# Patient Record
Sex: Male | Born: 1935 | Race: White | Hispanic: No | Marital: Married | Smoking: Former smoker
Health system: Southern US, Community
[De-identification: ages and names within clinical notes are randomized; demographics above are authoritative.]

## PROBLEM LIST (undated history)

## (undated) DIAGNOSIS — E785 Hyperlipidemia, unspecified: Secondary | ICD-10-CM

## (undated) DIAGNOSIS — I639 Cerebral infarction, unspecified: Secondary | ICD-10-CM

## (undated) DIAGNOSIS — I2510 Atherosclerotic heart disease of native coronary artery without angina pectoris: Secondary | ICD-10-CM

## (undated) DIAGNOSIS — J90 Pleural effusion, not elsewhere classified: Secondary | ICD-10-CM

## (undated) DIAGNOSIS — K219 Gastro-esophageal reflux disease without esophagitis: Secondary | ICD-10-CM

## (undated) DIAGNOSIS — I609 Nontraumatic subarachnoid hemorrhage, unspecified: Secondary | ICD-10-CM

## (undated) DIAGNOSIS — I10 Essential (primary) hypertension: Secondary | ICD-10-CM

## (undated) DIAGNOSIS — I739 Peripheral vascular disease, unspecified: Secondary | ICD-10-CM

## (undated) DIAGNOSIS — I480 Paroxysmal atrial fibrillation: Secondary | ICD-10-CM

## (undated) DIAGNOSIS — I679 Cerebrovascular disease, unspecified: Secondary | ICD-10-CM

## (undated) DIAGNOSIS — R001 Bradycardia, unspecified: Secondary | ICD-10-CM

## (undated) HISTORY — DX: Essential (primary) hypertension: I10

## (undated) HISTORY — DX: Cerebrovascular disease, unspecified: I67.9

## (undated) HISTORY — DX: Pleural effusion, not elsewhere classified: J90

## (undated) HISTORY — DX: Cerebral infarction, unspecified: I63.9

## (undated) HISTORY — DX: Atherosclerotic heart disease of native coronary artery without angina pectoris: I25.10

## (undated) HISTORY — DX: Peripheral vascular disease, unspecified: I73.9

## (undated) HISTORY — DX: Gastro-esophageal reflux disease without esophagitis: K21.9

## (undated) HISTORY — DX: Nontraumatic subarachnoid hemorrhage, unspecified: I60.9

## (undated) HISTORY — PX: OTHER SURGICAL HISTORY: SHX169

## (undated) HISTORY — DX: Hyperlipidemia, unspecified: E78.5

---

## 1998-03-01 ENCOUNTER — Ambulatory Visit (HOSPITAL_COMMUNITY): Admission: RE | Admit: 1998-03-01 | Discharge: 1998-03-01 | Payer: Self-pay | Admitting: Cardiology

## 2001-03-24 ENCOUNTER — Ambulatory Visit (HOSPITAL_COMMUNITY): Admission: RE | Admit: 2001-03-24 | Discharge: 2001-03-24 | Payer: Self-pay | Admitting: Gastroenterology

## 2001-08-09 ENCOUNTER — Encounter: Payer: Self-pay | Admitting: Emergency Medicine

## 2001-08-09 ENCOUNTER — Emergency Department (HOSPITAL_COMMUNITY): Admission: EM | Admit: 2001-08-09 | Discharge: 2001-08-09 | Payer: Self-pay | Admitting: Emergency Medicine

## 2001-08-14 ENCOUNTER — Ambulatory Visit (HOSPITAL_COMMUNITY): Admission: RE | Admit: 2001-08-14 | Discharge: 2001-08-14 | Payer: Self-pay | Admitting: Internal Medicine

## 2001-08-14 ENCOUNTER — Encounter: Payer: Self-pay | Admitting: Internal Medicine

## 2004-09-14 HISTORY — PX: OTHER SURGICAL HISTORY: SHX169

## 2004-09-15 ENCOUNTER — Ambulatory Visit: Payer: Self-pay | Admitting: Cardiovascular Disease

## 2004-09-15 ENCOUNTER — Encounter: Payer: Self-pay | Admitting: Emergency Medicine

## 2004-09-15 ENCOUNTER — Inpatient Hospital Stay (HOSPITAL_COMMUNITY): Admission: RE | Admit: 2004-09-15 | Discharge: 2004-09-21 | Payer: Self-pay | Admitting: Neurology

## 2004-09-18 ENCOUNTER — Encounter: Payer: Self-pay | Admitting: Cardiology

## 2004-09-20 ENCOUNTER — Encounter (INDEPENDENT_AMBULATORY_CARE_PROVIDER_SITE_OTHER): Payer: Self-pay | Admitting: Specialist

## 2006-01-25 ENCOUNTER — Ambulatory Visit: Payer: Self-pay | Admitting: Cardiology

## 2006-02-13 ENCOUNTER — Ambulatory Visit: Payer: Self-pay | Admitting: Cardiology

## 2006-02-18 ENCOUNTER — Inpatient Hospital Stay (HOSPITAL_BASED_OUTPATIENT_CLINIC_OR_DEPARTMENT_OTHER): Admission: RE | Admit: 2006-02-18 | Discharge: 2006-02-18 | Payer: Self-pay | Admitting: Cardiovascular Disease

## 2006-02-18 ENCOUNTER — Ambulatory Visit: Payer: Self-pay | Admitting: Cardiovascular Disease

## 2006-02-21 ENCOUNTER — Ambulatory Visit: Payer: Self-pay | Admitting: Cardiology

## 2006-03-04 ENCOUNTER — Ambulatory Visit: Payer: Self-pay

## 2006-03-14 ENCOUNTER — Encounter: Admission: RE | Admit: 2006-03-14 | Discharge: 2006-03-14 | Payer: Self-pay | Admitting: Vascular Surgery

## 2006-03-18 ENCOUNTER — Inpatient Hospital Stay (HOSPITAL_COMMUNITY): Admission: RE | Admit: 2006-03-18 | Discharge: 2006-03-23 | Payer: Self-pay | Admitting: Cardiothoracic Surgery

## 2006-03-18 HISTORY — PX: CORONARY ARTERY BYPASS GRAFT: SHX141

## 2006-04-03 ENCOUNTER — Ambulatory Visit: Payer: Self-pay | Admitting: *Deleted

## 2006-04-23 ENCOUNTER — Ambulatory Visit: Payer: Self-pay | Admitting: Cardiology

## 2006-06-27 ENCOUNTER — Ambulatory Visit: Payer: Self-pay | Admitting: Cardiology

## 2007-01-28 ENCOUNTER — Ambulatory Visit: Payer: Self-pay | Admitting: Cardiology

## 2007-04-04 IMAGING — CR DG CHEST 2V
2 series · 2 of 2 positions shown · non-contrast
Comparison: Portable study 03/20/06.

CLINICAL DATA: Postop CABG.
 CHEST - 2 VIEW:

[w chest pa]
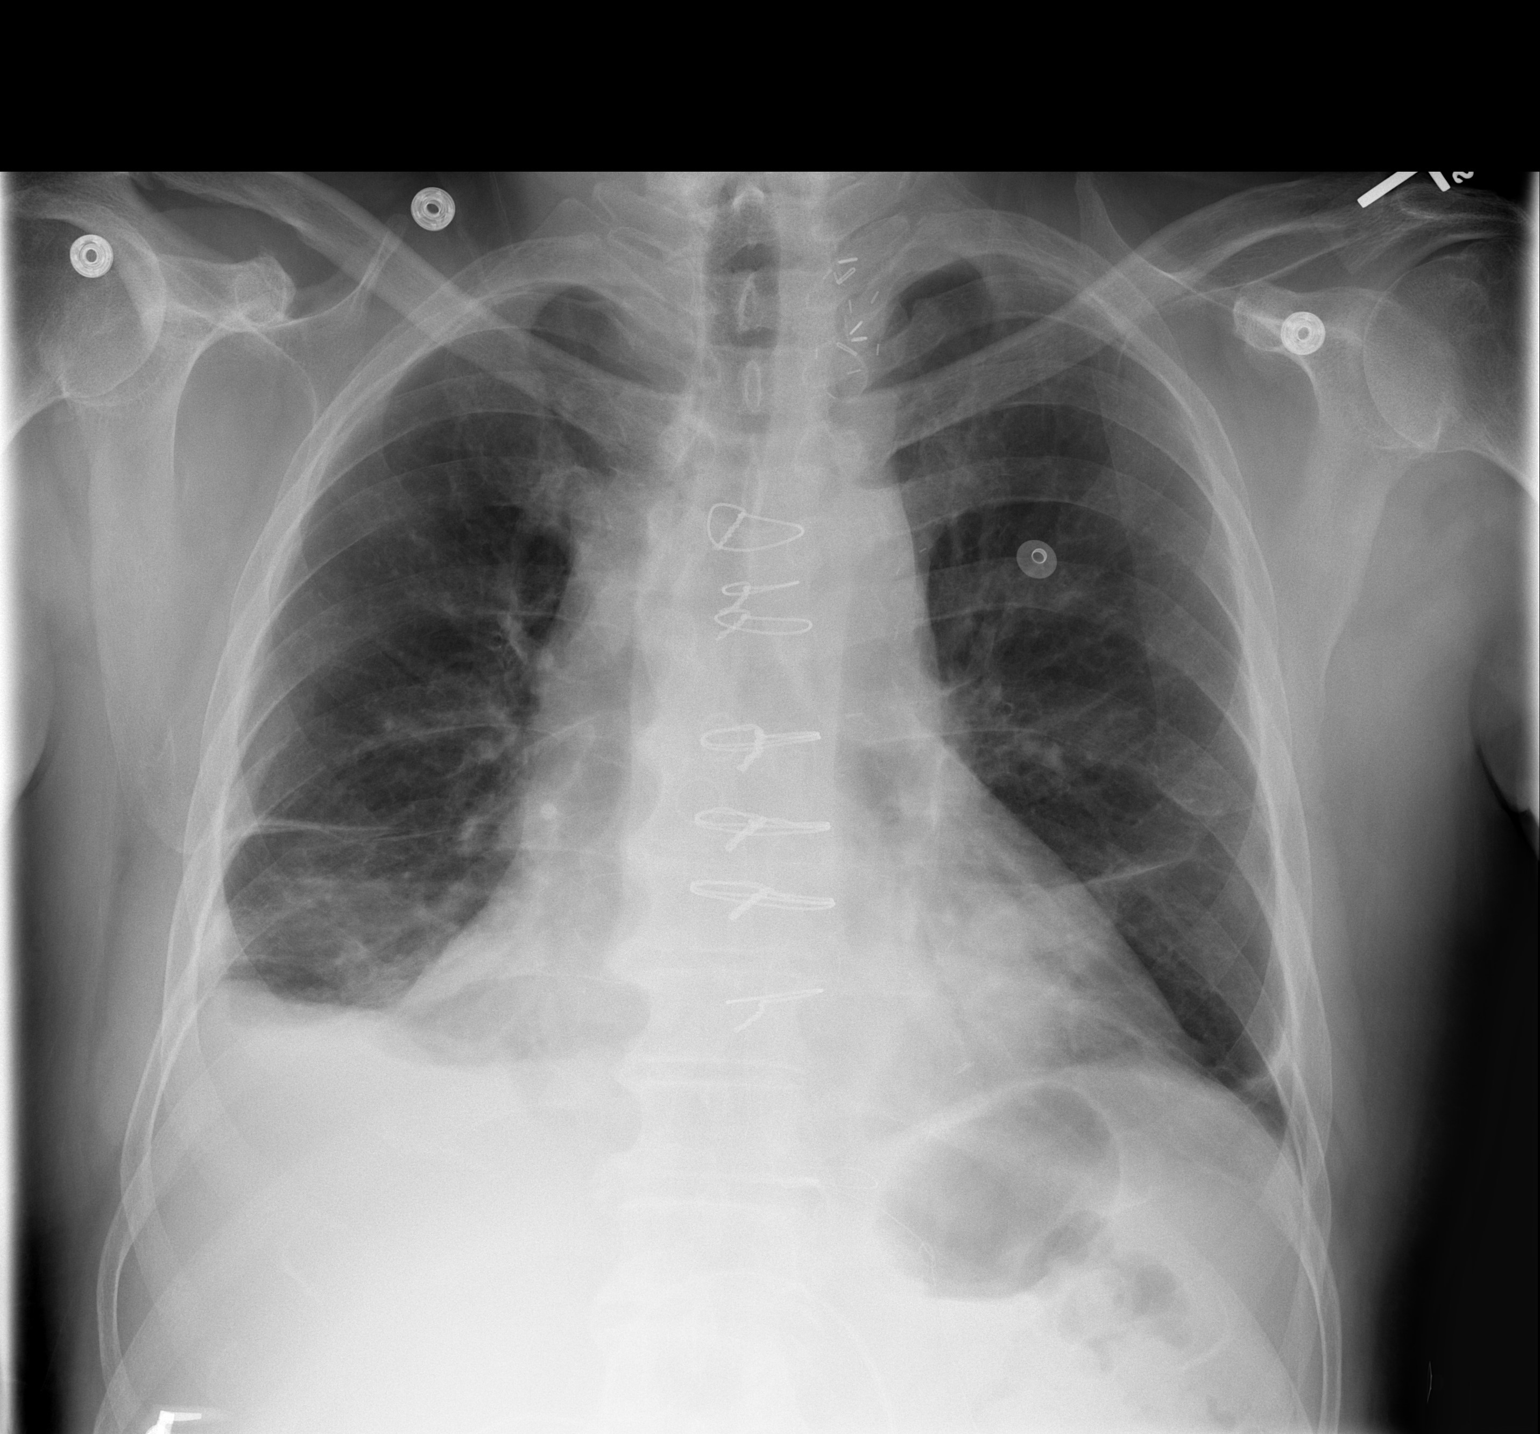

[w chest lat]
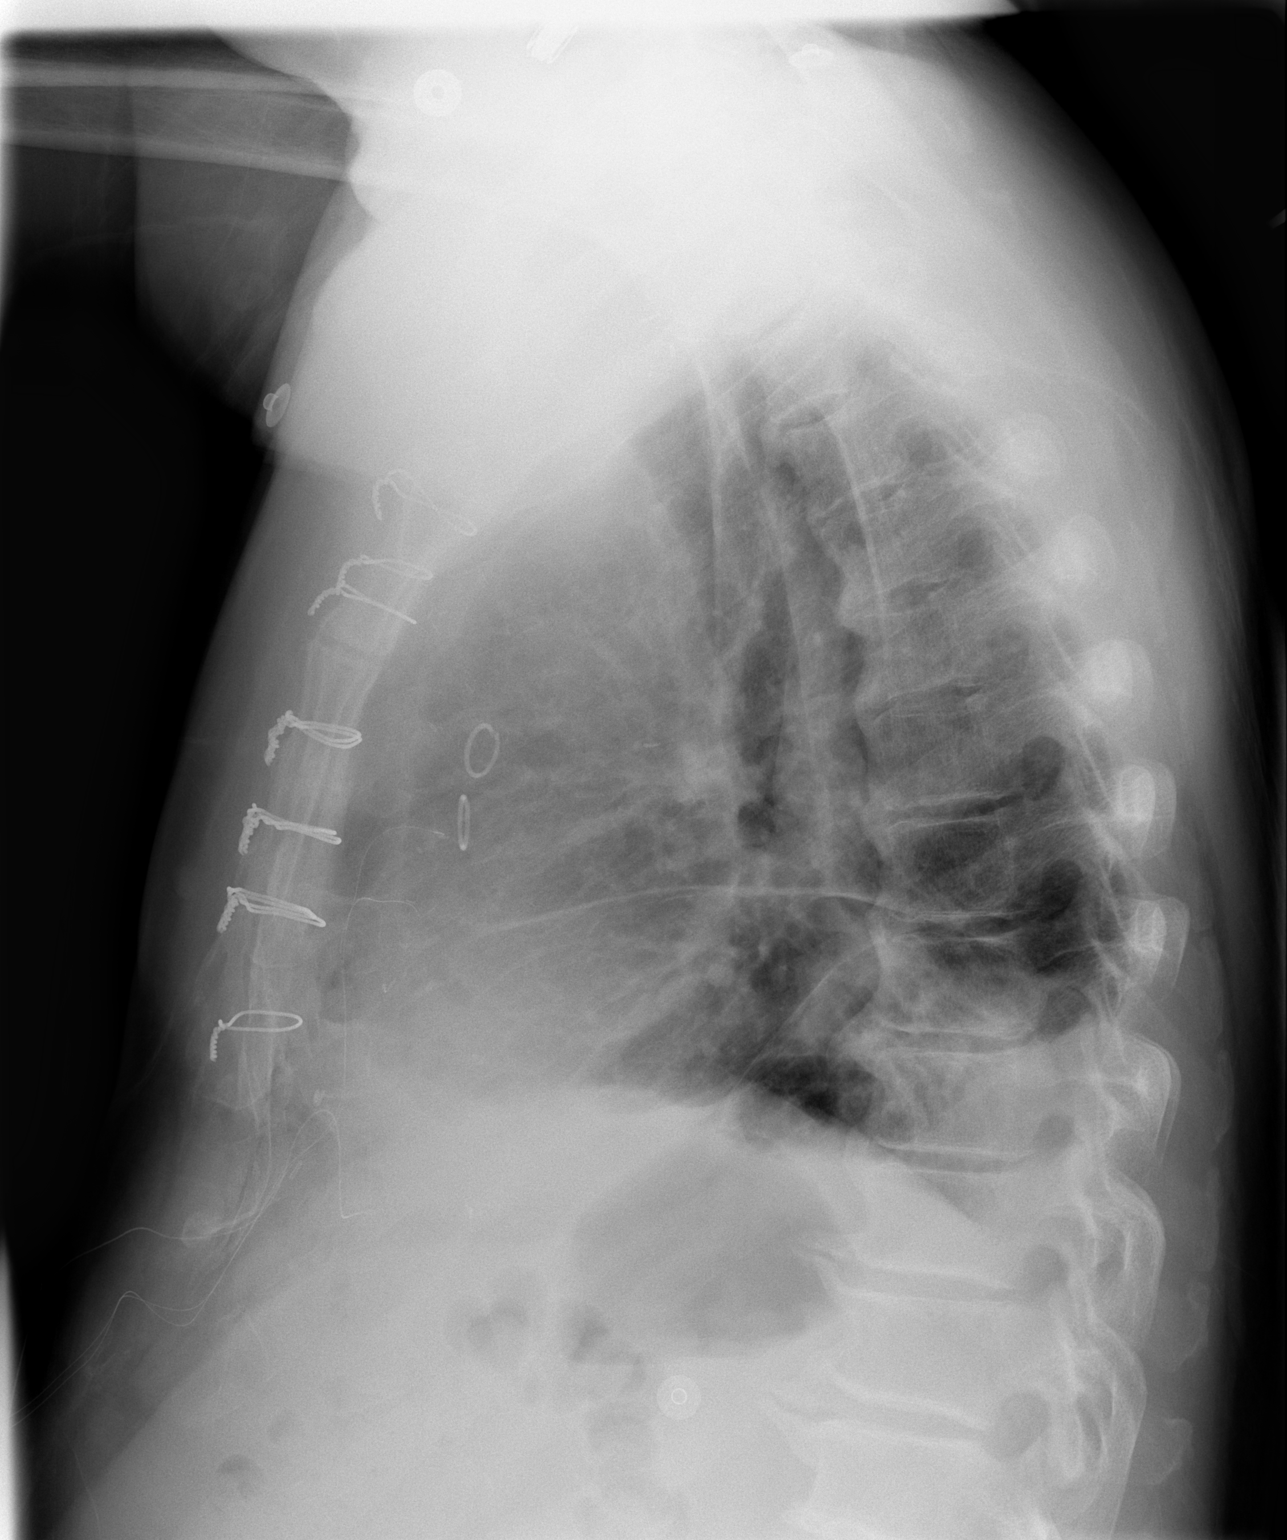

[2 of 2 positions shown; findings below may reference images not displayed]

FINDINGS: Small right pleural effusion and bibasilar atelectasis are redemonstrated.  There is no pneumothorax or edema.  The cardiomediastinal contours appear stable status post recent median sternotomy and CABG.
IMPRESSION: Stable postoperative chest with bibasilar atelectasis and small right pleural effusion.

## 2007-08-14 ENCOUNTER — Ambulatory Visit: Payer: Self-pay | Admitting: Cardiology

## 2007-09-02 ENCOUNTER — Ambulatory Visit: Payer: Self-pay

## 2008-03-05 ENCOUNTER — Ambulatory Visit: Payer: Self-pay | Admitting: Cardiology

## 2008-09-02 ENCOUNTER — Ambulatory Visit: Payer: Self-pay | Admitting: Cardiology

## 2008-09-02 ENCOUNTER — Ambulatory Visit: Payer: Self-pay

## 2008-11-16 ENCOUNTER — Encounter (INDEPENDENT_AMBULATORY_CARE_PROVIDER_SITE_OTHER): Payer: Self-pay | Admitting: Diagnostic Radiology

## 2008-11-16 ENCOUNTER — Ambulatory Visit (HOSPITAL_COMMUNITY): Admission: RE | Admit: 2008-11-16 | Discharge: 2008-11-16 | Payer: Self-pay | Admitting: Internal Medicine

## 2008-11-22 ENCOUNTER — Ambulatory Visit (HOSPITAL_COMMUNITY): Admission: RE | Admit: 2008-11-22 | Discharge: 2008-11-22 | Payer: Self-pay | Admitting: Internal Medicine

## 2008-11-26 ENCOUNTER — Ambulatory Visit (HOSPITAL_COMMUNITY): Admission: RE | Admit: 2008-11-26 | Discharge: 2008-11-26 | Payer: Self-pay | Admitting: Internal Medicine

## 2008-12-01 ENCOUNTER — Ambulatory Visit: Payer: Self-pay | Admitting: Thoracic Surgery

## 2008-12-03 ENCOUNTER — Ambulatory Visit: Admission: RE | Admit: 2008-12-03 | Discharge: 2008-12-03 | Payer: Self-pay | Admitting: Thoracic Surgery

## 2008-12-06 ENCOUNTER — Encounter: Payer: Self-pay | Admitting: Thoracic Surgery

## 2008-12-06 ENCOUNTER — Ambulatory Visit (HOSPITAL_COMMUNITY): Admission: RE | Admit: 2008-12-06 | Discharge: 2008-12-06 | Payer: Self-pay | Admitting: Thoracic Surgery

## 2008-12-06 ENCOUNTER — Ambulatory Visit: Payer: Self-pay | Admitting: Thoracic Surgery

## 2008-12-08 ENCOUNTER — Ambulatory Visit: Payer: Self-pay | Admitting: Internal Medicine

## 2008-12-08 ENCOUNTER — Ambulatory Visit: Payer: Self-pay | Admitting: Thoracic Surgery

## 2008-12-22 ENCOUNTER — Encounter: Payer: Self-pay | Admitting: Cardiology

## 2008-12-22 ENCOUNTER — Ambulatory Visit: Payer: Self-pay | Admitting: Cardiology

## 2008-12-22 ENCOUNTER — Encounter: Admission: RE | Admit: 2008-12-22 | Discharge: 2008-12-22 | Payer: Self-pay | Admitting: Thoracic Surgery

## 2008-12-22 ENCOUNTER — Ambulatory Visit: Payer: Self-pay | Admitting: Thoracic Surgery

## 2009-01-26 ENCOUNTER — Ambulatory Visit: Payer: Self-pay | Admitting: Thoracic Surgery

## 2009-01-26 ENCOUNTER — Encounter: Admission: RE | Admit: 2009-01-26 | Discharge: 2009-01-26 | Payer: Self-pay | Admitting: Thoracic Surgery

## 2009-02-16 ENCOUNTER — Telehealth: Payer: Self-pay | Admitting: Cardiology

## 2009-03-01 ENCOUNTER — Ambulatory Visit: Payer: Self-pay | Admitting: Cardiology

## 2009-03-03 ENCOUNTER — Ambulatory Visit: Payer: Self-pay | Admitting: Internal Medicine

## 2009-03-03 DIAGNOSIS — R0602 Shortness of breath: Secondary | ICD-10-CM | POA: Insufficient documentation

## 2009-03-03 DIAGNOSIS — I440 Atrioventricular block, first degree: Secondary | ICD-10-CM | POA: Insufficient documentation

## 2009-03-03 DIAGNOSIS — I442 Atrioventricular block, complete: Secondary | ICD-10-CM | POA: Insufficient documentation

## 2009-03-11 ENCOUNTER — Ambulatory Visit: Payer: Self-pay | Admitting: Cardiology

## 2009-03-11 ENCOUNTER — Encounter: Payer: Self-pay | Admitting: Internal Medicine

## 2009-03-16 ENCOUNTER — Telehealth: Payer: Self-pay | Admitting: Cardiology

## 2009-03-16 ENCOUNTER — Encounter: Payer: Self-pay | Admitting: Internal Medicine

## 2009-03-23 ENCOUNTER — Ambulatory Visit: Payer: Self-pay | Admitting: Internal Medicine

## 2009-03-23 ENCOUNTER — Ambulatory Visit (HOSPITAL_COMMUNITY): Admission: RE | Admit: 2009-03-23 | Discharge: 2009-03-24 | Payer: Self-pay | Admitting: Internal Medicine

## 2009-03-24 ENCOUNTER — Encounter: Payer: Self-pay | Admitting: Internal Medicine

## 2009-04-08 ENCOUNTER — Ambulatory Visit: Payer: Self-pay | Admitting: Internal Medicine

## 2009-04-20 ENCOUNTER — Telehealth (INDEPENDENT_AMBULATORY_CARE_PROVIDER_SITE_OTHER): Payer: Self-pay

## 2009-04-21 ENCOUNTER — Ambulatory Visit: Payer: Self-pay

## 2009-04-21 ENCOUNTER — Encounter: Payer: Self-pay | Admitting: Internal Medicine

## 2009-05-03 ENCOUNTER — Encounter: Payer: Self-pay | Admitting: Internal Medicine

## 2009-07-08 ENCOUNTER — Ambulatory Visit: Payer: Self-pay | Admitting: Internal Medicine

## 2009-07-08 DIAGNOSIS — J90 Pleural effusion, not elsewhere classified: Secondary | ICD-10-CM | POA: Insufficient documentation

## 2009-07-08 DIAGNOSIS — I609 Nontraumatic subarachnoid hemorrhage, unspecified: Secondary | ICD-10-CM | POA: Insufficient documentation

## 2009-07-08 DIAGNOSIS — I679 Cerebrovascular disease, unspecified: Secondary | ICD-10-CM | POA: Insufficient documentation

## 2009-07-08 DIAGNOSIS — I10 Essential (primary) hypertension: Secondary | ICD-10-CM | POA: Insufficient documentation

## 2009-07-08 DIAGNOSIS — K219 Gastro-esophageal reflux disease without esophagitis: Secondary | ICD-10-CM | POA: Insufficient documentation

## 2009-07-08 DIAGNOSIS — E785 Hyperlipidemia, unspecified: Secondary | ICD-10-CM | POA: Insufficient documentation

## 2009-07-08 DIAGNOSIS — H409 Unspecified glaucoma: Secondary | ICD-10-CM | POA: Insufficient documentation

## 2009-07-08 DIAGNOSIS — I25810 Atherosclerosis of coronary artery bypass graft(s) without angina pectoris: Secondary | ICD-10-CM | POA: Insufficient documentation

## 2009-07-08 DIAGNOSIS — I739 Peripheral vascular disease, unspecified: Secondary | ICD-10-CM | POA: Insufficient documentation

## 2009-11-04 ENCOUNTER — Ambulatory Visit: Payer: Self-pay | Admitting: Internal Medicine

## 2009-11-04 DIAGNOSIS — I4891 Unspecified atrial fibrillation: Secondary | ICD-10-CM | POA: Insufficient documentation

## 2010-01-11 ENCOUNTER — Encounter: Payer: Self-pay | Admitting: Internal Medicine

## 2010-01-12 ENCOUNTER — Telehealth: Payer: Self-pay | Admitting: Internal Medicine

## 2010-01-19 ENCOUNTER — Ambulatory Visit: Payer: Self-pay | Admitting: Internal Medicine

## 2010-01-19 DIAGNOSIS — I5031 Acute diastolic (congestive) heart failure: Secondary | ICD-10-CM | POA: Insufficient documentation

## 2010-02-01 ENCOUNTER — Ambulatory Visit: Payer: Self-pay | Admitting: Thoracic Surgery

## 2010-02-28 ENCOUNTER — Ambulatory Visit: Payer: Self-pay | Admitting: Internal Medicine

## 2010-03-02 ENCOUNTER — Telehealth: Payer: Self-pay | Admitting: Internal Medicine

## 2010-05-30 ENCOUNTER — Telehealth: Payer: Self-pay | Admitting: Internal Medicine

## 2010-06-06 ENCOUNTER — Ambulatory Visit: Payer: Self-pay

## 2010-06-06 ENCOUNTER — Encounter: Payer: Self-pay | Admitting: Internal Medicine

## 2010-09-11 ENCOUNTER — Telehealth: Payer: Self-pay | Admitting: Internal Medicine

## 2010-09-12 ENCOUNTER — Ambulatory Visit: Payer: Self-pay | Admitting: Internal Medicine

## 2010-09-13 ENCOUNTER — Telehealth (INDEPENDENT_AMBULATORY_CARE_PROVIDER_SITE_OTHER): Payer: Self-pay | Admitting: *Deleted

## 2010-09-26 ENCOUNTER — Encounter: Payer: Self-pay | Admitting: Internal Medicine

## 2010-09-26 ENCOUNTER — Ambulatory Visit: Payer: Self-pay

## 2010-10-19 ENCOUNTER — Ambulatory Visit
Admission: RE | Admit: 2010-10-19 | Discharge: 2010-10-19 | Payer: Self-pay | Source: Home / Self Care | Attending: Internal Medicine | Admitting: Internal Medicine

## 2010-11-14 NOTE — Assessment & Plan Note (Signed)
Summary: cp-mainly w/activity   Visit Type:  PPM-[employer] Primary Provider:  dr Marveen Reeks  CC:  no complaints.  History of Present Illness: Frank Weeks is seen in followup for coronary artery disease prior bypass surgery and intermittent angina. Most recently he had acute diastolic heart failure related to inappropriate reversion of his pacemaker from the DDD mode to the VVI mode; this had to be adjusted with computer rebooting  He continues to have exertional chest discomfort.this is largely with exertion but not reproducibly so. Has also occasionally accompanied by lightheadedness. He can be aggravated by activity of his upper arms.   a Myoview in 2009demonstrated EF:  56 % QGS cine images:  Normal wall motion.: Abnormal adenosine nuclear study with prior inferolateral infarct at the base and mild peri-infarct ischemia.   Echocardiogram 2010 demonstrated normal left ventricular function      Problems Prior to Update: 1)  Diastolic Heart Failure, Acute  (ICD-428.31) 2)  Atrial Fibrillation  (ICD-427.31) 3)  Cad, Artery Bypass Graft  (ICD-414.04) 4)  Pacemaker, Permanent  (ICD-V45.01) 5)  Peripheral Vascular Disease  (ICD-443.9) 6)  Hypertension, Unspecified  (ICD-401.9) 7)  Dyslipidemia  (ICD-272.4) 8)  Subarachnoid Hemorrhage  (ICD-430) 9)  Cerebrovascular Disease  (ICD-437.9) 10)  Gastroesophageal Reflux Disease  (ICD-530.81) 11)  Second Degree Av Block, Mobitz II  (ICD-426.12) 12)  Av Block, 1st Degree  (ICD-426.11) 13)  Shortness of Breath  (ICD-786.05) 14)  Glaucoma  (ICD-365.9) 15)  Pleural Effusion  (ICD-511.9)  Current Medications (verified): 1)  Aspirin Ec 325 Mg Tbec (Aspirin) .... Take One Tablet By Mouth Daily 2)  Crestor 20 Mg Tabs (Rosuvastatin Calcium) .... Take One Tablet Three Times A Week 3)  Fish Oil  Oil (Fish Oil) .... Take As Directed 4)  Nitroglycerin 0.4 Mg Subl (Nitroglycerin) .... One Tablet Under Tongue Every 5 Minutes As Needed For Chest Pain---May  Repeat Times Three 5)  Altace 10 Mg Tabs (Ramipril) .... Take One Tablet Once Daily 6)  Travatan 0.004 % Soln (Travoprost) .Marland Kitchen.. 1 Gtt Ou Once Daily 7)  Xibrom 0.09 % Soln (Bromfenac Sodium) .Marland Kitchen.. 1 Gtt Two Times A Day Ou 8)  Vitamin B-12 100 Mcg Tabs (Cyanocobalamin) .... Once Daily  Allergies (verified): 1)  ! Lipitor (Atorvastatin)  Past History:  Past Medical History: Last updated: 2009/08/03 Current Problems:  CAD, ARTERY BYPASS GRAFT (ICD-414.04)-1977 ISCHEMIC HEART DISEASE (ICD-414.9) PACEMAKER, PERMANENT (ICD-V45.01) Dual-chamber PERIPHERAL VASCULAR DISEASE (ICD-443.9) HYPERTENSION, UNSPECIFIED (ICD-401.9) DYSLIPIDEMIA (ICD-272.4) SUBARACHNOID HEMORRHAGE (ICD-430)HX OF CEREBROVASCULAR DISEASE (ICD-437.9)Extracranial GASTROESOPHAGEAL REFLUX DISEASE (ICD-530.81) SECOND DEGREE AV BLOCK, MOBITZ II (ICD-426.12) AV BLOCK, 1ST DEGREE (ICD-426.11) SHORTNESS OF BREATH (ICD-786.05)  History of stroke in 1988. GLAUCOMA (ICD-365.9) PLEURAL EFFUSION (ICD-511.9)  Past Surgical History: Last updated: 08/03/2009  coronary artery   bypass graft surgery, March 18, 2006.   right carotid endarterectomy, December 2005.   left carotid to vertebral bypass   Dual-chamber pacemaker implantation.   thoracentesis and bronchoscopy   Family History: Last updated: 08-03-09  His mother died at 71 from myocardial infarction and had  lung cancer.  His father died at 69 from myocardial infarction.  He has a brother, 59.  He has a twin brother, 14, who has a heart condition.  There is positive family history of tremor.  Social History: Last updated: 03-Aug-2009  He is retired from working in [employer].  He does not drink alcohol nor does he smoke cigarettes.  Vital Signs:  Patient profile:   77 year old male Height:  69 inches Weight:      178 pounds BMI:     26.38 Pulse rate:   76 / minute BP sitting:   163 / 84  (right arm) Cuff size:   regular  Vitals Entered By: Caralee Ates CMA  (September 12, 2010 12:11 PM)  Physical Exam  General:  The patient was alert and oriented in no acute distress. HEENT Normal.  Neck veins were flat, carotids were brisk.  Lungs were clear.  Heart sounds were regular with 2/6 systolic murmur heard both at the apex and at the base Abdomen was soft with active bowel sounds. There is no clubbing cyanosis or edema. Skin Warm and dry alert and oriented    PPM Specifications Following MD:  Sherryl Manges, MD     PPM Vendor:  [employer]     PPM Model Number:  [id number]     PPM Serial Number:  [id number] PPM DOI:  03/23/2009     PPM Implanting MD:  Sherryl Manges, MD  Lead 1    Location: RA     DOI: 03/23/2009     Model #: [id number]     Serial #: [id number]     Status: active Lead 2    Location: RV     DOI: 03/23/2009     Model #: [id number]     Serial #: [id number]     Status: active  Magnet Response Rate:  BOL 100 ERI  85    PPM Follow Up Remote Check?  Yes Battery Voltage:  2.95 V     Battery Est. Longevity:  7.1 years     Pacer Dependent:  Yes       PPM Device Measurements Atrium  Amplitude: 5.0 mV, Impedance: 450 ohms,  Right Ventricle  Amplitude: 10.5 mV, Impedance: 530 ohms, Threshold: 0.75 V at 0.4 msec  Episodes MS Episodes:  1947     Percent Mode Switch:  7.7%     Coumadin:  No Ventricular High Rate:  15%     Atrial Pacing:  100%      Parameters Mode:  DDD     Lower Rate Limit:  60     Upper Rate Limit:  120 Paced AV Delay:  200     Sensed AV Delay:  180 Next Cardiology Appt Due:  02/13/2011 Tech Comments:  No parameter changes.  A-fib with ventricular rates controlled, - coumadin.  ROV 6 months clinic. Altha Harm, LPN  September 12, 2010 12:28 PM   Impression & Recommendations:  Problem # 1:  ATRIAL FIBRILLATION (ICD-427.31) the patient has paroxysms of atrial fibrillation. It is possible that these correlate with his symptoms. We'll undertake an event recorder to try to clarify this.  He has a high CHADS VASC score, but has a history  of a subarachnoid hemorrhage and GI bleeding; hence he is not a candidate for anticoagulative therapy. His updated medication list for this problem includes:    Aspirin Ec 325 Mg Tbec (Aspirin) .Marland Kitchen... Take one tablet by mouth daily  Problem # 2:  CAD, ARTERY BYPASS GRAFT (ICD-414.04)  he is a chronic chest pain following his bypass surgery. It is possible that as this is largely exertional that there is a true ischemic component. I suspect more likely that it is a mechanical think that's related to chest wall scarring. I've asked him to consider whether he would be willing to undergo catheterization; we will review that at his next visit His updated medication list for this problem includes:  Aspirin Ec 325 Mg Tbec (Aspirin) .Marland Kitchen... Take one tablet by mouth daily    Nitroglycerin 0.4 Mg Subl (Nitroglycerin) ..... One tablet under tongue every 5 minutes as needed for chest pain---may repeat times three    Altace 10 Mg Tabs (Ramipril) .Marland Kitchen... Take one tablet once daily  His updated medication list for this problem includes:    Aspirin Ec 325 Mg Tbec (Aspirin) .Marland Kitchen... Take one tablet by mouth daily    Nitroglycerin 0.4 Mg Subl (Nitroglycerin) ..... One tablet under tongue every 5 minutes as needed for chest pain---may repeat times three    Altace 10 Mg Tabs (Ramipril) .Marland Kitchen... Take one tablet once daily  Problem # 3:  PACEMAKER, PERMANENT (ICD-V45.01) Device parameters and data were reviewed and no changes were made  Problem # 4:  AV BLOCK, 1ST DEGREE (ICD-426.11)  at this point he is ventricularly paced 100% of the time. RA excursion seemed quite adequate His updated medication list for this problem includes:    Aspirin Ec 325 Mg Tbec (Aspirin) .Marland Kitchen... Take one tablet by mouth daily    Nitroglycerin 0.4 Mg Subl (Nitroglycerin) ..... One tablet under tongue every 5 minutes as needed for chest pain---may repeat times three    Altace 10 Mg Tabs (Ramipril) .Marland Kitchen... Take one tablet once daily  Orders: Event  (Event)  His updated medication list for this problem includes:    Aspirin Ec 325 Mg Tbec (Aspirin) .Marland Kitchen... Take one tablet by mouth daily    Nitroglycerin 0.4 Mg Subl (Nitroglycerin) ..... One tablet under tongue every 5 minutes as needed for chest pain---may repeat times three    Altace 10 Mg Tabs (Ramipril) .Marland Kitchen... Take one tablet once daily  Patient Instructions: 1)  Your physician recommends that you schedule a follow-up appointment in: 5 weeks 2)  Your physician recommends that you continue on your current medications as directed. Please refer to the Current Medication list given to you today. 3)  Your physician has recommended that you wear an event monitor.  Event monitors are medical devices that record the heart's electrical activity. Doctors most often use these monitors to diagnose arrhythmias. Arrhythmias are problems with the speed or rhythm of the heartbeat. The monitor is a small, portable device. You can wear one while you do your normal daily activities. This is usually used to diagnose what is causing palpitations/syncope (passing out).

## 2010-11-14 NOTE — Cardiovascular Report (Signed)
Summary: Office Visit   Office Visit   Imported By: Roderic Ovens 06/27/2010 12:44:14  _____________________________________________________________________  External Attachment:    Type:   Image     Comment:   External Document

## 2010-11-14 NOTE — Progress Notes (Signed)
Summary: does have box to check pacer at home/pt called again  Phone Note Outgoing Call Call back at [facility] Phone [phone]   Caller: Patient Reason for Call: Talk to Nurse Summary of Call: per pt caling, does have a box to check his pacer at home.  Initial call taken by: Lorne Skeens,  May 30, 2010 12:12 PM Summary of Call: spoke w/pt and prefers at this time to come in office for device check.  Scheduled pt for 06-06-10 @ 1030.  pt needed tues appt. Vella Kohler  May 30, 2010 3:34 PM  Follow-up for Phone Call        pt needs someone to call him he will not be able to do transmission because he dosen't have a box to send it and he wants to talk to someone

## 2010-11-14 NOTE — Procedures (Signed)
Summary: Cardiology Device Clinic   Allergies: 1)  ! Lipitor (Atorvastatin)  PPM Specifications Following MD:  Sherryl Manges, MD     PPM Vendor:  [employer]     PPM Model Number:  [id number]     PPM Serial Number:  [id number] PPM DOI:  03/23/2009     PPM Implanting MD:  Sherryl Manges, MD  Lead 1    Location: RA     DOI: 03/23/2009     Model #: [id number]     Serial #: [id number]     Status: active Lead 2    Location: RV     DOI: 03/23/2009     Model #: [id number]     Serial #: [id number]     Status: active  Magnet Response Rate:  BOL 100 ERI  85    PPM Follow Up Remote Check?  No Battery Voltage:  2.96 V     Battery Est. Longevity:  8.4 years     Pacer Dependent:  No       PPM Device Measurements Atrium  Amplitude: 4.1 mV, Impedance: 430 ohms, Threshold: 1.0 V at 0.4 msec Right Ventricle  Amplitude: 10.8 mV, Impedance: 490 ohms, Threshold: 0.5 V at 0.4 msec  Episodes Coumadin:  No  Parameters Mode:  DDD     Lower Rate Limit:  60     Upper Rate Limit:  120 Paced AV Delay:  200     Sensed AV Delay:  180 Next Cardiology Appt Due:  03/15/2010 Tech Comments:  Checked by [employer].  AF suppreession on, max rate 120.  ROV 6/11 with Dr. Graciela Husbands. Altha Harm, LPN  November 04, 2009 12:08 PM    Patient Instructions: 1)  Your physician recommends that you schedule a follow-up appointment in: 03/25/2010

## 2010-11-14 NOTE — Progress Notes (Signed)
Summary: Pt having problems with heart all pt wife would say  Phone Note Call from Patient Call back at Home Phone [phone]   Caller: Spouse/ Frank Weeks Summary of Call: Pt having moore problems with heart thats all wife would say Initial call taken by: Judie Grieve,  September 11, 2010 2:04 PM  Follow-up for Phone Call        11/28 1432-rna at pt home. Frank Gladden, RN, BSN spoke w/pt wife and she states that he is having chest pain more often and having to take nitro.  usually one does it.  she is unable to pinpoint how often it happens-could be several times in a week or could skip a week. she felt that the problem happens with activity.  she and he are insistent on seeing Dr. Graciela Husbands for the problem.  She also states that he has not felt well recently.  Adv spouse that if pain unrelieved by nitro x 3 to call 911. She expressed understanding.  Worked pt in tomorrow at noon. Follow-up by: Frank Gladden RN,  September 11, 2010 4:11 PM

## 2010-11-14 NOTE — Cardiovascular Report (Signed)
Summary: Office Visit   Office Visit   Imported By: Roderic Ovens 03/07/2010 16:01:01  _____________________________________________________________________  External Attachment:    Type:   Image     Comment:   External Document

## 2010-11-14 NOTE — Assessment & Plan Note (Signed)
Summary: 1030/sjm pacer check  needed tues appt   Current Medications (verified): 1)  Aspirin Ec 325 Mg Tbec (Aspirin) .... Take One Tablet By Mouth Daily 2)  Crestor 20 Mg Tabs (Rosuvastatin Calcium) .... Take One Tablet Three Times A Week 3)  Fish Oil  Oil (Fish Oil) .... Take As Directed 4)  Nitroglycerin 0.4 Mg Subl (Nitroglycerin) .... One Tablet Under Tongue Every 5 Minutes As Needed For Chest Pain---May Repeat Times Three 5)  Altace 10 Mg Tabs (Ramipril) .... Take One Tablet Once Daily 6)  Travatan 0.004 % Soln (Travoprost) .Marland Kitchen.. 1 Gtt Ou Once Daily 7)  Xibrom 0.09 % Soln (Bromfenac Sodium) .Marland Kitchen.. 1 Gtt Two Times A Day Ou 8)  Vitamin B-12 100 Mcg Tabs (Cyanocobalamin) .... Once Daily  Allergies (verified): 1)  ! Lipitor (Atorvastatin)   PPM Specifications Following MD:  Sherryl Manges, MD     PPM Vendor:  [employer]     PPM Model Number:  [id number]     PPM Serial Number:  [id number] PPM DOI:  03/23/2009     PPM Implanting MD:  Sherryl Manges, MD  Lead 1    Location: RA     DOI: 03/23/2009     Model #: [id number]     Serial #: [id number]     Status: active Lead 2    Location: RV     DOI: 03/23/2009     Model #: [id number]     Serial #: [id number]     Status: active  Magnet Response Rate:  BOL 100 ERI  85    PPM Follow Up Battery Voltage:  2.95 V     Battery Est. Longevity:  7 yrs     Pacer Dependent:  No       PPM Device Measurements Atrium  Amplitude: 5.0 mV, Impedance: 400 ohms, Threshold: 1.0 V at 0.4 msec Right Ventricle  Amplitude: 10.5 mV, Impedance: 540 ohms, Threshold: 0.75 V at 0.4 msec  Episodes MS Episodes:  1202     Percent Mode Switch:  4.4%     Coumadin:  No Ventricular High Rate:  0     Atrial Pacing:  13%     Ventricular Pacing:  >99%  Parameters Mode:  DDD     Lower Rate Limit:  60     Upper Rate Limit:  120 Paced AV Delay:  200     Sensed AV Delay:  180 Next Cardiology Appt Due:  11/15/2010 Tech Comments:  1202 AMS EPISODES--4.4% OF TIME. - COUMADIN.  NORMAL DEVICE FUNCTION.   NO CHANGES MADE. ROV IN 6 MTHS W/SK. Vella Kohler  June 06, 2010 10:45 AM

## 2010-11-14 NOTE — Progress Notes (Signed)
Summary: holter monitor  Phone Note Call from Patient Call back at Home Phone [phone]   Caller: Patient Reason for Call: Talk to Nurse Summary of Call: pt does not want to do the holter monitor Initial call taken by: Migdalia Dk,  Mar 02, 2010 8:58 AM  Follow-up for Phone Call        fine Follow-up by: Nathen May, MD, [facility],  Mar 08, 2010 5:35 PM

## 2010-11-14 NOTE — Progress Notes (Signed)
Summary: fluid build up  Phone Note Call from Patient Call back at [facility] Phone [phone]   Caller: Spouse Reason for Call: Talk to Nurse Summary of Call: request to speak to nurse, PCP found fluid around his heart, thinks he should be Initial call taken by: Migdalia Dk,  January 12, 2010 4:22 PM  Follow-up for Phone Call        Spoke with pt's wife. She would like to make an appointmet for pt. to see Dr. Graciela Husbands. Pt has fluid around his heart according to his PCP. An appointment was made for April 7th at 3:15 PM. Pt's wife aware. Follow-up by: Ollen Gross, RN, BSN,  January 12, 2010 5:10 PM

## 2010-11-14 NOTE — Progress Notes (Signed)
Summary: holter monitor  Phone Note Outgoing Call   Call placed by: Marcos Eke,  September 13, 2010 11:08 AM Summary of Call: Call pt for appt.for monitor ,left messege for him to call to sch.  Follow-up for Phone Call        Frank Weeks call back and said he would like to wait until he see Dr Graciela Husbands on 10/19/10 before he have this monitor put on. Follow-up by: Marcos Eke,  September 15, 2010 8:48 AM     Appended Document: holter monitor spoke w/pt and explained why Dr. Graciela Husbands wanted the monitor on and he is agreeable. Confirmed w/Ruth that monitor available and left msg for pt to call in to set up time to get monitor put on. Claris Gladden, RN, BSN

## 2010-11-14 NOTE — Assessment & Plan Note (Signed)
Summary: f6w   Primary Provider:  dr Marveen Reeks  CC:  6 week follow up.  Marland Kitchen  History of Present Illness: Frank Weeks is seen in followup for coronary artery disease prior bypass surgery and intermittent angina. Most recently he had acute diastolic heart failure related to inappropriate reversion of his pacemaker from the DDD mode to the VVI mode; this had to be adjusted with computer rebooting  He continues to have exertional chest discomfort. Catheterization  in 2007 after he which he underwent bypass grafting x3.  a Myoview in 2009demonstrated EF:  56 % QGS cine images:  Normal wall motion.: Abnormal adenosine nuclear study with prior inferolateral infarct at the base and mild peri-infarct ischemia.   Echocardiogram 2010 demonstrated normal left ventricular function    He is much improved following reprogramming of his device. There have been no palpitations or shortness of breath    Current Medications (verified): 1)  Aspirin Ec 325 Mg Tbec (Aspirin) .... Take One Tablet By Mouth Daily 2)  Crestor 20 Mg Tabs (Rosuvastatin Calcium) .... Take One Tablet Three Times A Week 3)  Fish Oil  Oil (Fish Oil) .... Take As Directed 4)  Nitroglycerin 0.4 Mg Subl (Nitroglycerin) .... One Tablet Under Tongue Every 5 Minutes As Needed For Chest Pain---May Repeat Times Three 5)  Altace 10 Mg Tabs (Ramipril) .... Take One Tablet Once Daily 6)  Travatan 0.004 % Soln (Travoprost) .Marland Kitchen.. 1 Gtt Ou Once Daily 7)  Xibrom 0.09 % Soln (Bromfenac Sodium) .Marland Kitchen.. 1 Gtt Two Times A Day Ou 8)  Vitamin D 1000 Unit Tabs (Cholecalciferol) .... Once Daily 9)  Vitamin B-12 100 Mcg Tabs (Cyanocobalamin) .... Once Daily  Allergies (verified): 1)  ! Lipitor (Atorvastatin)  Past History:  Past Medical History: Last updated: July 24, 2009 Current Problems:  CAD, ARTERY BYPASS GRAFT (ICD-414.04)-1977 ISCHEMIC HEART DISEASE (ICD-414.9) PACEMAKER, PERMANENT (ICD-V45.01) Dual-chamber PERIPHERAL VASCULAR DISEASE  (ICD-443.9) HYPERTENSION, UNSPECIFIED (ICD-401.9) DYSLIPIDEMIA (ICD-272.4) SUBARACHNOID HEMORRHAGE (ICD-430)HX OF CEREBROVASCULAR DISEASE (ICD-437.9)Extracranial GASTROESOPHAGEAL REFLUX DISEASE (ICD-530.81) SECOND DEGREE AV BLOCK, MOBITZ II (ICD-426.12) AV BLOCK, 1ST DEGREE (ICD-426.11) SHORTNESS OF BREATH (ICD-786.05)  History of stroke in 1988. GLAUCOMA (ICD-365.9) PLEURAL EFFUSION (ICD-511.9)  Past Surgical History: Last updated: 24-Jul-2009  coronary artery   bypass graft surgery, March 18, 2006.   right carotid endarterectomy, December 2005.   left carotid to vertebral bypass   Dual-chamber pacemaker implantation.   thoracentesis and bronchoscopy   Family History: Last updated: 07/24/2009  His mother died at 71 from myocardial infarction and had  lung cancer.  His father died at 30 from myocardial infarction.  He has a brother, 21.  He has a twin brother, 63, who has a heart condition.  There is positive family history of tremor.  Social History: Last updated: 2009/07/24  He is retired from working in [employer].  He does not drink alcohol nor does he smoke cigarettes.  Vital Signs:  Patient profile:   77 year old male Height:      69 inches Weight:      173 pounds BMI:     25.64 Pulse rate:   84 / minute Pulse rhythm:   regular BP sitting:   126 / 80  (left arm) Cuff size:   regular  Vitals Entered By: Judithe Modest CMA (Feb 28, 2010 2:27 PM)  Physical Exam  General:  The patient was alert and oriented in no acute distress. HEENT Normal.  Neck veins were flat, carotids were brisk.  Lungs were clear.  Heart sounds were regular  with 2/6 systolic murmur heard both at the apex and at the base Abdomen was soft with active bowel sounds. There is no clubbing cyanosis or edema. Skin Warm and dry alert and oriented    PPM Specifications Following MD:  Sherryl Manges, MD     PPM Vendor:  [employer] Jude     PPM Model Number:  [id number]     PPM Serial Number:  [id number] DOI:   03/23/2009     PPM Implanting MD:  Sherryl Manges, MD  Lead 1    Location: [facility]     DOI: 03/23/2009     Model #: [id number]     Serial #: [id number]     Status: active Lead 2    Location: [facility]     DOI: 03/23/2009     Model #: [id number]     Serial #: [id number]     Status: active  Magnet Response Rate:  BOL 100 ERI  85    PPM Follow Up Battery Voltage:  2.96 V     Battery Est. Longevity:  7.7 yrs     Pacer Dependent:  No       PPM Device Measurements Atrium  Amplitude: 5.0 mV, Impedance: 410 ohms, Threshold: 1.0 V at 0.4 msec Right Ventricle  Amplitude: 11.4 mV, Impedance: 490 ohms, Threshold: 0.75 V at 0.4 msec  Episodes MS Episodes:  469     Percent Mode Switch:  2.7%     Coumadin:  No Ventricular High Rate:  0     Atrial Pacing:  6.1%     Ventricular Pacing:  99%  Parameters Mode:  DDD     Lower Rate Limit:  60     Upper Rate Limit:  120 Paced AV Delay:  200     Sensed AV Delay:  180 Next Cardiology Appt Due:  08/15/2010 Tech Comments:  LONGEST MODE SWITCH 2 HRS 33 MINUTES.  NORMAL DEVICE FUNCTION. CHANGED A AMPLITUDE TO 2 V. PT IS NOT ON COUMADIN.  Vella Kohler  Feb 28, 2010 2:48 PM  Impression & Recommendations:  Problem # 1:  PACEMAKER, PERMANENT (ICD-V45.01) Device parameters and data were reviewed and no changes were made  Problem # 2:  ATRIAL FIBRILLATION (ICD-427.31) the device continues to take atrial fibrillation the longest episode which is one hour or so. We will continue to monitor it at this time continue him on aspirin for thrombolic risk reduction His updated medication list for this problem includes:    Aspirin Ec 325 Mg Tbec (Aspirin) .Marland Kitchen... Take one tablet by mouth daily  Problem # 3:  CAD, ARTERY BYPASS GRAFT (ICD-414.04) He continues to have exertional chest discomfort. I suggested that he take pre-exercise nitroglycerin His updated medication list for this problem includes:    Aspirin Ec 325 Mg Tbec (Aspirin) .Marland Kitchen... Take one tablet by mouth daily    Nitroglycerin  0.4 Mg Subl (Nitroglycerin) ..... One tablet under tongue every 5 minutes as needed for chest pain---may repeat times three    Altace 10 Mg Tabs (Ramipril) .Marland Kitchen... Take one tablet once daily  Problem # 4:  DIASTOLIC HEART FAILURE, ACUTE (ICD-428.31) relatively stable on current medication His updated medication list for this problem includes:    Aspirin Ec 325 Mg Tbec (Aspirin) .Marland Kitchen... Take one tablet by mouth daily    Nitroglycerin 0.4 Mg Subl (Nitroglycerin) ..... One tablet under tongue every 5 minutes as needed for chest pain---may repeat times three    Altace 10 Mg Tabs (Ramipril) .Marland Kitchen... Take  one tablet once daily  Patient Instructions: 1)  Your physician wants you to follow-up in: 6 months with device clinic.  You will receive a reminder letter in the mail two months in advance. If you don't receive a letter, please call our office to schedule the follow-up appointment. 2)  Your physician recommends that you continue on your current medications as directed. Please refer to the Current Medication list given to you today.

## 2010-11-14 NOTE — Assessment & Plan Note (Signed)
Summary: Fluid around heart per PCP   Primary Provider:  dr Marveen Reeks  CC:  fluid around heart per PCP. Pt also has pneumonia.  Frank Weeks  History of Present Illness: Frank Weeks is seen in followup for coronary artery disease with prior bypass and chronic intermittent angina. He was also felt to have symptomatic second-degree heart block and bradycardia and for this he underwent pacemaker implantation earlier this year. This has been associated with only a modest improvement. Echocardiogram 2010 demonstrated normal left ventricular function previous three-vessel disease by catheter in 2007 and underwent bypass.  He comes in today at the request of Dr. Sherrye Payor because of progressive and rapidly so penis weakness accompanied by dyspnea on exertion and exercise intolerance. Blood testing had demonstrated an elevated BNP; chest x-ray had raised the possibility of heart failure versus pneumonia there were some chills which seem to corroborate the latter diagnosis.   H        Current Medications (verified): 1)  Aspirin Ec 325 Mg Tbec (Aspirin) .... Take One Tablet By Mouth Daily 2)  Crestor 20 Mg Tabs (Rosuvastatin Calcium) .Frank Weeks.. 1 Tab Qd 3)  Fish Oil  Oil (Fish Oil) .... Take As Directed 4)  Nitroglycerin 0.4 Mg Subl (Nitroglycerin) .... One Tablet Under Tongue Every 5 Minutes As Needed For Chest Pain---May Repeat Times Three 5)  Altace 10 Mg Tabs (Ramipril) .... Take One Tablet Once Daily 6)  Travatan 0.004 % Soln (Travoprost) .Frank Weeks.. 1 Gtt Ou Once Daily 7)  Xibrom 0.09 % Soln (Bromfenac Sodium) .Frank Weeks.. 1 Gtt Two Times A Day Ou 8)  Avelox 400 Mg Tabs (Moxifloxacin Hcl) .... Take One Tablet Daily For Seven Days 9)  Align  Caps (Probiotic Product) .... Tablet Everyday 10)  Proventil Hfa 108 (90 Base) Mcg/act Aers (Albuterol Sulfate) .... Use 2 Puffs A Day Every 4 Hours 11)  Asmanex 30 Metered Doses 220 Mcg/inh Aepb (Mometasone Furoate) .... Uad  Allergies (verified): 1)  ! Lipitor (Atorvastatin)  Past  History:  Past Medical History: Last updated: Jul 26, 2009 Current Problems:  CAD, ARTERY BYPASS GRAFT (ICD-414.04)-1977 ISCHEMIC HEART DISEASE (ICD-414.9) PACEMAKER, PERMANENT (ICD-V45.01) Dual-chamber PERIPHERAL VASCULAR DISEASE (ICD-443.9) HYPERTENSION, UNSPECIFIED (ICD-401.9) DYSLIPIDEMIA (ICD-272.4) SUBARACHNOID HEMORRHAGE (ICD-430)HX OF CEREBROVASCULAR DISEASE (ICD-437.9)Extracranial GASTROESOPHAGEAL REFLUX DISEASE (ICD-530.81) SECOND DEGREE AV BLOCK, MOBITZ II (ICD-426.12) AV BLOCK, 1ST DEGREE (ICD-426.11) SHORTNESS OF BREATH (ICD-786.05)  History of stroke in 1988. GLAUCOMA (ICD-365.9) PLEURAL EFFUSION (ICD-511.9)  Past Surgical History: Last updated: 07/26/2009  coronary artery   bypass graft surgery, March 18, 2006.   right carotid endarterectomy, December 2005.   left carotid to vertebral bypass   Dual-chamber pacemaker implantation.   thoracentesis and bronchoscopy   Family History: Last updated: 26-Jul-2009  His mother died at 15 from myocardial infarction and had  lung cancer.  His father died at 85 from myocardial infarction.  He has a brother, 50.  He has a twin brother, 26, who has a heart condition.  There is positive family history of tremor.  Social History: Last updated: Jul 26, 2009  He is retired from working in [employer].  He does not drink alcohol nor does he smoke cigarettes.  Vital Signs:  Patient profile:   77 year old male Height:      69 inches Weight:      172 pounds BMI:     25.49 Pulse rate:   66 / minute Pulse rhythm:   regular BP sitting:   103 / 71  (right arm) Cuff size:   large  Vitals Entered By: Frank Folks  Weeks CMA (January 19, 2010 3:25 PM)  Physical Exam  General:  Well developed, well nourished, in no acute distress. Head:  Normal HEENT Neck:  x-rays were 7 and 8 cm , neck supple Lungs:  clear to auscultation Heart:  regular rate and rhythm with variable S1 and Cannon A. waves Abdomen:  soft nontender active bowel Msk:  Back  normal, normal gait. Muscle strength and tone normal. Extremities:  no clubbing cyanosis or edema Neurologic:  Alert and oriented x 3. Skin:  warm and dry Psych:  depressed affect.     PPM Specifications Following MD:  Sherryl Manges, MD     PPM Vendor:  [employer]     PPM Model Number:  [id number]     PPM Serial Number:  [id number] DOI:  03/23/2009     PPM Implanting MD:  Sherryl Manges, MD  Lead 1    Location: [facility]     DOI: 03/23/2009     Model #: [id number]     Serial #: [id number]     Status: active Lead 2    Location: [facility]     DOI: 03/23/2009     Model #: [id number]     Serial #: [id number]     Status: active  Magnet Response Rate:  BOL 100 ERI  85    PPM Follow Up Battery Voltage:  2.94 V     Pacer Dependent:  No       PPM Device Measurements Atrium  Amplitude: 4.5 mV, Threshold: 1.0 V at .4 msec Right Ventricle  Amplitude: 10.3 mV, Threshold: .5 V at .4 msec  Episodes Coumadin:  No  Parameters Mode:  DDD     Lower Rate Limit:  60     Upper Rate Limit:  120 Paced AV Delay:  200     Sensed AV Delay:  180 Tech Comments:  Device presented in backup VVI. Software reset successful.  Impression & Recommendations:  Problem # 1:  DIASTOLIC HEART FAILURE, ACUTE (ICD-428.31)  The patient has developed acute diastolic heart failure this is presumed based on his previously normal left ventricular function. This may well be related to pacemaker malfunction with inappropriate reversion to VVI mode especially given his underlying complete heart block. His updated medication list for this problem includes:    Aspirin Ec 325 Mg Tbec (Aspirin) .Frank Weeks... Take one tablet by mouth daily    Nitroglycerin 0.4 Mg Subl (Nitroglycerin) ..... One tablet under tongue every 5 minutes as needed for chest pain---may repeat times three    Altace 10 Mg Tabs (Ramipril) .Frank Weeks... Take one tablet once daily  Orders: EKG w/ Interpretation (93000)  Problem # 2:  PACEMAKER MALFUNCTION-REVERSION TO VVI (ICD-996.01) as noted above the  patient's pacemaker has reverted to VVI mode. We are currently under discussions with technical services at [facility] [employer] and understand the mechanism by which this has occurred.  a software reset was accomplishable.  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 There is some unknown likelihood of this kind of reset occurring again  Problem # 3:  CAD, ARTERY BYPASS GRAFT (ICD-414.04) Is unlikely that ischemia is contributing to this problem His updated medication list for this problem includes:    Aspirin Ec 325 Mg Tbec (Aspirin) .Frank Weeks... Take one tablet by mouth daily    Nitroglycerin 0.4 Mg Subl (Nitroglycerin) ..... One tablet under tongue every 5 minutes as needed for chest pain---may repeat times three    Altace  10 Mg Tabs (Ramipril) .Frank Weeks... Take one tablet once daily  Problem # 4:  SECOND DEGREE AV BLOCK, MOBITZ II (ICD-426.12) the patient had evidence of complete heart block today. Pacemaker syndrome was likely the cause of most of his recent symptoms. We'll plan to allow restoration of normal AV synchrony to be our therapeutic input at this time. He will continue on his Lasix for one week and then stop it. He is scheduled to see Dr. Sherrye Payor into half weeks' time. His updated medication list for this problem includes:    Aspirin Ec 325 Mg Tbec (Aspirin) .Frank Weeks... Take one tablet by mouth daily    Nitroglycerin 0.4 Mg Subl (Nitroglycerin) ..... One tablet under tongue every 5 minutes as needed for chest pain---may repeat times three    Altace 10 Mg Tabs (Ramipril) .Frank Weeks... Take one tablet once daily  Patient Instructions: 1)  Your physician recommends that you schedule a follow-up appointment in: 6-8 weeks.

## 2010-11-14 NOTE — Assessment & Plan Note (Signed)
Summary: PER CHECK OUT/SF   Primary Provider:  dr Marveen Reeks   History of Present Illness: Mr. Casto is seen in followup for coronary artery disease with prior bypass and chronic intermittent angina. He was also felt to have symptomatic second-degree heart block and bradycardia and for this he underwent pacemaker implantation earlier this year. This has been associated with only a modest improvement.  He continues to have nitroglycerin responsive chest pain with modest exertion but on a irregular basis.  he is also limited by some shortness of breath. He also has some chest wall pain that are fleeting.   Echocardiogram 2010 demonstrated normal left ventricular function previous three-vessel disease by catheter in 2007 and underwent bypass. He has some complaints at this point of exertional chest discomfort which he describes as angina. It is not reproducible  He has days and he feels better than others. However, was not clear that we can correlate it with what is detected by his pacemaker and atrial fibrillation. He also has frequent nonsustained episodes of atrial tachycardia       Current Medications (verified): 1)  Aspirin Ec 325 Mg Tbec (Aspirin) .... Take One Tablet By Mouth Daily 2)  Crestor 20 Mg Tabs (Rosuvastatin Calcium) .Marland Kitchen.. 1 Tab Qd 3)  Fish Oil  Oil (Fish Oil) .... Take As Directed 4)  Nitroglycerin 0.4 Mg Subl (Nitroglycerin) .... One Tablet Under Tongue Every 5 Minutes As Needed For Chest Pain---May Repeat Times Three 5)  Altace 10 Mg Tabs (Ramipril) .... Take One Tablet Once Daily 6)  Travatan 0.004 % Soln (Travoprost) .Marland Kitchen.. 1 Gtt Ou Once Daily 7)  Xibrom 0.09 % Soln (Bromfenac Sodium) .Marland Kitchen.. 1 Gtt Two Times A Day Ou  Allergies (verified): 1)  ! 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 GLAUCOMA (ICD-365.9) PLEURAL EFFUSION (ICD-511.9)  Past Surgical History: Last updated: 08/01/09  coronary artery   bypass graft surgery, March 18, 2006.   right carotid endarterectomy, December 2005.   left carotid to vertebral bypass   Dual-chamber pacemaker implantation.   thoracentesis and bronchoscopy   Family History: Last updated: 08-01-09  His mother died at 25 from myocardial infarction and had  lung cancer.  His father died at 20 from myocardial infarction.  He has a brother, 59.  He has a twin brother, 39, who has a heart condition.  There is positive family history of tremor.  Social History: Last updated: August 01, 2009  He is retired from working in [employer].  He does not drink alcohol nor does he smoke cigarettes.  Vital Signs:  Patient profile:   77 year old male Height:      69 inches Weight:      173 pounds BMI:     25.64 Pulse rate:   74 / minute Pulse rhythm:   regular BP sitting:   140 / 84  (left arm) Cuff size:   large  Vitals Entered By: Judithe Modest CMA (November 04, 2009 11:26 AM)  Physical Exam  General:  The patient was alert and oriented in no acute distress. HEENT Normal.  Neck veins were flat, carotids were brisk.  Lungs were clear.  Heart sounds were regular without murmurs or gallops.  Abdomen was soft with active bowel sounds. There is no clubbing cyanosis or edema. Skin Warm and dry    EKG  Procedure date:  11/04/2009  Findings:      P. synchronous pacing  PPM Specifications Following MD:  Sherryl Manges, MD     PPM Vendor:  [employer]     PPM  Model Number:  [id number]     PPM Serial Number:  [id number] DOI:  03/23/2009     PPM Implanting MD:  Sherryl Manges, MD  Lead 1    Location: [facility]     DOI: 03/23/2009     Model #: [id number]     Serial #: [id number]     Status: active Lead 2    Location: [facility]     DOI: 03/23/2009     Model #: [id number]     Serial #: [id number]     Status: active  Magnet Response Rate:  BOL 100 ERI  85    PPM Follow Up Pacer Dependent:  No      Episodes Coumadin:  No  Parameters Mode:  DDD     Lower Rate Limit:  60     Upper Rate Limit:  120 Paced AV Delay:  200     Sensed AV Delay:  180  Impression & Recommendations:  Problem # 1:  PACEMAKER, PERMANENT (ICD-V45.01) Device parameters and data were reviewed and no changes were made  Problem # 2:  ATRIAL FIBRILLATION (ICD-427.31) atrial fibrillation was detected by his device constituting 3.5% of the time. The longest episode however was only 6 hours. It is described atrial rate of 600 beats per minute. Most of the episodes are just minutes in length.  The issues on the table include the need for oral anticoagulation with his prior history of stroke. All the to clarify the mechanism of this because it is described in the old record as extracranial disease. I have raised the issue with the family that Coumadin may be necessary His updated medication list for this problem includes:    Aspirin Ec 325 Mg Tbec (Aspirin) .Marland Kitchen... Take one tablet by mouth daily  Problem # 3:  CAD, ARTERY BYPASS GRAFT (ICD-414.04) the patient is having exertional chest discomfort that is nonreproducible. I will begin him on a beta blocker Lopressor 12.5 b.i.d. Will also decrease his Crestor from 7-3 days a week. To see if he tolerates this any better. He continues to have chest discomfort we'll undertake a Myoview scan His updated medication list for this problem includes:    Aspirin Ec 325 Mg Tbec (Aspirin) .Marland Kitchen... Take one tablet by mouth daily    Nitroglycerin 0.4 Mg Subl (Nitroglycerin) .....  One tablet under tongue every 5 minutes as needed for chest pain---may repeat times three    Altace 10 Mg Tabs (Ramipril) .Marland Kitchen... Take one tablet once daily  Problem # 4:  DYSLIPIDEMIA (ICD-272.4) as above His updated medication list for this problem includes:    Crestor 20 Mg Tabs (Rosuvastatin calcium) .Marland Kitchen... 1 tab qd  Other Orders: EKG w/ Interpretation (93000)

## 2010-11-16 NOTE — Procedures (Signed)
Summary: Summary Report  Summary Report   Imported By: Erle Crocker 11/01/2010 16:24:03  _____________________________________________________________________  External Attachment:    Type:   Image     Comment:   External Document

## 2010-11-16 NOTE — Assessment & Plan Note (Signed)
Summary: ok per Rhonda/safpc2   Visit Type:  Follow-up Primary Provider:  dr Marveen Reeks  CC:  pt wore a monitor.  History of Present Illness: Mr. Frank Weeks is seen in followup for coronary artery disease prior bypass surgery and intermittent angina. Most recently he had acute diastolic heart failure related to inappropriate reversion of his pacemaker from the DDD mode to the VVI mode; this had to be adjusted with computer rebooting  He continues to have exertional chest discomfort.this is largely with exertion but not reproducibly so. Has also occasionally accompanied by lightheadedness. He can be aggravated by activity of his upper arms.   a Myoview in 2009demonstrated EF:  56 %  He uses nitroglycerin with some Echocardiogram 2010 demonstrated normal left ventricular function  At his last visit he was having symptoms of palpitations. We undertook an event monitor to try to identify a rhythmic correlate to see whether it for fibrillation might be continued. There was one episode of lightheadedness. He was associated with ventricular ectopy. He however on other parts the monitor had similar burdens of ventricular ectopy without symptoms.     Current Medications (verified): 1)  Aspirin Ec 325 Mg Tbec (Aspirin) .... Take One Tablet By Mouth Daily 2)  Crestor 20 Mg Tabs (Rosuvastatin Calcium) .... Take One Tablet Three Times A Week 3)  Fish Oil  Oil (Fish Oil) .... Take As Directed 4)  Nitroglycerin 0.4 Mg Subl (Nitroglycerin) .... One Tablet Under Tongue Every 5 Minutes As Needed For Chest Pain---May Repeat Times Three 5)  Altace 10 Mg Tabs (Ramipril) .... Take One Tablet Once Daily 6)  Travatan 0.004 % Soln (Travoprost) .Marland Kitchen.. 1 Gtt Ou Once Daily 7)  Xibrom 0.09 % Soln (Bromfenac Sodium) .Marland Kitchen.. 1 Gtt Two Times A Day Ou 8)  Vitamin B-12 100 Mcg Tabs (Cyanocobalamin) .... Once Daily  Allergies (verified): 1)  ! 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 GLAUCOMA (ICD-365.9) PLEURAL EFFUSION (ICD-511.9)  Vital Signs:  Patient profile:   77 year old male Height:      69 inches Weight:      175 pounds BMI:     25.94 Pulse rate:   79 / minute BP sitting:   140 / 83  (left arm)  Vitals Entered By: Burnett Kanaris, CNA (October 19, 2010 11:50 AM)  Physical Exam  General:  The patient was alert and oriented in no acute distress. HEENT Normal.  Neck veins were flat, carotids were brisk.  Lungs were clear.  Heart sounds were regular without murmurs or gallops.  Abdomen was soft with active bowel sounds. There is no clubbing cyanosis or edema. Skin Warm and dry    Event Monitor  Procedure date:  10/19/2010  Findings:      lbbb awith some pacs    PPM Specifications Following MD:  Sherryl Manges, MD     [facility] Vendor:  [facility]     PPM Model Number:  [id number]     [facility] Serial Number:  [id number] PPM DOI:  03/23/2009     PPM Implanting MD:  Sherryl Manges, MD  Lead 1    Location: RA  DOI: 03/23/2009     Model #: [id number]     Serial #: [id number]     Status: active Lead 2    Location: RV     DOI: 03/23/2009     Model #: [id number]     Serial #: [id number]     Status: active  Magnet Response Rate:  BOL 100 ERI  85    PPM Follow Up Remote Check?  No Battery Voltage:  2.95 V     Battery Est. Longevity:  7.8 years     Pacer Dependent:  Yes       PPM Device Measurements Atrium  Amplitude: 4.7 mV, Impedance: 430 ohms,   Right Ventricle  Amplitude: 11.0 mV, Impedance: 460 ohms, Threshold: 0.625 V at 0.4 msec  Episodes MS Episodes:  787     Percent Mode Switch:  11%     Coumadin:  No Atrial Pacing:  25%     Ventricular Pacing:  100%  Parameters Mode:  DDD     Lower Rate Limit:  60     Upper Rate Limit:  120 Paced AV Delay:  200     Sensed AV Delay:  180 Next Cardiology Appt Due:  04/15/2011 Tech Comments:  Ventricular autocapture on today.  PMT episode noted is A-fib.  ROV 6 months with Dr. Graciela Husbands. Altha Harm, LPN  October 19, 2010 1:58 PM   Impression & Recommendations:  Problem # 1:  PALPITATIONS (ICD-785.1) the patient's palpitations seemed to be inconsistently correlating with PVCs/PACs. So there is nothing life-threatening. We'll continue current medications. No atrial fibrillation was seen His updated medication list for this problem includes:    Aspirin Ec 325 Mg Tbec (Aspirin) .Marland Kitchen... Take one tablet by mouth daily    Nitroglycerin 0.4 Mg Subl (Nitroglycerin) ..... One tablet under tongue every 5 minutes as needed for chest pain---may repeat times three    Altace 10 Mg Tabs (Ramipril) .Marland Kitchen... Take one tablet once daily  Problem # 2:  ATRIAL FIBRILLATION (ICD-427.31) as above His updated medication list for this problem includes:    Aspirin Ec 325 Mg Tbec (Aspirin) .Marland Kitchen... Take one tablet by mouth daily  Problem # 3:  CAD, ARTERY BYPASS GRAFT (ICD-414.04) he continues to have episodes of intermittent chest pain. He will continue using nitroglycerin as needed His updated medication list for this problem includes:    Aspirin Ec 325 Mg Tbec (Aspirin) .Marland Kitchen... Take one tablet by mouth daily    Nitroglycerin 0.4 Mg Subl (Nitroglycerin) ..... One tablet under tongue every 5 minutes as needed for chest pain---may repeat times three    Altace 10 Mg Tabs (Ramipril) .Marland Kitchen... Take one tablet once daily  Patient Instructions: 1)  Your physician recommends that you continue on your current medications as directed.  Please refer to the Current Medication list given to you today. 2)  Your physician wants you to follow-up in: 6 months  You will receive a reminder letter in the mail two months in advance. If you don't receive a letter, please call our office to schedule the follow-up appointment.

## 2010-11-17 NOTE — Letter (Signed)
Summary: [facility]  [facility]   Imported By: Roderic Ovens 01/20/2010 15:49:32  _____________________________________________________________________  External Attachment:    Type:   Image     Comment:   External Document

## 2011-01-22 LAB — BASIC METABOLIC PANEL
Calcium: 9.6 mg/dL (ref 8.4–10.5)
GFR calc non Af Amer: >60 mL/min (ref >60)
Potassium: 4.5 mEq/L (ref 3.5–5.1)
Sodium: 139 mEq/L (ref 135–145)

## 2011-01-22 LAB — PROTIME-INR: INR: 1 (ref 0.00–1.49)

## 2011-01-22 LAB — CBC
HCT: 41.8 % (ref 39.0–52.0)
Hemoglobin: 14.1 g/dL (ref 13.0–17.0)
Platelets: 176 10*3/uL (ref 150–400)
RDW: 14.9 % (ref 11.5–15.5)
WBC: 7.8 10*3/uL (ref 4.0–10.5)

## 2011-01-22 LAB — APTT: aPTT: 27 seconds (ref 24–37)

## 2011-01-30 LAB — COMPREHENSIVE METABOLIC PANEL
ALT: 13 U/L (ref 0–53)
AST: 19 U/L (ref 0–37)
Alkaline Phosphatase: 94 U/L (ref 39–117)
CO2: 28 mEq/L (ref 19–32)
Chloride: 103 mEq/L (ref 96–112)
GFR calc Af Amer: >60 mL/min (ref >60)
GFR calc non Af Amer: >60 mL/min (ref >60)
Glucose, Bld: 105 mg/dL — ABNORMAL HIGH (ref 70–99)
Potassium: 4.3 mEq/L (ref 3.5–5.1)
Sodium: 139 mEq/L (ref 135–145)

## 2011-01-30 LAB — CULTURE, RESPIRATORY W GRAM STAIN

## 2011-01-30 LAB — PATHOLOGIST SMEAR REVIEW

## 2011-01-30 LAB — CBC
Hemoglobin: 14.4 g/dL (ref 13.0–17.0)
RBC: 4.48 MIL/uL (ref 4.22–5.81)
WBC: 8.4 10*3/uL (ref 4.0–10.5)

## 2011-01-30 LAB — BODY FLUID CULTURE
Culture: NO GROWTH
Gram Stain: NONE SEEN

## 2011-01-30 LAB — FUNGUS CULTURE W SMEAR: Fungal Smear: NONE SEEN

## 2011-01-30 LAB — AMYLASE, BODY FLUID: Amylase, Fluid: 41 U/L

## 2011-01-30 LAB — AFB CULTURE WITH SMEAR (NOT AT ARMC): Acid Fast Smear: NONE SEEN

## 2011-01-30 LAB — ALBUMIN, FLUID (OTHER): Albumin, Fluid: 2 g/dL

## 2011-01-30 LAB — PROTEIN, BODY FLUID

## 2011-01-30 LAB — PROTIME-INR: Prothrombin Time: 14.5 seconds (ref 11.6–15.2)

## 2011-01-30 LAB — BODY FLUID CELL COUNT WITH DIFFERENTIAL: Monocyte-Macrophage-Serous Fluid: 1 % — ABNORMAL LOW (ref 50–90)

## 2011-01-30 LAB — GLUCOSE, CAPILLARY: Glucose-Capillary: 98 mg/dL (ref 70–99)

## 2011-01-30 LAB — LACTATE DEHYDROGENASE, PLEURAL OR PERITONEAL FLUID

## 2011-02-19 ENCOUNTER — Ambulatory Visit (INDEPENDENT_AMBULATORY_CARE_PROVIDER_SITE_OTHER): Payer: Medicare Other | Admitting: *Deleted

## 2011-02-19 DIAGNOSIS — I4891 Unspecified atrial fibrillation: Secondary | ICD-10-CM

## 2011-02-19 DIAGNOSIS — Z950 Presence of cardiac pacemaker: Secondary | ICD-10-CM

## 2011-02-19 DIAGNOSIS — I441 Atrioventricular block, second degree: Secondary | ICD-10-CM

## 2011-02-21 ENCOUNTER — Encounter: Payer: Self-pay | Admitting: *Deleted

## 2011-02-27 ENCOUNTER — Encounter: Payer: Self-pay | Admitting: Internal Medicine

## 2011-02-27 ENCOUNTER — Ambulatory Visit (INDEPENDENT_AMBULATORY_CARE_PROVIDER_SITE_OTHER): Payer: Medicare Other | Admitting: Internal Medicine

## 2011-02-27 DIAGNOSIS — I441 Atrioventricular block, second degree: Secondary | ICD-10-CM

## 2011-02-27 DIAGNOSIS — R0602 Shortness of breath: Secondary | ICD-10-CM

## 2011-02-27 DIAGNOSIS — R0789 Other chest pain: Secondary | ICD-10-CM

## 2011-02-27 DIAGNOSIS — I4891 Unspecified atrial fibrillation: Secondary | ICD-10-CM

## 2011-02-27 DIAGNOSIS — I2510 Atherosclerotic heart disease of native coronary artery without angina pectoris: Secondary | ICD-10-CM

## 2011-02-27 DIAGNOSIS — Z950 Presence of cardiac pacemaker: Secondary | ICD-10-CM

## 2011-02-27 NOTE — Assessment & Plan Note (Signed)
Stable but intermiettent   WIth abnormal lung exam aI have recommmended that he followup with Dr Darcus Austin

## 2011-02-27 NOTE — Assessment & Plan Note (Signed)
[facility]                            CARDIOLOGY OFFICE NOTE   Frank Weeks, Frank Weeks                         MRN:          [id number]  DATE:03/05/2008                            DOB:          1929-08-19    PROBLEM LIST:  1. Coronary artery disease.  He has a history of remote infarction 14      years ago status post coronary bypass surgery in 2007.  He has had      some exertional angina since then, but this is infrequent.  He      rarely has taken nitroglycerin.  He does have a LIMA to the LAD.      He has refused re-cath.  2. Good left ventricular function, EF 50%.  3. History of carotid artery disease.  He is status post right carotid      endarterectomy December 2005.  He had a left carotid vertebral      bypass 19 years ago.  He has had a previous subarachnoid      hemorrhage.  He has had a right hemispheric punctate infarct.  He      is stable and having no symptoms of TIAs.  4. Hyperlipidemia.  These were checked recently and look relatively      good except for high triglycerides.  His LDL 75.  His HDL is 24,      unfortunately.  5. Hypertension.   He is followed by Dr. Rodrigo Ran.   He has no complaints except some erectile dysfunction.  He would like  permission to use Viagra.   CURRENT MEDS:  1. Crestor 10 mg a day.  2. Aspirin 325 mg day.  3. Fish oil b.i.d.  4. Altace 10 mg a day.  5. Nitroglycerin p.r.n.   His blood pressure today is 124/72, his pulse is 50.  EKG shows sinus  brady with ST-segment changes laterally, which are a little more  prominent today.  He has a first-degree AV block which is old.  HEENT:  Unchanged.  Carotids are full.  He has a soft right carotid bruit.  Thyroid is not  enlarged.  Trachea is midline.  LUNGS:  Clear.  HEART:  Reveals a nondisplaced PMI.  He has normal S1-S2.  ABDOMEN:  Soft, good bowel sounds.  No midline bruit or hepatomegaly.  EXTREMITIES:  No cyanosis, clubbing or edema.  Pulses are  intact.  NEURO:  Exam is intact.   [name] is doing well from our standpoint.  I have approved Viagra 50 mg  p.r.n.  He has been advised not to take nitroglycerin for 24 hours after  taking it.  I will see him back in 6 months.     Thomas C. Daleen Squibb, MD, [facility]  Electronically Signed    TCW/MedQ  DD: 03/05/2008  DT: 03/05/2008  Job #: [id number]

## 2011-02-27 NOTE — Progress Notes (Signed)
  HPI  Frank Weeks is a 77 y.o. male Seen in followup for pacemaker implantation for second degree heart block and bradycardia.  He also has coronary artery disease prior bypass surgery and intermittent angina. Most recently he had acute diastolic heart failure related to inappropriate reversion of his pacemaker from the DDD mode to the VVI mode; this had to be adjusted with computer rebooting  He no longer has complaints of  exertional chest discomfort.     a Myoview in 2010 demonstrated EF:  56 %    Echocardiogram 2010 demonstrated normal left ventricular function;  He does have complaints of intermittent extreme fatigue as well as lightheadedness with bending over.  He has a history of lung cancer. I don't have the details of this. There is apparently some peribronchiolar involvement.   No past medical history on file.  No past surgical history on file.  Current Outpatient Prescriptions  Medication Sig Dispense Refill  . aspirin 325 MG tablet Take 325 mg by mouth daily.        . fish oil-omega-3 fatty acids 1000 MG capsule Take 2 g by mouth daily.        . nitroGLYCERIN (NITROSTAT) 0.4 MG SL tablet Place 0.4 mg under the tongue every 5 (five) minutes as needed.        . ramipril (ALTACE) 10 MG capsule Take 10 mg by mouth daily.        . rosuvastatin (CRESTOR) 10 MG tablet Take 10 mg by mouth. Take 1 tablet by mouth three times a week.       . travoprost, benzalkonium, (TRAVATAN) 0.004 % ophthalmic solution Place 1 drop into both eyes at bedtime.        . vitamin B-12 (CYANOCOBALAMIN) 100 MCG tablet Take 100 mcg by mouth daily.          Allergies  Allergen Reactions  . Atorvastatin     Review of Systems negative except from HPI and PMH  Physical Exam Well developed and well nourished in no acute distress HENT normal E scleral and icterus clear Neck Supple JVP flat; carotids brisk and full Clear to ausculation with decreased breath sounds on the left Regular rate and  rhythm, no murmurs gallops or rub Soft with active bowel sounds No clubbing cyanosis and edema Alert and oriented, grossly normal motor and sensory function Skin Warm and Dry     Assessment and  Plan

## 2011-02-27 NOTE — Letter (Signed)
December 22, 2008   Mark A. Perini, MD  [address]  [place], [place] [address]   Re:  Frank Weeks, AUBUCHON                  DOB:  1929-02-24   Dear Loraine Leriche,   I saw the patient back today and again discussed the options with him.  He is somewhat hesitant to have any surgery, but I explained to him that  his chest x-ray still showed the left lower lobe with a marked effusion  and scarring of the left upper lobe.  Because of this, I felt that he  still need to have surgery.  He is being evaluated by Dr. Graciela Husbands for a  pacemaker.  The patient is somewhat reluctant to have anything done at  this present time, but I think his only way that we can be for sure this  is not a cancer and to preserve the left lower lobe is to do a VATS  decortication of the left lower lobe and the pleural effusion.  I will  see him back again in 4 weeks.  His blood pressure is 142/76, pulse 76,  respirations 18, sats are 96%.   Ines Bloomer, M.D.  Electronically Signed   DPB/MEDQ  D:  12/22/2008  T:  12/22/2008  Job:  [id number]   cc:   Thomas C. Wall, MD, [facility]

## 2011-02-27 NOTE — Assessment & Plan Note (Signed)
[facility]                            CARDIOLOGY OFFICE NOTE   [place], Frank Weeks                         MRN:          [id number]  DATE:12/22/2008                            DOB:          21-Aug-1929    Mr. Frank Weeks comes in today for second-degree AV block.   Frank Weeks recently saw Frank Weeks on December 08, 2008.  Please refer  to his note.   PROBLEM LIST:  1. Second-degree atrioventricular block, intermittent, with a baseline      first-degree atrioventricular block.  Frank Weeks has a modestly broad QRS,      this seems to be worsening.  Frank Weeks is not clearly symptomatic at this      point.  2. Ischemic heart disease with a history of prior bypass surgery.  Frank Weeks      has normal left ventricular function.  3. Pleural effusion, status post thoracentesis, status post recent      bronchoscopy with no obvious tumor.  Frank Weeks saw Dr. Edwyna Weeks today, who      recommended a video-assisted thoracic surgery with what sounds like      pleura desist.  4. Peripheral vascular disease.  Frank Weeks has had a previous carotid      vertebral bypass.  5. Gastroesophageal reflux, which is associated with probable cough.      Frank Weeks stopped his Altace for 2 weeks, but there has been no      improvement in his cough.   Frank Weeks does have nonspecific fatigue, but Frank Weeks has had this for a long time.  Frank Weeks has been a little less attentive and some listlessness has been  noted.  Frank Weeks has modestly impaired the exercise tolerance.   Frank Weeks recommended an event recorder, which was said to him  yesterday.  Frank Weeks has not put it on.   Looking back to his chart, I have stopped his beta-blocker in 2007  because of a prolonged PR interval.  This actually improved off of  Toprol.  His symptoms of fatigue also improved.  Frank Weeks had 1 EKG in May  2009, which showed his second-degree AV block, but it was not clear  whether it was Wenckebach or Mobitz II.  Looking back at it, it looks as  though it were Wenckebach.   Frank Weeks  is not the least bit anxious to have anything done including the  surgery by Dr. Edwyna Weeks.  I have tried talking to him today and Frank Weeks his  obstinate that things have been over done and his doctors are turning  into a bunch of terror squad.   MEDICATIONS:  Today include;  1. Aspirin 325 mg a day.  2. Fish oil b.i.d.  3. Crestor 20 mg a day.  Frank Weeks has never liked to take medications.   His blood pressure today is 130/76. His heart rate is 61.  His weight is  169.  Frank Weeks is alert and oriented x3, though Frank Weeks is somewhat inappropriate.  The rest of his exam is unchanged.   I had a long talk with Frank Weeks and his wife.  Frank Weeks clearly does not want  to pacer at this point and Frank Weeks is really is strongly thinking about not  having the surgery by Dr. Edwyna Weeks.  Frank Weeks has agreed and Frank Weeks thinks it makes  sense to wear the event recorder.  If this shows really severe  bradycardia and high-degree AV block, we would proceed with pacer or  certainly try to talk him into it.  I will see him back in about 3  months.     Thomas C. Daleen Squibb, MD, [facility]  Electronically Signed    TCW/MedQ  DD: 12/22/2008  DT: 12/23/2008  Job #: [id number]   cc:   Ines Bloomer, M.D.

## 2011-02-27 NOTE — Assessment & Plan Note (Signed)
Stable on pacer  

## 2011-02-27 NOTE — Assessment & Plan Note (Signed)
[facility]                            CARDIOLOGY OFFICE NOTE   Rj, Pedrosa KEIDAN AUMILLER                         MRN:          [id number]  DATE:08/14/2007                            DOB:          14-Nov-1928    Frank Weeks comes in for followup of his coronary disease.  1. He has a history of remote myocardial infarction 13 years ago and      coronary artery bypass surgery x3 in 2007.  Ever since then, he has      had some exertional angina, particularly with carrying heavy      things.  He has never really been happy with his outcome.  2. Good left ventricular function, EF 50%.  3. History of coronary artery disease.  He is status post a right      carotid endarterectomy in December, 2005.  He has had a history of      a right hemispheric punctate infarct.  He has also had a      subarachnoid hemorrhage at age 77.  He also gives a history of a      left carotid vertebral bypass 18 years ago.  4. Hyperlipidemia.  5. Hypertension.  Followed by Dr. Waynard Edwards.   Other than his usual exertional angina, he is doing well.  He has  multiple questions, which I have answered today.   MEDICATIONS:  1. Altace 10 mg a day.  2. Fish oil b.i.d.  3. Aspirin 325 mg a day.  4. Crestor 10 mg a day.   Labs were checked by Dr. Waynard Edwards in April, and he is at goal.   PHYSICAL EXAMINATION:  His blood pressure is 110/80.  His pulse is 70  and regular.  His weight is 179.  HEENT:  Normocephalic and atraumatic.  PERRLA.  Extraocular movements  are intact.  Sclerae are clear.  Facial symmetry is normal.  NECK:  Carotids show good upstrokes bilaterally.  He has a right carotid  bruit.  There is an endarterectomy scar present.  He has a left common  carotid to vertebral scar on the left.  There is no obvious bruit.  HEART:  Regular rate and rhythm, nondisplaced PMI.  S2 splits  physiologically.  LUNGS:  Clear.  ABDOMEN:  Soft with good bowel sounds.  There is no midline bruit.  There  is no flank bruits.  EXTREMITIES:  No clubbing, cyanosis or edema.  Pulses are present.  NEUROLOGIC:  Intact.  SKIN:  Remarkable for a few ecchymoses.   His EKG shows normal sinus rhythm with a first-degree AV block, which is  unchanged.  He has LVH with strain.   I had a nice chat with Mr. Bonito.  I have answered all of his questions. I  have recommended carotid Dopplers, which have not been done as far as I  know, since his endarterectomy.  We will plan on seeing him back again  in six months.     Thomas C. Daleen Squibb, MD, [facility]  Electronically Signed    TCW/MedQ  DD: 08/14/2007  DT: 08/14/2007  Job #:  [id number] 

## 2011-02-27 NOTE — Letter (Signed)
December 01, 2008   Mark A. Perini, MD  [address]  [place], [place] [address]   Re:  Frank Weeks, EHRMAN                  DOB:  02-12-1929   Dear Loraine Leriche,   I saw the patient in the office today.  This 77 year old patient had a  previous coronary artery bypass done 3 years ago done in 2007 by Dr.  Tyrone Sage.  His postoperative x-rays just showed a small left effusion.  He now is referred today with a loculated left effusion and what appears  to be a collapse of the left lower lobe and some questionable lesion or  an area of infiltration of the left upper lobe.  Thoracentesis was  apparently negative.  This showed serous fluid.  A PET scan was done  that showed minimal uptake in the left lower lobe and left upper lobe  lingular lesion.  She has had no hemoptysis, fever, chills, or excessive  sputum.  His pulmonary function tests showed an FVC of 2.0 with an FEV-1  of 0.31, which is quite unusual which would go along with evidence of  obstruction.   PAST MEDICAL HISTORY:  Significant for he had a carotid artery disease  and carotid vertebral done by Dr. Hart Rochester.  He also has evidence of  atherosclerosis in his abdomen in his previous CT scan.   MEDICATIONS:  Nitroglycerin p.r.n., Altace 10 mg a day, Crestor 10 mg a  day, Metamucil p.r.n., Pepcid, aspirin, fish oil, Lipitor causes muscle  aches.   FAMILY HISTORY:  Positive for cardiac disease.   SOCIAL HISTORY:  He is married, has 2 children.  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  Quit smoking 50 years ago.   REVIEW OF SYSTEMS:  Vital Signs:  He is 150 pounds.  He is 5 feet 8  inches.  He had some weight loss by diet.  Cardiac:  See history of  present illness.  He has had no atrial arrhythmias, but he gets  shortness of breath with exertion.  Apparently, has a heart murmur.  Pulmonary:  No hemoptysis.  GI:  Some GERD.  GU:  No dysuria or frequent  urination.  Vascular:  He has had a previous TIAs and see past medical  history.   No DVT or claudication.  Neurological:  No dizziness,  headaches, blackouts, or seizures.  Musculoskeletal:  He has got some  arthritis.  Psychiatric:  No psychiatric illness.  Eye/ENT:  No changes  in eyesight or hearing.  Hematological:  No problems with bleeding or  clotting disorders.   PHYSICAL EXAMINATION:  GENERAL:  He is a well-developed Caucasian male,  in no acute distress.  VITAL SIGNS:  His blood pressure was 143/81, pulse 45, respirations 18,  and sats were 98%.  HEAD, EYES, EARS, NOSE AND THROAT:  Unremarkable except that he has had  recent cataract surgery.  NECK:  Supple.  He has got carotid endarterectomy scars.  There are no  masses palpable.  No thyromegaly. No jugular venous distention.  CHEST:  Clear to auscultation and percussion on the right and decreased  breath sounds on left base, increased dullness on the left side.  HEART:  Regular sinus rhythm, 1/6 systolic ejection murmur.  ABDOMEN:  Soft.  There is no hepatosplenomegaly.  EXTREMITIES:  Pulses are 2+.  There is no clubbing or edema.  NEUROLOGICAL:  He is oriented x3.  Sensory and motor intact.  Cranial  nerves were intact.  He has venectomy scars.   I feel that he probably does not have the cancer, so today I have  recommended he get a full set of pulmonary function test since one the  screening test here seemed to be are abnormal, but seemed out of  proportion to his symptomatology.  Also, probably he needs the  bronchoscopy to rule out any intrabronchial lesions.  My first thought  is he has trapped the left lower lobe secondary to a fibrothorax with a  loculated effusion and this is what she can see sometimes after surgery,  although as I mentioned he did not have any minimal effusion immediately  after his surgery.  I discussed this in great detail with the patient  and his wife and his family and we will do a bronchoscopy on him on  Monday at [facility] on December 06, 2008.   Ines Bloomer,  M.D.  Electronically Signed   DPB/MEDQ  D:  12/01/2008  T:  12/01/2008  Job:  [id number]

## 2011-02-27 NOTE — Assessment & Plan Note (Signed)
[facility]                            CARDIOLOGY OFFICE NOTE   Beverly, Suriano FIDENCIO DUDDY                         MRN:          [id number]  DATE:09/02/2008                            DOB:          November 03, 1928    Mr. Murakami comes in today for a followup.   His biggest complaint is that of feeling woozy when he turns his head a  certain way, when he stands up too quick on occasion, and he has also  had problems with his balance and gait.  When asked how long this has  been going on, this has been going on for over a year.  He has talked to  Dr. Rodrigo Ran about this as well.   He has had a history of some TIAs and strokes in the past, and it  perhaps is associated with that.  He has also had a previous  subarachnoid hemorrhage.  I told he probably really needs a CT scan of  his head to differentiate any other problems such as a cerebellar issue.  It could be also some inner ear issues.   He has also had a cough of 6 weeks, but this past week, it has resolved.  He denies any acid reflux.  He has some occasional chest discomfort, but  it easily goes away with an increased breath.  He is really not having  any exertional angina.  He denies any hemoptysis or any productive  sputum.  He has had no fever, chills, or sweats.   He as always complains about taking medications.  He read the Zetia  controversy on the small study just came out from the [facility].  He is not  taking Zetia, but he is taking Crestor 20 mg a day.  He was intolerant  of Lipitor.  He is also on Altace 10 mg a day, fish oil b.i.d., and  aspirin 325 a day.   For his problem list, please see my note from Mar 05, 2008.   PHYSICAL EXAMINATION:  VITAL SIGNS:  His blood pressure today is 134/80;  his pulse is 68 and regular; his weight is 177, down 4; he looks younger  than stated age; respiratory rate is 69.  HEENT:  Normal.  NECK:  Supple.  Carotid upstrokes are equal bilaterally with soft  systolic  bruit on the right with a carotid endarterectomy scar.  LUNGS:  Clear to auscultation and percussion.  HEART:  Regular rate and rhythm.  No gallop.  ABDOMINAL:  Soft, good bowel sounds.  No midline bruit.  No pulsatile  mass.  EXTREMITIES:  No cyanosis, clubbing, or edema.  Pulses are intact.  NEURO:  Problem with heel-to-toe but not preferentially following one  side or the other.   ASSESSMENT AND PLAN:  Mr. Laur is stable from our standpoint.  He could  have an ACE-induced cough; however, it has resolved and only lasted for  6 weeks.  I have advised him that if he returns or he continues, he  needs a followup chest x-ray as well as perhaps switching from an ACE  inhibitor and ARB.  He can follow up with Dr. Waynard Edwards concerning this.  In addition, if he continues to have problems with his balance and gait,  which seems to be chronic, he might need a CT of his head to look at his  cerebellum.   We will obtain carotid Dopplers today with his history of carotid  endarterectomy and previous bypass on the left as well.   Otherwise, I will see him back in 6 months.     Thomas C. Daleen Squibb, MD, [facility]  Electronically Signed    TCW/MedQ  DD: 09/02/2008  DT: 09/02/2008  Job #: [id number]   cc:   Loraine Leriche A. Perini, M.D.

## 2011-02-27 NOTE — Letter (Signed)
February 01, 2010   Mark A. Waynard Edwards, M.D.  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 His chest x-rays of the  last three times in January, March, and April all revealed the chronic  left effusion that is essentially unchanged.  He has had apparently  pacemaker dysfunction recently and that has been reprogrammed.  He also  been treated for pneumonia and this improved since he got antibiotics  but is still somewhat weak.  His other complaint is his eyesight which  he says has not been right since he had his cataract surgery.  We had a  long discussion again about the best options for him and I still  recommend that he get a decortication.  I explained to him that the  pneumonia could have occurred because of the atelectasis of the left  lower lobe as well as the left lingula, but the patient and his wife  still feels he is asymptomatic.  I would disagree with that since this  recent pneumonia could probably partly be related to this.  His blood  pressure is 130/79, pulse 79, respirations 18, saturations  were 98%.  Again, I recommend that he proceed with a decortication but he does not  request to have any type of surgery at all, so I will refer him back to  you for long-term followup.  I recommend a chest x-ray probably every 6  months.   Ines Bloomer, M.D.  Electronically Signed   DPB/MEDQ  D:  02/01/2010  T:  02/02/2010  Job:  [id number]

## 2011-02-27 NOTE — Op Note (Signed)
NAME:  Frank Weeks, Frank Weeks                  ACCOUNT NO.:  [id number]   MEDICAL RECORD NO.:  [id number]          PATIENT TYPE:  AMB   LOCATION:  SDS                          FACILITY:  [facility]   PHYSICIAN:  Ines Bloomer, M.D. DATE OF BIRTH:  [birth year]   DATE OF PROCEDURE:  12/06/2008  DATE OF DISCHARGE:                               OPERATIVE REPORT   PREOPERATIVE DIAGNOSIS:  Left lower lobe atelectasis.   POSTOPERATIVE DIAGNOSIS:  Left lower lobe atelectasis.   OPERATION PERFORMED:  Video bronchoscopy.   This patient had developed a left lung effusion with atelectasis at the  left lower lobe.  He was brought to the operating room for bronchoscopy  and after local anesthesia with Cetacaine, Xylocaine, and IV sedation,  the video bronchoscope was passed through the mouth.  The cords were in  the midline.  The trachea was normal.  The carina was in the midline.  The right upper lobe, right middle lobe, and right lower lobe orifices  were normal.  The left mainstem, left upper lobe, and all segments of  the left lower lobe were completely normal.  No endoluminal lesion was  seen.  Cytologies and cultures were taken from the left lower lobe.  The  video bronchoscope was removed.  The patient tolerated the procedure  well and was returned to recovery room in stable condition.      Ines Bloomer, M.D.  Electronically Signed     DPB/MEDQ  D:  12/06/2008  T:  12/06/2008  Job:  [id number]

## 2011-02-27 NOTE — Discharge Summary (Signed)
NAME:  Frank Weeks, Frank Weeks NO.:  [id number]   MEDICAL RECORD NO.:  [id number]          PATIENT TYPE:  INP   LOCATION:  3739                         FACILITY:  [facility]   PHYSICIAN:  Frank Salvia, MD, FACCDATE OF BIRTH:  1929/01/29   DATE OF ADMISSION:  03/23/2009  DATE OF DISCHARGE:  03/24/2009                               DISCHARGE SUMMARY   PRIMARY CARE PHYSICIAN:  Frank A. Perini, MD   ALLERGIES:  This patient has an allergy to LIPITOR.   TIME FOR THIS DICTATION:  Greater than 45 minutes including explanation  rather lengthy for the patient and his wife.   FINAL DIAGNOSES:  1. Discharging day #1 status post implant of a [employer]. Jude ACCENT DR RF      dual-chamber pacemaker by Dr. Sherryl Weeks.  2. High-grade heart block.   SECONDARY DIAGNOSES:  1. History of coronary artery disease status post coronary artery      bypass graft surgery, March 18, 2006.  2. Left ventricular function is normal per echocardiogram, Mar 05, 2009.  3. Stable left pleural effusion with complete collapse of the left      lower lobe by chest x-ray comparing April 14 to March 24, 2009.  4. Gastroesophageal reflux disease.  5. The patient is awaiting possible video-assisted thoracoscopic      surgery/drainage of chronic left pleural effusion with      decortication of the left lower lobe.  6. History of right-sided stroke in 1988.  7. Extracranial cerebrovascular occlusive disease.      a.     Status post right carotid endarterectomy, December 2005.      b.     Status post left carotid to vertebral bypass 17 years ago,       both by Frank Weeks.  8. Dyslipidemia.  9. Hypertension.  10.History of subarachnoid hematoma at age 44.  11.Glaucoma.   PROCEDURE:  March 23, 2009, implant of the St. Jude dual-chamber ACCENT  pacemaker for high-grade heart block by Dr. Graciela Weeks.  The patient had no  postprocedural hematoma.  The chest x-ray shows the stable left pleural  effusion but no apical  pneumothorax.  The leads are in appropriate  position.  The device has been interrogated and all values within normal  limits.   BRIEF HISTORY:  [name] is a 77 year old male.  He is in referral by Dr.  Sherryl Weeks for consideration of pacemaker implantation.  He had a  bronchoscopy, February 2010.  He was noted to have significant high-  grade heart block.  He also has significant first-degree AV block.  His  major attributable symptom is fatigue.  The patient also describes  anorexia unresponsive but not to spare.  He is currently in conference  with Dr. Edwyna Weeks regarding drainage of this left pleural effusion with  necessary decortication procedure after the drainage but is apparently  somewhat hesitant to have this performed since he says he is not having  shortness of breath.  The patient will present electively for pacemaker  implantation on March 23, 2009.   HOSPITAL COURSE:  As described above, the patient presented on March 23, 2009 and underwent implantation of the dual-chamber pacemaker.  He has  had no postprocedural complications.  His lungs are clear except for  diminished breath sounds at the left base.  He is intermittently pacing.  All values are within normal limits on interrogation of the device.  The  patient has had no medicine changes, in fact he has not been having much  chest discomfort at the incision and will take Tylenol for this.  He is  asked to keep his incision dry for the next 7 days and to sponge bathe  until Wednesday, March 30, 2009.   DISCHARGE MEDICATIONS:  1. Enteric-coated aspirin 325 mg daily.  2. Crestor 20 mg daily at bedtime.  3. Altace 10 mg daily.  4. Xibrom 0.09% ophthalmic solution 1 drop twice daily to both eyes.   FOLLOWUP:  All pacemaker followup will be at the [facility]  office.  1. Pacer Clinic, Friday, April 08, 2009 at 9:15 in the morning.  2. To see Dr. Graciela Weeks, Friday, July 01, 2009 at 2 o'clock.   LABORATORY  STUDIES THIS ADMISSION:  Complete blood count:  White cells  7.8, hemoglobin 14.1, hematocrit 41.8, and platelets of 176.  Serum  electrolytes:  Sodium 139, potassium 4.5, chloride 105, carbonate 28,  BUN is 9, creatinine 0.86, and glucose is 100.  Protime 13.9.  INR is 1.  Serum electrolytes, his calcium is 9.6.      Frank Weeks, [place]      Frank Salvia, MD, [facility]  Electronically Signed    Frank Weeks  D:  03/24/2009  T:  03/24/2009  Job:  [id number]   cc:   Frank Weeks, M.D.  Frank Weeks, M.D.  Frank Weeks, M.D.

## 2011-02-27 NOTE — Assessment & Plan Note (Signed)
[facility]                       [place] CARDIOLOGY OFFICE NOTE   Frank Weeks                         MRN:          [id number]  DATE:03/01/2009                            DOB:          12/30/1928    Mr. Frank Weeks comes to our [place] Office to talk to me about his need for  a pacemaker.  Please see previous consultations by Dr. Sherryl Manges.  Several notes in the office from me over the last couple of months as  well as phone message notes.   PROBLEM LIST:  1. 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  He has a modestly broad QRS, which seems to be      worsening.  He has not been really symptomatic other than fatigue      and maybe some dizziness.  2. Ischemic heart disease with a history of previous coronary bypass      grafting.  He has normal left ventricular systolic function.  He      has opted for medical therapy at this point. He has been very      reluctant to have any further procedures.  3. Pleural effusion status post thoracentesis and recent bronchoscopy      with no obvious tumor.  Dr. Edwyna Shell has recommended a video-assisted      thoracic procedure as well as pleurodesis.  He would like him to      have a pacemaker prior to this procedure.  4. Peripheral vascular disease, previous carotid vertebral bypass.  5. Gastroesophageal reflux.   He is no more symptomatic than he was before.  We have placed a monitor  which showed no changes other than his Mobitz II second-degree AV block.  Rates have been down in the high 30s, low 40s.   CURRENT MEDICATIONS:  1. Aspirin 325 mg a day.  2. Fish oil b.i.d.  3. Altace 10 mg per day.  4. Crestor 20 mg per day.   His exam is unchanged.   ASSESSMENT AND PLAN:  I had a long talk again with Mr. Luppino and his wife.  We spent at least 30 minutes or greater talking about being proactive by  having a pacemaker placed to avoid any serious injury to  himself or  others.  It should be noted that he is still driving, I have advised him  not to do so until he has a pacemaker.  I have also advised him not to  have any surgery with Dr. Edwyna Shell until he has a pacemaker.   After a long discussion, he has agreed to proceed with pacemaker  implantation with Dr. Sherryl Manges who knows him.  I have talked to  [facility], RN in Dr. Odessa Fleming office who will make these  arrangements in the near future.  Indications, risks, potential  benefits, etc. were discussed at length again with Mr. Macleod.     Thomas C. Daleen Squibb, MD, [facility]  Electronically Signed    TCW/MedQ  DD: 03/01/2009  DT: 03/02/2009  Job #: [id number]   cc:  Mark A. Perini, M.D.

## 2011-02-27 NOTE — Assessment & Plan Note (Signed)
The patient continues to have intermittent atrial fibrillation. It does not seem particularly rapid. It comprises about 20% of his time. It is not clear whether correlates with his symptoms of particular fatigue. We discussed many options including monitoring which he is disinclined to pursue; empiric antiarrhythmic therapy which I am not inclined to use.  We also discussed anticoagulation and he is adamant about not using Coumadin. He is potentially willing to consider apiobvan when it comes out

## 2011-02-27 NOTE — Letter (Signed)
December 08, 2008    [name], MD, FACC  [address]  [place], [place] [address]   RE:  ELIGIO, ANGERT  MRN:  [id number]  /  DOB:  Dec 02, 1928   Dear Elijah Birk,   It was a pleasure to see Elnora Morrison at your request because of second-  degree heart block.   As you know, he is a 77 year old gentleman with a history of ischemic  heart disease, remote myocardial infarction, bypass surgery in 2007, and  presumably normal left ventricular function.  He had in the fall,  problems with pleural effusions and underwent a thoracentesis, which  actually made him feel worse.  In the followup of that he underwent a CT  scan raising the possibility of cancer.  A PET scan was negative.  He  was then referred to Dr. Edwyna Shell for bronchoscopy to look at this left  lung issue.  Further details are not available.  However, the surgery  was performed on December 06, 2008, in the morning.  An  electrocardiogram, timed 7:38 either appropriately timed or not, neither  before or after the surgery demonstrated 2:1 heart block with a modestly  broad QRS.  Because of that he was called and referred for further  evaluation.   The patient has had a longstanding history of fatigue and in particular  just prior to the surgery had a good deal of listlessness and weakness.  There was also some diaphoresis.  The symptoms improved the day after  the procedure when he came to see [place] yesterday as will be noted below,  he was in sinus rhythm with prolonged first degree AV block.   He has a history of orthostatic-like lightheadedness.   I should also note that it was a protracted cough, which prompted the  evaluation of his lungs.  He is on an ACE inhibitor and that has been  continued.   His exercise tolerance is modestly impaired as I gathered from above.   His ejection fraction at least most recently was normal.   His past medical history in addition to above is notable for,  1. Hypertension.  2.  Dyslipidemia.  3. Prior subarachnoid hemorrhage, as well as right hemispheric      infarcts.  He is status post left carotid vertebral bypass years      ago.   PAST SURGICAL HISTORY:  As noted above.  He has also had cataract  surgery.   REVIEW OF SYSTEMS:  In addition to the above is notable for,  1. GE reflux disease and I should note that his cough was somewhat      improved since initiation of Protonix.  2. Glaucoma.  3. Peripheral vascular history is also notable for an ulcerated aortic      plaque, for which he will be seeing Dr. Hart Rochester.  4. Elevated albumin, which is part of what was involved in the cancer      evaluation and across multiple organ systems was otherwise broadly      negative.   SOCIAL HISTORY:  He is married.  He has 2 children, two grandchildren.  He does not use cigarettes or recreational drugs.  He drinks alcohol  occasionally.   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   CURRENT MEDICATIONS:  Include aspirin 325, Altace 10, and Crestor 20.  I  should note that he was on Toprol postoperatively for his CABG and this  was discontinued possibly because of bradycardia.   PHYSICAL  EXAMINATION:  GENERAL:  He is an elderly Caucasian male  appearing his stated age.  VITAL SIGNS:  His blood pressure is 132/64.  His pulse was 71.  His  weight was 170, which is down 10 pounds since May.  HEENT:  No icterus or xanthoma.  NECK:  His neck veins were flat.  His carotids were brisk and full  bilaterally without bruits.  BACK:  Without kyphosis, scoliosis.  LUNGS:  Clear.  HEART:  Heart sounds were regular with a diminished S1.  There was a 2/6  murmur.  ABDOMEN:  Soft with active bowel sounds.  EXTREMITIES:  Femoral pulses were 2+, distal pulses were intact.  There  is no clubbing, cyanosis, or edema.  NEUROLOGIC:  Grossly normal.  SKIN:  Warm and dry.   Electrocardiogram dated yesterday demonstrated sinus rhythm at 71 with  intervals of 0.29/0.11/0.40, the axis was  39 degrees.   Electrocardiogram from December 06, 2008 demonstrated sinus rhythm at  about 75 beats per minute with 2:1 AV conduction.   Electrocardiogram from Feb 20, 2008, demonstrated second-degree AV block  type 1, concurrent with 2:1 block and the tracing from April 23, 2006,  demonstrated a first-degree AV block of only 210 milliseconds following  the discontinuation of Toprol with which it had been 270 milliseconds on  April 03, 2006.   IMPRESSION:  1. A 2:1 heart block - intermittent.  2. Baseline first-degree AV block with modestly broad QRS - worsening.  3. Ischemic heart disease with,      a.     Prior bypass.      b.     Normal left ventricular function on recent evaluation.  4. Pleural effusion status post thoracentesis, status post recent      bronchoscopy with unclear diagnosis and plan (to me).  5. Peripheral vascular disease with,      a.     Prior carotid vertebral bypass.      b.     Recently identified ulcerated aortic plaque, to be seen Dr.       Hart Rochester.  6. Gastroesophageal reflux disease, recently treated, associated with      improvement in cough.  For chronic cough, on ACE inhibitor therapy.   DISCUSSION:  Tom, Mr. Raczka has significant and symptomatic 2:1 heart  block.  I think it is likely that his symptoms would be improved with  pacing therapy.  My initial concern was that it was related to the  procedure itself as the timing of the electrocardiogram is not clear vis-  a-vis the surgery.  However, on review of electrocardiograms from May,  it is clear that the heart block problem has been ongoing and this may  explain some of the exercise intolerance over the last number of months.  There is clearly worsening of his first degree AV block concurrently.   I wonder also whether his cough has been aggravated by his ACE inhibitor  and I suggested that he discontinue it.  When he sees you in a couple of  weeks, you will be able to determine whether it is worth  resuming it or  trying him on an ARB.   Between now when he sees you, I have asked him to utilize an event  monitor; I anticipate we will see significant periods of high-grade  block.  These may well correlate with symptoms.  I  suspect that he will come to pacing sooner rather than later.  If there  is any I  can do further to help, please do not hesitate to contact me.    Sincerely,      Duke Salvia, MD, [facility]  Electronically Signed    SCK/MedQ  DD: 12/09/2008  DT: 12/10/2008  Job #: [id number]   CC:   Loraine Leriche A. Perini, M.D.  Quita Skye Hart Rochester, M.D.

## 2011-02-27 NOTE — Assessment & Plan Note (Signed)
The patient's device was interrogated.  The information was reviewed. No changes were made in the programming.    

## 2011-02-27 NOTE — Letter (Signed)
January 26, 2009   Mark A. Perini, MD  [address]  [place] [address]   Re:  Frank Weeks, RORKE                  DOB:  [birth year]   Dear Loraine Leriche,   I saw the patient in the office today and chest x-ray showed the left  pleural effusion collapse of the left  lower lobe.  No significant  change.  Blood pressure is 132/77, pulse 78, respirations 17, and  saturations were 98%.  I have strongly recommended that he has surgery  with drainage of the effusion  anddecortication.  He discussed whether  to have a pacemaker or not, and I suggested that he can go back and see  Dr. Daleen Squibb earlier than his June 6th appointment.  I have recommended a  pacemaker would be an appropriate thing to do prior to his having the  surgery.  I will see him back at 3-4 weeks with a chest x-ray.   Ines Bloomer, M.D.  Electronically Signed   DPB/MEDQ  D:  01/26/2009  T:  01/27/2009  Job:  [id number]   cc:   Thomas C. Wall, MD, [facility]

## 2011-02-27 NOTE — Letter (Signed)
December 08, 2008   Mark A. Perini, MD  [address]  [place]  [place] [address]   Re:  BAYAN, KUSHNIR                  DOB:  March 19, 1929   Dear Loraine Leriche,   I saw the patient back in the office today after we did a bronchoscopy  under local.  His bronchoscopy showed no endobronchial lesions and I  feel that he probably has a trapped left lower lobe secondary to scar  tissue and a chronic left lower lobe effusion and probably also with  some affect in the lingular segment of the left upper lobe.  I cannot be  100% sure he does not have cancer, but this looks more like a chronic  process that is probably was related to a chronic effusion after his  heart surgery 2 years ago.  I have discussed this in great detail with  the patient and we will see him back in 2 weeks with a chest x-ray.  Immediately postbronchoscopy, he felt very weak at home.  We reviewed  his EKG and it was found that he was in 2:1 heart block even though it  was originally read as sinus bradycardia, so he is being seen by Dr.  Daleen Squibb to re-adjust his medications and this could probably explain his  weakness after his bronchoscopy.  I have recommended that he get a  decortication of this area of VATS decortication, but he is somewhat  reluctant to have the surgery since he is not at all symptomatic, but I  will re-discuss this with him in 2 weeks.  I appreciate the opportunity  of seeing the patient.   Sincerely,   Ines Bloomer, M.D.  Electronically Signed   DPB/MEDQ  D:  12/08/2008  T:  12/09/2008  Job:  [id number]   cc:   Thomas C. Wall, MD, [facility]

## 2011-02-27 NOTE — Op Note (Signed)
NAME:  Frank Weeks, Frank Weeks                  ACCOUNT NO.:  [id number]   MEDICAL RECORD NO.:  [id number]          PATIENT TYPE:  INP   LOCATION:  3739                         FACILITY:  [facility]   PHYSICIAN:  Duke Salvia, MD, FACCDATE OF BIRTH:  1929/01/18   DATE OF PROCEDURE:  03/23/2009  DATE OF DISCHARGE:                               OPERATIVE REPORT   PREOPERATIVE DIAGNOSIS:  First to second-degree heart block associated  with bradycardia and fatigue.   POSTOPERATIVE DIAGNOSIS:  First to second-degree heart block associated  with bradycardia and fatigue.   PROCEDURE:  Dual-chamber pacemaker implantation.   Following obtaining informed consent, the patient was brought to the  Electrophysiology Laboratory and placed on the fluoroscopic table in  supine position.  After routine prep and drape of the left upper chest,  lidocaine was infiltrated into the prepectoral subclavicular region.  An  incision was made and carried down to the layer of the prepectoral  fascia using electrocautery and sharp dissection.  A pocket was  performed similarly.  Hemostasis was obtained.   Thereafter, attention was turned gaining access to extrathoracic left  subclavian vein which was accomplished without difficulty and without  the aspiration of air or puncture of artery.  Two separate venipunctures  were accomplished.  Guidewires were placed and retained and sequentially  7-French sheath were placed through which we passed a St. Jude 58-cm  active fixation ventricular lead, serial number [id number] and a 2088, 52-  cm active fixation atrial lead, serial number [id number].  Under  fluoroscopic guidance, these leads were manipulated in the right  ventricular septum and the right atrial appendage respectively.  The  bipolar P-wave was 1.5 with a pacing impedance of 660 ohms, a threshold  0.4 volts at 0.5 milliseconds, current threshold 2.6 MA.  There was no  diaphragmatic pacing at 10 volts and the current of  injury was brisk.   The bipolar R-wave was on final manipulation.  We because he was going  to be ventricularly paced saw hard to get the lead onto the septum and  it took a number of attempts to make sure it was not to anterior on the  septum.  In its final location what appeared to be the mid septum, the R-  wave was 8.9 with a pace impedance of 771, a threshold of 0.2 volts at  0.5 milliseconds, current threshold of 1.8 MA.  There was no  diaphragmatic pacing at 10 volts and the current of injury was brisk.  Both leads were secured to the prepectoral fascia and the ventricular  lead was marked with a tie.  We then attached to a St. Jude Accent RF  pulse generator, model [id number], serial number [id number].  P-synchronous  pacing was identified.  The pocket was copiously irrigated with  antibiotic-containing saline solution.  Hemostasis was assured.  Both  leads and pulse generator were placed in the pocket and secured to the  prepectoral  fascia.  The wound was then closed in 3 layers in a normal fashion.  The  wound was washed and dried and a benzoin  and Steri-Strip dressing was  applied.  Needle counts, sponge counts, and instrument counts were  correct at the end of the procedure according to the staff.  The patient  tolerated the procedure without apparent complication.      Duke Salvia, MD, [facility]  Electronically Signed     Duke Salvia, MD, [facility]  Electronically Signed    SCK/MEDQ  D:  03/23/2009  T:  03/24/2009  Job:  [id number]

## 2011-03-02 NOTE — Cardiovascular Report (Signed)
NAME:  Frank Weeks, CALLENDER NO.:  [id number]   MEDICAL RECORD NO.:  [id number]          PATIENT TYPE:  OIB   LOCATION:  1962                         FACILITY:  [facility]   PHYSICIAN:  Charlton Haws, M.D.     DATE OF BIRTH:  1929-07-09   DATE OF PROCEDURE:  02/18/2006  DATE OF DISCHARGE:                              CARDIAC CATHETERIZATION   INDICATIONS:  Crescendo angina.   FINDINGS:  Catheterization was done with 4-French catheter from the right  femoral artery.   The distal left main coronary artery had a 40% discrete stenosis.   The ostium of the LAD had a 60% tubular stenosis.  Mid and distal LAD had 30-  40% lesions.   The ostium of the circumflex coronary artery had an 80% lesion.   There is a very high takeoff first obtuse marginal branch with a 90% tubular  lesion.  This was a large vessel.  The distal circumflex had 50% tubular  disease and there were small second and third obtuse marginal branches.   The right coronary artery had 60-70% diffuse disease proximally.  The mid  and distal vessel had 30-40% lesions.  The PDA system was small.   RAO ventriculography:  RAO ventriculography showed minimal global  hypokinesis with an EF of 50%.  There is never gradient across the aortic  valve and no MR.  Aortic pressure is 130/63.  LV pressure is 130/16.   IMPRESSION:  I spoke with Dr. Juanda Chance who will review the films and discuss  them with Dr. Daleen Squibb.  The patient has been very hesitant to even have a heart  catheterization over the years and certainly does not want coronary artery  bypass graft.   However, I think that the ostium of the circumflex would preclude  percutaneous intervention.  In regards to symptoms, the high takeoff obtuse  marginal is certainly the culprit lesion.   If Dr. Juanda Chance feels that he can get to his through the ostium of the  circumflex, a percutaneous intervention may help.   However, I think the patient will probably ultimately be  best served with  coronary artery bypass graft.   I will make an appointment for the patient to followup with Dr. Daleen Squibb this  week and Dr. Juanda Chance will discuss the films with him.           ______________________________  Charlton Haws, M.D.    PN/MEDQ  D:  02/18/2006  T:  02/19/2006  Job:  [id number]

## 2011-03-02 NOTE — Assessment & Plan Note (Signed)
[facility]                              CARDIOLOGY OFFICE NOTE   Frank, Weeks                         MRN:          [id number]  DATE:06/27/2006                            DOB:          03-10-1929    HISTORY OF PRESENT ILLNESS:  Frank Weeks comes in today.  He is now four months  post coronary bypass graft with Sheliah Plane, MD.   He is having no angina and some mild chest wall soreness and fatigue in the  evening.  He is doing remarkably well.   He had numerous questions today, all of which I answered.  It took about 20  minutes.   MEDICATIONS:  1. Crestor 10 mg daily.  2. Zetia 10 mg.  3. Aspirin 325 mg daily.  4. Fish oil b.i.d.  5. Altace 10 mg daily.   PHYSICAL EXAMINATION:  VITAL SIGNS:  Blood pressure 120/74, pulse 73 and  regular, weight is 175.  Physical examination, otherwise, unchanged.   ASSESSMENT/PLAN:  Frank Weeks has done well.  We will continue his current  medications.  He would like to drop the dose of his Altace.  I told him this  is fine, as long as he is watching his blood pressure closely.   We will plan on seeing him back in June 2008.                               Thomas C. Daleen Squibb, MD, [facility]    TCW/MedQ  DD:  06/27/2006  DT:  06/28/2006  Job #:  [id number]   cc:   Loraine Leriche A. Perini, M.D.

## 2011-03-02 NOTE — Consult Note (Signed)
NAME:  Frank Weeks, Frank Weeks                  ACCOUNT NO.:  [id number]   MEDICAL RECORD NO.:  [id number]          PATIENT TYPE:  INP   LOCATION:  3011                         FACILITY:  [facility]   PHYSICIAN:  Quita Skye. Hart Rochester, M.D.  DATE OF BIRTH:  [birth year]   DATE OF CONSULTATION:  09/16/2004  DATE OF DISCHARGE:                                   CONSULTATION   CHIEF COMPLAINT:  Acute right brain CVA by MRI scan with severe right  internal carotid stenosis.   HISTORY OF PRESENT ILLNESS:  This 77 year old gentleman developed a right  periorbital headache and diplopia 24 hours ago.  He came to the [facility]  Emergency Room.  Subsequent MRI scan revealed evidence of an acute right  parietal and occipital stroke with evidence of an old, right frontal and  cerebellar stroke.  The appearance was that of a watershed type stroke in  the acute phase.  He denied any recent right hemispheric TIAs such as  hemiparesis, aphasia, transient sensation, amaurosis fugax, etc. He does  have a history of subarachnoid hemorrhage in 1984, which did not require  surgery.  In 1988, had a stroke and following that, underwent a left carotid  vertebral anastomosis.   PAST MEDICAL HISTORY:  1.  Hyperlipidemia.  2.  Hypertension.  3.  Myocardial infarction 1994 with occasional angina pectoris.  4.  Previous stroke; see present illness.  5.  Hypertension.   ALLERGIES:  None known.   SOCIAL HISTORY:  Does not use tobacco and only very rare alcohol.   MEDICATIONS:  1.  Altace 10 mg q.o.d.  2.  Aspirin 81 mg daily.  3.  Crestor 10 mg daily.   PHYSICAL EXAMINATION:  VITAL SIGNS:  Blood pressure 137/60, heart rate 63,  respirations 20.  GENERAL:  He is an alert and oriented male who in no acute distress.  NECK:  Supple, 3+ carotid pulses.  NEUROLOGIC:  Reveals a right 6th nerve palsy.  No palpable adenopathy in the  neck.  LOWER EXTREMITIES:  Pulses 3+, femoral, popliteal and dorsalis pedis pulses  bilaterally with  no evidence of any edema or venostasis disease.  LUNGS:  Clear to auscultation.  CARDIOVASCULAR:  Regular rate and rhythm.  ABDOMEN:  Soft, nontender with no masses.   HOSPITAL TESTS:  MRA exam was reviewed which revealed segmental, severe  stenosis in the proximal right internal carotid artery with no evidence of  left internal carotid stenosis. The right vertebral artery is smaller than  the left and has some tapering near its origin. The left vertebral artery  has been reimplanted into the proximal left common carotid artery. The  proximal 3 cm are not well visualized, but there is antegrade flow.  Carotid  duplex exam confirms a high grade right carotid stenosis approximating 90%,  with antegrade flow in both vertebral arteries and no apparent stenosis  where the vertebral has been anastomosed to the left common carotid on  duplex scanning.   IMPRESSION:  1.  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  2.  Acute right 6th nerve palsy.  3.  History of subarachnoid hemorrhage.  4.  Coronary artery disease, previous myocardial infarction 1994.  5.  Severe proximal right internal carotid stenosis.   RECOMMENDATIONS:  Agree that the patient's does need a right carotid  endarterectomy and I believe, based on his current physical exam and MRI  study, that this could be performed during this hospitalization.  Will await  cardiology input and further discussion by neurology about timing. This  could be performed early next week.   I discussed this with the patient and he is agreement.       JDL/MEDQ  D:  09/16/2004  T:  09/17/2004  Job:  [id number]   cc:   Deanna Artis. Sharene Skeans, M.D.  [address]  [address]  [place]  [place] [address]  Fax: [phone]

## 2011-03-02 NOTE — Consult Note (Signed)
NAME:  URI, TURNBOUGH NO.:  [id number]   MEDICAL RECORD NO.:  [id number]          PATIENT TYPE:  INP   LOCATION:  3011                         FACILITY:  [facility]   PHYSICIAN:  Charlton Haws, M.D.     DATE OF BIRTH:  11-18-1928   DATE OF CONSULTATION:  09/17/2004  DATE OF DISCHARGE:                                   CONSULTATION   Frank Weeks is a 77 year old patient of Dr. Daleen Squibb.  He needs preop clearance  for right carotid endarterectomy.  He has a long history of cerebral  problems.  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  He has had multiple  previous CVAs.  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  He was found to have a  high-grade, greater than 95% right carotid stenosis.   He has seen Dr. Hart Rochester before and had a previous left-vertebral-to-common-  carotid-artery anastomosis.   In regard to his heart, he says he was last seen by Dr. Daleen Squibb about a year  ago; however, he has not had a stress test in a long time.  He does get  occasional angina, and this is described as exertional chest pain.   The patient had a heart attack in 1994.  He said he had a heart  catheterization but does not recall having angioplasty or stents.   I explained to him that, given his chest pain, history of MI, and need for  vascular surgery, that he would have to have an adenosine Cardiolite study  prior to any surgery.  He seemed a little disappointed as Dr. Hart Rochester was  planning on operating on him tomorrow, and we received the consult at 11:15  on Sunday, and all of the Cardiolite dosages were already taken for the day.   His Review of Systems is otherwise unremarkable.   He is a retired Comptroller.  He is fairly active and walks on a  regular basis.   He does not smoke or drink.   His medications before admission included Altace, Zocor, and an aspirin.   I did note that he is on Plavix here in the hospital due to some of his  diplopia and sixth nerve palsy.   On  physical examination, blood pressure 130/70, pulse 70 and regular.  Lungs  are clear. Carotids:  I cannot hear bruits.  He has a small scar over the  left clavicle from his previous surgery.  There is an S1, S2 with normal  heart sounds. Abdomen is benign.  Lower extremities have intact pulses.  There is no edema.   There is no EKG on the chart.  Labs are otherwise unremarkable.   IMPRESSION:  1.  Known vascular disease.  2.  Distant myocardial infarction in 1994 with chest pain.   Risk stratifying Cardiolite study in the morning.  Dr. Hart Rochester will address  the need to stop Plavix before vascular surgery.   It may be worthwhile to start a beta blocker low-dose prior to this surgery.  So long as his Cardiolite study is not high-risk, I think he  can proceed  with the surgery on Wednesday.       PN/MEDQ  D:  09/17/2004  T:  09/17/2004  Job:  [id number]

## 2011-03-02 NOTE — Assessment & Plan Note (Signed)
[facility]                                 ON-CALL NOTE   [name], [name]                         MRN:          [id number]  DATE:12/25/2008                            DOB:          03-14-1929    CARDIOLOGIST:  [name], MD, FACC   HISTORY:  I was called from [employer] tonight on Frank Weeks regarding his  event monitor.  I was faxed the strips and appears that he has had first-  degree AV block as well as second-degree AV block with Wenckebach.  I  reviewed the strips with Dr. Antoine Poche, who agreed.  There are some  episodes of 2:1 block, but he felt that the patient continued to exhibit  signs of mainly Wenckebach.  The patient was asymptomatic during the  episode.  His followup strips did demonstrate sinus rhythm with first-  degree AV block.  The strips have been faxed to the office and I have  left a message at the office for them to be reviewed by Dr. Daleen Squibb,  Monday, and for the patient to be contacted should earlier followup be  recommended.      Frank Newcomer, PA-C  Electronically Signed      Rollene Rotunda, MD, [facility]  Electronically Signed   SW/MedQ  DD: 12/25/2008  DT: 12/26/2008  Job #: [id number]

## 2011-03-02 NOTE — Procedures (Signed)
[facility]  Patient:    Frank Weeks, Frank Weeks                         MRN: [id number] Proc. Date: 03/24/01 Adm. Date:  [id number] Attending:  Rich Brave CC:         Rodrigo Ran, M.D.   Procedure Report  PROCEDURE:  Colonoscopy.  INDICATION:  Screening for colon cancer in an asymptomatic patient.  FINDINGS:  Normal exam to the cecum.  DESCRIPTION OF PROCEDURE:  The nature, purpose, and risks of the procedure had been discussed with the patient who provided written consent.  Sedation was fentanyl 50 mcg and Versed 7 mg IV without arrhythmias or desaturation. Digital exam of the prostate was unremarkable.  The Olympus adjustable-tension video colonoscope was advanced without significant difficulty to the cecum, and pullback was then performed.  The quality of the prep was excellent, and it is felt that all areas were well seen.  This was a normal examination.  No polyps, cancer, colitis, vascular malformations, or diverticulosis were observed.  Dr. Jacobo Forest of the family medicine house staff participated in this exam and did retroflexion of the rectum which was unremarkable and advanced the scope to about 70 cm for a recheck of the rectosigmoid region and descending colon and disclosed no additional findings under my supervision.  No biopsies were obtained.  The patient tolerated this procedure well, and there were no apparent complications.  IMPRESSION:  Normal screening colonoscopy.  PLAN:  Consider screening flexible sigmoidoscopy in five years. DD:  03/24/01 TD:  03/24/01 Job: [id number]

## 2011-03-02 NOTE — Assessment & Plan Note (Signed)
[facility]                                 ON-CALL NOTE   VERTIS, SCHEIB                         MRN:          [id number]  DATE:12/23/2008                            DOB:          02-27-1929    PRIMARY CARDIOLOGIST:  [name], MD, [facility]   I received a call from Debbie with [employer] this evening, reporting  that Mr. Lindner is being monitored by them and this evening has been noted  to have not only a second-degree type 1 AV block, but also second-degree  type 2 AV block.  They had her fax these records to me and it does  appear that this is accurate.  Conversely, this could all be Wenckebach  with a varied degree of dropped beats.  The patient, by report from  [employer], has been asymptomatic throughout this.  His heart rates have  been running predominantly in the 40s-50s with occasional drops in the  high 30s.  I called the patient at both phone numbers and one is a work  number, the other is his home number [phone].  There was no answer and  no opportunity to leave a message.  A note from Dr. Vern Claude, most recent  clinic note, the patient previously was found to have some Wenckebach  and previously expressed no interest in pacemaker placement.  We will  leave these records for Dr. Vern Claude review in the morning.     Nicolasa Ducking, ANP  Electronically Signed    CB/MedQ  DD: 12/23/2008  DT: 12/24/2008  Job #: [id number]

## 2011-03-02 NOTE — Assessment & Plan Note (Signed)
[facility]                            CARDIOLOGY OFFICE NOTE   Frank Weeks, Frank Weeks                         MRN:          [id number]  DATE:01/28/2007                            DOB:          Dec 27, 1928    Frank Weeks comes in today now nearly a year out from bypass surgery.  He  had diffuse coronary artery disease and had a small distal right.  He  had a vein graft placed at the right large intermediate and a mammary  placed to the LIMA.  He had normal left ventricular systolic function.  Unfortunately, he is still having some angina when he over does it or  lifts things.  His chest wall soreness has resolved.   He has multiple complaints today including hip pain, leg pain, weakness,  fatigue.  He wants to blame it on his drugs.   MEDICATIONS:  1. Crestor 10 mg a day.  2. Zetia 10 mg a day.  3. Aspirin 325 mg a day.  4. Fish oil b.i.d.  5. Altace 10 mg a day.   PHYSICAL EXAMINATION:  VITAL SIGNS:  Blood pressure today is 143/71,  pulse 66 and regular.  EKG is unchanged.  Weight is up 7 pounds to 182.  HEENT:  Normocephalic, atraumatic.  PERRL.  Extraocular movements  intact.  Sclerae clear.  Facial symmetry is normal.  Dentition is  satisfactory.  NECK:  Supple.  No JVD.  Carotids are full without bruits.  There is no  thyromegaly.  There is no tracheal deviation.  CHEST:  Sternotomy site is stable.  There is no chest wall soreness or  tenderness.  CARDIAC:  PMI nondisplaced.  Normal S1, S2.  LUNGS:  Clear to auscultation.  ABDOMEN:  Slightly protuberant.  Good bowel sounds.  EXTREMITIES:  No edema.  Pulses intact.  No muscle soreness.  NEUROLOGIC:  Intact.   ASSESSMENT/PLAN:  Frank Weeks unfortunately continues to have a lot of  chronic complaints.  After a long discussion, I told him to hold the  Zetia for a few weeks to see if he got better.  If he does, he will  follow up with Dr. Waynard Edwards for further input.  I do not think he will  tolerate a  higher dose of Crestor knowing his phobia of medications.  In  addition, I told him to watch his blood pressure.   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  I have given him a  release to do so.  If he has angina he will stop.  Hopefully, he will be  limited very little.  I suspect the vein graft to distal right may have  not taken being a small vessel.  I offered repeat such as a diagnostic  catheterization, but he would really not like  to do this.  It took a long time just to talk him into having a  catheterization and then surgery.  I will see him back in 6 months.  Thomas C. Daleen Squibb, MD, [facility]  Electronically Signed    TCW/MedQ  DD: 01/28/2007  DT: 01/28/2007  Job #: [id number]   cc:   Loraine Leriche A. Perini, M.D.

## 2011-03-02 NOTE — Op Note (Signed)
NAME:  Frank Weeks, Frank Weeks                  ACCOUNT NO.:  [id number]   MEDICAL RECORD NO.:  [id number]          PATIENT TYPE:  INP   LOCATION:  3301                         FACILITY:  [facility]   PHYSICIAN:  Quita Skye. Hart Rochester, M.D.  DATE OF BIRTH:  12/08/1928   DATE OF PROCEDURE:  09/20/2004  DATE OF DISCHARGE:                                 OPERATIVE REPORT   PREOPERATIVE DIAGNOSIS:  Recent right hemispheric watershed cerebrovascular  accident with severe right internal carotid stenosis.   POSTOPERATIVE DIAGNOSIS:  Recent right hemispheric watershed cerebrovascular  accident with severe right internal carotid stenosis.   OPERATION:  Right carotid endarterectomy with Tycron patch angioplasty.   SURGEON:  Quita Skye. Hart Rochester, M.D.   FIRST ASSISTANT:  Claudette Krell, N.P.   ANESTHESIA:  General endotracheal.   BRIEF HISTORY:  This patient was admitted with visual symptoms and was found  to have a sixth nerve palsy on the right.  An MRI scan also revealed  multiple acute watershed type strokes in the right parietal lobe and he was  found to have a 95% right internal carotid stenosis.  He was scheduled for  right carotid endarterectomy.  His left carotid artery was widely patent.   PROCEDURE:  The patient was taken to the operating room, placed in the  supine position, at which time satisfactory general endotracheal anesthesia  was administered.  The right neck was prepped with Betadine scrubbing  solution, draped in the routine sterile manner.  An incision was made along  the anterior border of the sternocleidomastoid muscle and carried down  through subcutaneous tissue and platysma using the Bovie.  The common facial  vein, external jugular vein was ligated with 3-0 silk ties and divided,  exposing the common, internal and external carotid arteries.  Care was taken  not to enter the vagus or hypoglossal nerves, both of which were exposed.  There was a calcified atherosclerotic plaque at the  carotid bifurcation  extending up the internal carotid artery about 3-4 cm, distal vessel  appeared normal.  A #10 shunt was prepared and the patient was heparinized.  The carotid vessels were occluded with vascular clamps, a longitudinal  opening made in the common carotid with a 15 blade, extended up the internal  carotid with a Potts scissors to a point distal to the disease.  The plaque  was severely ulcerative in nature and about 90-95% stenotic but the distal  vessel appeared normal.  A #10 shunt was inserted without difficulty,  reestablishing flow in about 2 minutes.  A standard endarterectomy was then  performed using the elevator and a Potts scissors with an inversion  endarterectomy of the external carotid.  The plaque feathered off the distal  internal carotid artery nicely, not requiring any tacking sutures.  The limb  was thoroughly irrigated with heparin and saline and all loose debris  carefully removed __________ was closed with a patch using continuous 6-0  Prolene.  Prior to completion of the closure the shunt was removed.  After  about 30 minutes of shunt time following antegrade and retrograde flushing,  the closure was completed with reestablishment __________ at the external  and at the internal branch.  Carotid was occluded for less than 2 minutes  for removal of the shunt.  Protamine was then  given to reverse the heparin.  Following adequate hemostasis, the wound was  irrigated with saline, closed in layers with Vicryl in a subcuticular  fashion.  Sterile dressing applied.  The patient was taken to the recovery  room in satisfactory condition.       JDL/MEDQ  D:  09/20/2004  T:  09/20/2004  Job:  [id number]

## 2011-03-02 NOTE — H&P (Signed)
NAME:  Frank Weeks, Frank Weeks                  ACCOUNT NO.:  [id number]   MEDICAL RECORD NO.:  [id number]          PATIENT TYPE:  INP   LOCATION:  3011                         FACILITY:  [facility]   PHYSICIAN:  Genene Churn. Love, M.D.    DATE OF BIRTH:  1929-02-04   DATE OF ADMISSION:  09/15/2004  DATE OF DISCHARGE:                                HISTORY & PHYSICAL   HISTORY OF PRESENT ILLNESS:  This is one of multiple [facility]  admissions for this 77 year old right-handed white married male from  [place], [place], admitted from the emergency room at [facility] and transferred to [facility] for evaluation of  right sixth nerve palsy and abnormal MRI indicative of occipital lobe  strokes on the right.   The patient has a history of a subarachnoid hemorrhage in 1963 for which he  was treated by Dr. Ples Specter, neurosurgeon in [place].  The patient  did not require surgery at that time, but he states he was told he did have  a brain aneurysm.  He said that the aneurysm cured itself.  Subsequently in  1988, he had a blocked artery in his neck and presented with diaphoresis and  dizziness with diffuse weakness and apparently, he had extracranial vascular  disease requiring surgery by Quita Skye. Hart Rochester, M.D.  The patient states his  vertebral artery was hooked to his carotid.  In 1994, he did have a  myocardial infarction and for the last two years, he has had hypertension  and elevated cholesterol.  The morning of admission, he developed right  orbital headache associated with horizontal diplopia worse looking to the  right and relieved by closing either eye.  This was associated with nausea  and vomiting, but no chest pain, palpitations, syncope, or seizure.  He was  seen at [facility] emergency room, where a CT scan  showed evidence of old right frontal and right cerebellar strokes.  MRI of  the brain showed old right frontal, left  frontal, and right cerebellar  strokes, and MRA showed no definite evidence of an aneurysm.  There was  evidence of acute stroke on the MRI in the right occipital and right deep  white matter region, mostly in the right PCA and right MCA distribution.  These were very small.  The patient had been on aspirin and denied any  history of drug abuse.   PAST MEDICAL HISTORY:  Significant for hyperlipidemia, hypertension,  subarachnoid hemorrhage, stroke in 1988, and myocardial infarction in 1994,  hypertension for two years.   MEDICATIONS:  1.  Altace 10 mg every other day.  2.  Aspirin 81 mg daily.  3.  Crestor 10 mg daily.   SOCIAL HISTORY:  He finished high school and technical school.  He is  retired from working in [employer].  He does not drink alcohol nor does he smoke  cigarettes.   ALLERGIES:  No known drug allergies.   FAMILY HISTORY:  His mother died at 73 from myocardial infarction and had  lung cancer.  His father  died at 72 from myocardial infarction.  He has a  brother, 48.  He has a twin brother, 43, who has a heart condition.  There  is positive family history of tremor.   PHYSICAL EXAMINATION:  GENERAL:  A well-developed white male.  VITAL SIGNS:  Blood pressure lying right and left arm of 160/80, heart rate  64, there were no bruits.  He is status post left neck surgery.  NEUROLOGY:  Mental status; he is alert and oriented x3.  His cranial nerve  examination revealed right ptosis, right sixth nerve palsy, tongue midline,  uvula midline, gag is present.  Sternocleidomastoid, trapezius testing  intact.  Motor examination was 5/5 strength in the upper and lower  extremities.  He had outstretched hand and arm tremor.  Coordination testing  revealed finger-to-nose, heel-to-shin, rapid alternating movements to be  normal.  Sensory examination was intact to pinprick, touch, position, and  vibration testing.  Both tympanic membranes could not be seen.  There was  wax in both  ears.  His tongue was midline.  The mouth was in good repair.  LUNGS:  Clear to auscultation.  HEART:  No murmurs.  ABDOMEN:  Bowel sounds were normal.  There was no organomegaly.   LABORATORY DATA:  White blood cell count 10,200, hemoglobin 14.8, hematocrit  42.9, platelet count 221,000. Sodium 138, potassium 4.4, chloride 107, CO2  27, glucose 120, BUN 9, creatinine 0.9, total protein 7.1, albumin 3.3.  His  pro-time was 16.8 with an INR that was slightly high at 1.5.  His PTT was  not found.   IMPRESSION:  1.  Right sixth nerve palsy.  Code 378.54.  2.  Acute right occipital scattered strokes.  Code 434.1.  3.  History of subarachnoid hemorrhage.  Code 431.  4.  History of ischemic stroke.  Code 434.1.  5.  Hypertension.  Code 796.  6.  Coronary artery disease.  Code 429.9.  7.  Hyperlipidemia.  Code 272.4.  8.  Hypertension.  Code 796.2.  9.  Tremor.  Code 333.1.   PLAN:  To admit the patient to [facility] for further evaluation to  include extracranial MRA and two-dimensional echocardiogram of the heart.       JML/MEDQ  D:  09/15/2004  T:  09/16/2004  Job:  [id number]

## 2011-03-02 NOTE — Op Note (Signed)
NAME:  Frank Weeks, Frank Weeks NO.:  [id number]   MEDICAL RECORD NO.:  [id number]          PATIENT TYPE:  INP   LOCATION:  2037                         FACILITY:  [facility]   PHYSICIAN:  Sheliah Plane, MD    DATE OF BIRTH:  [birth year]   DATE OF PROCEDURE:  03/18/2006  DATE OF DISCHARGE:  03/23/2006                                 OPERATIVE REPORT   PREOPERATIVE DIAGNOSIS:  Coronary occlusive disease.   POSTOPERATIVE DIAGNOSIS:  Coronary occlusive disease.   SURGICAL PROCEDURE:  Coronary artery bypass grafting x3 with the left  internal mammary to the left anterior descending coronary artery, reverse  saphenous vein graft to the intermediate coronary artery, reverse saphenous  vein graft to the distal right coronary artery with right Endo-vein harvest  system.   SURGEON:  Sheliah Plane, M.D.   ASSISTANT:  Jerold Coombe, P.A.   INDICATIONS FOR PROCEDURE:  The patient is a 77 year old male with a  previous history of a myocardial infarction 18 years previously.  He has  been treated medically over the years and has done well until recently he  had increasing symptoms of recurrent angina which was progressively  increasing in severity and frequency.  He underwent a cardiac  catheterization which revealed a high-grade LAD and diagonal disease and  high-grade right coronary artery obstruction.  Because of this the overall  LV function was preserved.  Because of his symptoms and significant three-  vessel disease, coronary artery bypass grafting was recommended to the  patient.  He agreed and signed an informed consent.   DESCRIPTION OF PROCEDURE:  With a Swan-Ganz and arterial line monitoring  placed, the patient underwent general endotracheal anesthesia without  incident.  The skin of the chest and legs was prepped with Betadine and  draped in the usual sterile manner.  Using the Guidant Endo-vein harvest  system, the vein was harvested from the right thigh.  It  was of good quality  and caliber.  A median sternotomy was performed.  The left internal mammary  artery was dissected down as a pedicle graft and the distal artery was  divided and had good free flow.  The pericardium was opened.  Overall  ventricular function appeared preserved.  The patient was systemically  heparinized.  The ascending aorta and the right atrium were cannulated.  The  aortic root vent cardioplegia needle was introduced into the ascending  aorta.  The patient was placed on cardiopulmonary bypass at 2.4 liters per  minute per sq/mm.  The sites of the anastomosis were inspected and were free  of bleeding.  We had entertained performing the bypass off-pump, but because  of the inter-myocardial position of the intermediate and the LAD was also  inter-myocardial, it was decided to proceed with on-pump bypass.  The sites  of the anastomoses were dissected.  The patient's body temperature was  cooled to 30 degrees.  The aortic crossclamp was applied and 500 mL of cold  blood potassium cardioplegia was administered with a rapid diastolic arrest  of the heart.  Myocardial and septal temperatures were  monitored throughout  the crossclamp.   Attention was turned first to the distal right coronary artery which was  opened and admitting a 0.5 mm probe and using a running #7-0 Prolene a  distal anastomosis was performed.  Attention was then turned to the  intermediate coronary artery which was opened in the midline, admitting a  0.5 mm probe.  This vessel was inter-myocardial.  Using a running #7-0  Prolene a distal anastomosis was performed.  A very distal branch of the  circumflex was identified, but was less than a 1 mm vessel and not felt  suitable for bypass.  Attention was then turned to the left anterior  descending coronary artery which was dissected out of its inter-myocardial  position and identified.  It was a good quality vessel and admitted a 1.5 mm  probe distally.   Using a running #8-0 Prolene the left internal mammary  artery was anastomosed to the left anterior descending coronary artery.  With release of the Edwards bulldog on the mammary artery, there was a  prompt rise in the myocardial septal temperature.  With the crossclamp still  in place, two punch aortotomies were performed.  Each of the two vein grafts  were anastomosed to the ascending aorta.  With the completion of the final  proximal anastomosis the heart was allowed to passively fill and de-air.  The crossclamp was removed.  The total crossclamp time was 64 minutes.  The  patient spontaneously converted to a sinus rhythm.  The sites of the  anastomoses were inspected and were free of bleeding.  The patient was then  ventilated and weaned from cardiopulmonary bypass without difficulty and  remained hemodynamically stable.  He was decannulated in the usual fashion.  Protamine sulfate was administered.  Atrial and ventricular pacing wires  were applied.  Graft markers were applied.  A left pleural tube and a single  Blake mediastinal tube were left in place.  The pericardium was  reapproximated.  The sternum was closed with #6 stainless steel wires.  The  fascia was closed with interrupted #0 Vicryl and #1-0 and #3-0 Vicryl in the  subcutaneous tissue, with #4-0 subcuticular stitch in the skin edges.  Dry  dressings were applied.  The sponge and needle counts were reported as  correct at the completion of the procedure.   The patient tolerated the procedure without obvious complications and was  transferred to the surgical intensive care unit for further postoperative  care.  The total pump time was 85 minutes.      Sheliah Plane, MD  Electronically Signed     EG/MEDQ  D:  03/23/2006  T:  03/23/2006  Job:  [id number]   cc:   Thomas C. Wall, M.D.  [address]  [address]  [place]  [place] [address]

## 2011-03-02 NOTE — Discharge Summary (Signed)
NAME:  Frank Weeks, Frank Weeks NO.:  [id number]   MEDICAL RECORD NO.:  [id number]          PATIENT TYPE:  INP   LOCATION:  2037                         FACILITY:  [facility]   PHYSICIAN:  Sheliah Plane, MD    DATE OF BIRTH:  1929-07-13   DATE OF ADMISSION:  03/18/2006  DATE OF DISCHARGE:  03/23/2006                                 DISCHARGE SUMMARY   ADMISSION DIAGNOSIS:  Severe two-vessel coronary artery disease.   DISCHARGE/SECONDARY DIAGNOSES:  1.  Severe two-vessel coronary artery disease, status post coronary artery      bypass graft.  2.  History of right hemispheric punctate infarct involving the cortex and      the deep white matter consistent with watershed stroke.  3.  History of right internal carotid artery stenosis, status post carotid      endarterectomy in December 2005 by Frank Weeks.  4.  Hyperlipidemia.  5.  History of hypertension.  6.  Subarachnoid hemorrhage at age 4.  7.  History of stroke in 41.  8.  History of left carotid vertebral bypass done 17 years ago.  9.  Allergy to LIPITOR, which causes a rash.  10. Question of right lung nodule per chest x-ray with a CT scan      demonstrating nodules most consistent with granulomata.  11. Probable simple cyst of the right lobe of the liver per CT scan.   PROCEDURES:  March 18, 2006, coronary artery bypass grafting x3 using the left  internal mammary artery to the left anterior descending artery, saphenous  vein graft to the right coronary, saphenous vein graft to the intermediate,  with endoscopic vein harvesting from the right leg.  Surgeon:  Sheliah Plane, MD   BRIEF HISTORY:  Frank Weeks is a 77 year old Caucasian male with known history  of coronary artery disease, having had acute myocardial infarction 13 years  ago.  Since that time he has had no cardiac catheterization and has done  well.  However, over the past 6-8 weeks he noted increasing episodes of  chest pain with exertion, especially  with lifting.  Pain radiated to his  shoulders and was relieved by rest.  He had no associated nausea, vomiting  or diaphoresis.  He does not smoke.  He was initially seen by Frank Fus C.  Weeks, M.D.  He underwent cardiac catheterization, which showed very diffuse  small-vessel disease.  There was 60% stenosis of the proximal LAD, the  ostial circumflex had an 80% lesion, there was a high takeoff of the first  obtuse marginal with a 90% lesion, which was a relatively large vessel.  The  distal circumflex had two small obtuse marginal branches that were not  bypassable.  The right coronary artery had diffuse 60-80% disease  proximally.  Ejection fraction was slightly decreased at 50%.  There was no  aortic valve gradient.  Based on these results and his increasing symptoms  of angina, Frank Weeks recommended coronary artery bypass graft surgery.  Of note, a chest x-ray done at [facility] showed a questionable  right lung  nodule, and therefore he underwent a CT of the chest without  contrast preoperatively.  The impression showed areas of scarring and  nodularity of the right lung with the largest measuring 6-9 mm, which  appeared to be partial calcified and was well-marginated.  His other nodules  appeared to be associated with pleural thickening and are all in the  subpleural location and appeared probable to be granulomata of the lung.  There was no evidence of enlarged lymph nodes.  A probable simple cyst less  than 1 cm was noted in the right lobe of the liver.   HOSPITAL COURSE:  Frank Weeks was electively admitted to [facility] on  March 18, 2006, and underwent coronary artery bypass graft surgery.  Intraoperatively there were no known complications, and he was transferred  postoperatively to the surgical intensive care unit, where he remained  through postoperative day #2.  At this point his hospital course has been  relatively uneventful.  He was extubated neurologically  intact by  postoperative day #1.  He did require some Neo-Synephrine and low-dose  dopamine initially but were both weaned prior to his transfer from the ICU.  He did require short-term diuretic therapy for mild fluid volume excess,  which was nearly resolved by discharge.  His postoperative labs remained  stable and without evidence of bleeding.  Chest tubes and invasive  monitoring lines were removed in routine fashion.  Again, by postoperative  day #3 he was on telemetry in 2000.  Anticipate he will remain there until  discharge.  He has remained hemodynamically stable with systolic blood  pressure primarily ranging in the mid-90s and diastolic around 60, but he  did have an isolated blood pressure of 137/90.  If it remains elevated, we  will resume his ACE inhibitor, but if his systolic blood pressure remains  below 100, we will restart this on an outpatient basis.  He has maintained  sinus rhythm with a heart rate around 80-90 in a sinus rhythm with  intermittent first degree heart block.  He has been afebrile and is  saturating 94% on room air.  He has been able to ambulate in the hallways  with hand-held assist and noted to have a steady gait.  His bowel and  bladder have been functioning appropriately.  On exam, his heart had a  regular rate and rhythm.  Lungs were clear.  His abdomen was mildly  distended but soft, nontender, with good bowel sounds.  He had a trace lower  extremity edema, and incisions were healing well without signs of infection.  He was tolerating a regular diet, and pain was controlled on oral  medication.  His postoperative chest x-ray showed a small right pleural  effusion and bibasilar atelectasis.   His postoperative labs showed a white blood count of 12.6, a rising  hemoglobin and hematocrit of 8.7 and 25.3, respectively, and platelet count  of 109.  A sodium of 138, potassium 4.1, chloride 102, CO2 28, BUN 14, creatinine 1.0, blood glucose 129.   Follow-up CBC and BMET have been ordered  but are pending at the time of this dictation.   If Frank Weeks continues to progress in this manner, it is anticipated that he  will be ready for discharge home on postoperative day #5, March 23, 2006.  External pacing wires and chest tube sutures will be removed prior to  discharge.   DISCHARGE MEDICATIONS:  1.  Aspirin 325 mg p.o. daily.  2.  Toprol  XL 25 mg p.o. daily.  3.  Crestor 10 mg p.o. daily.  4.  Zetia 10 mg p.o. daily.  5.  Tylox 1-2 tablets p.o. q.4h. p.r.n. pain.  6.  We will resume his Altace if it is felt his blood pressure can tolerate      this at discharge.   DISCHARGE INSTRUCTIONS:  He is instructed to avoid driving or heavy lifting  more than 10 pounds.  He was encouraged to continue doing walking and  breathing exercises.  He is to follow a low-salt, low-fat diet.  He may  shower and clean his incisions gently with soap and water.  He should notify  the [facility] office if he develops fever  greater than 101 or redness or drainage from his incision sites.  He is to  follow up with Frank Weeks at the [facility] office on May 02, 2006, at 1:50  p.m., and he is to be seen in Dr. Vern Claude office on April 03, 2006, at 2:30  p.m., with a chest x-ray.      Jerold Coombe, P.A.      Sheliah Plane, MD  Electronically Signed    AWZ/MEDQ  D:  03/22/2006  T:  03/23/2006  Job:  [id number]   cc:   Thomas C. Weeks, M.D.  [address]  [address]  [place]  [place] [address]   Redge Gainer. Perini, M.D.  Fax: [phone]

## 2011-03-02 NOTE — Discharge Summary (Signed)
NAME:  Frank Weeks, Frank Weeks                  ACCOUNT NO.:  [id number]   MEDICAL RECORD NO.:  [id number]          PATIENT TYPE:  INP   LOCATION:  3301                         FACILITY:  [facility]   PHYSICIAN:  Frank P. Pearlean Brownie, MD    DATE OF BIRTH:  08/04/1929   DATE OF ADMISSION:  09/15/2004  DATE OF DISCHARGE:  09/21/2004                                 DISCHARGE SUMMARY   DISCHARGE DIAGNOSES:  1.  Right hemisphere punctate infarctions involving the cortex and deep      white matter consistent with a watershed stroke.  2.  Right internal carotid artery stenosis, status post right carotid      endarterectomy September 20, 2004, by Dr. Quita Skye. Hart Weeks.  3.  Hyperlipidemia.  4.  Hypertension.  5.  History of subarachnoid hemorrhage.  6.  History of stroke in 1988.  7.  History of myocardial infarction in 1994.   DISCHARGE MEDICATIONS:  1.  Crestor 10 mg daily.  2.  Altace 10 mg every other day  3.  Aggrenox one p.o. daily x14 days then increase to b.i.d.   STUDIES PERFORMED:  1.  CT of the brain on admission showed no acute intracranial abnormalities,      old right cerebellar and frontal infarction with atrophy, mild left      mastoiditis.  2.  MRI of the brain shows relatively subtle areas of right hemispheric      punctate infarcts involving the cortex and deep white matter consistent      with a watershed distribution.  Atrophy and small vessel disease.      Chronic sinusitis  3.  MRA of the head is negative.  4.  MRA of the neck shows 95% stenosis proximal right ICA.  5.  Nuclear medicine study of the heart shows normal wall motion, EF 66% and      no evidence of ischemia.  6.  A 2-D echocardiogram  shows EF of 55 to 65% with no abnormal LV regional      wall motions.  No obvious embolic source.  7.  EKG shows normal sinus rhythm with first degree AV block, nonspecific ST      and T-wave abnormalities.  8.  Transcranial Doppler was not performed in [facility].   LABORATORY DATA:   Chemistry showed sodium 141, potassium 4.2, chloride 108,  CO2 26, glucose 106, BUN 8, creatinine 0.9, and calcium 7.8.  CBC with  hemoglobin 12.1, hematocrit 34.9, wbc 8.1, and platelets 180.  Hemoglobin  preoperatively was 13.0.  Homocystine 7.84, cholesterol 127, triglycerides  159, HDL 25 and LDL 70.  Hemoglobin A1C was not performed.   HISTORY OF PRESENT ILLNESS:  [name] is a 77 year old right-handed  white male who presented to [facility] then presented to  [facility]. [facility] for evaluation of right sixth nerve palsy  and abnormal MRI indicative of occipital lobe strokes on the right.  He has  a history of subarachnoid hemorrhage in 1963.  He did not require surgery at  that time but was told he  did have a brain aneurysm.  He states that the  aneurysm cured itself.  In 1988, he had a blocked artery in his neck and  presented with diaphoresis and dizziness.  He had bypass surgery by Dr.  Josephina Weeks stating his vertebral artery was hooked to his carotid.  In  1994,  he had an MI and fetal heart rate the last two years has had  hypertension and elevated cholesterol.   On the morning of admission, he developed a right orbital headache  associated with horizontal diplopia worse affecting to the right and  relieved by closing either eye.  This was associated with nausea and  vomiting but no chest pain, palpitation, syncope or seizure.  CT at [facility] showed evidence of old right frontal and right cerebellar  infarcts.  MRI of the brain showed an old right frontal, left frontal and  right cerebellar strokes and MRA showed no definite evidence of aneurysm.  There was an acute stroke in the right occipital and deep white matter  region mostly in the HCA and MCA distributions that were very small.  He did  have a right ICA stenosis.  He had been placed on aspirin and denied history  of drug abuse.   [facility] COURSE:  With small acute  infarcts and old infarcts in the  distribution of the right ICA it was decided the patient was a candidate for  surgery.  He had cardiac clearance with a nuclear medicine stress test which  showed no ischemia.  He had a right carotid performed on September 20, 2004,  which he tolerated very well.  He was neurologically normal prior to surgery  except for the right sixth nerve palsy which was improving.   Postoperatively he remained neurologically intact other than a mild right  sixth nerve palsy with nystagmus right eye when he looks to the right and  slight double vision.  He will be discharged on Aggrenox for secondary  stroke prevention.  Will follow up with Dr. Hart Weeks and Dr. Pearlean Weeks as an  outpatient.   DISCHARGE PLAN:  1.  Discharge home with family.  2.  Aggrenox for secondary stroke prevention.  3.  Needs strict control of his hypertension and lipids.  4.  Shower starting on Saturday.  5.  No driving or heavy lifting x2 weeks.  6.  Follow up with Dr. Hart Weeks on October 17, 2004, at 12:35; Dr. Pearlean Weeks in two      months.  7.  If diplopia persists, may need prisms.  8.  Consider  hemoglobin A1C.       SB/MEDQ  D:  09/21/2004  T:  09/21/2004  Job:  [id number]   cc:   Frank Weeks, M.D.  [address]  [place]  [place] [address]   Frank Weeks, M.D.  [address]  [place]  [place] [address]  Fax: [phone]   [facility]

## 2011-03-02 NOTE — Consult Note (Signed)
NAME:  Frank Weeks, Frank Weeks                  ACCOUNT NO.:  [id number]   MEDICAL RECORD NO.:  [id number]          PATIENT TYPE:  EMS   LOCATION:  ED                           FACILITY:  [facility]   PHYSICIAN:  Genene Churn. Love, M.D.    DATE OF BIRTH:  13-May-1929   DATE OF CONSULTATION:  09/15/2004  DATE OF DISCHARGE:                                   CONSULTATION   REASON FOR CONSULTATION:  This 77 year old right handed white married male  is seen in the emergency room at the request of Dr. Estell Harpin for evaluation of  headache and double vision.   HISTORY OF PRESENT ILLNESS:  Frank Weeks has a history of vascular disease with  risk factors. He has had a subarachnoid hemorrhage in 1963 and was evaluated  for an aneurysm. He states that he had an aneurysm but no surgery was  performed. He was seen by Dr. Vear Clock, a neurosurgeon in [place]. He did  well until at age 73, in 34, he developed difficulty with dizziness and  diaphoresis and was thought to have had a stroke. He underwent an extra-  cranial vascular procedure in which his vertebral artery was transposed to  enter the carotid artery on the left. He has had hypertension for 2 years  and had a myocardial infarction in 1994. Otherwise, he had been in good  health. This morning he awoke, noticing double vision with right periorbital  pain and had nausea and vomiting. There is no chest pain, palpitations,  focal arm or leg weakness, numbness, etc. He came to the emergency room and  was given morphine by Dr. Estell Harpin after a CT scan of the brain showed  evidence of right cerebellar and right frontal strokes, which were old. He  has no known history of diabetes mellitus and no history of head or neck  trauma.   PAST MEDICAL HISTORY:  Significant for [facility] in 1963, a stroke in 1988,  myocardial infarction in 1994, hypertension in the year 2003.   MEDICATIONS:  Altace 10 mg every other day, Crestor 10 mg q.d., aspirin 81  mg q.d.   ALLERGIES:  No known  drug allergies.   SOCIAL HISTORY:  He does not smoke cigarettes or drink alcohol.   PHYSICAL EXAMINATION:  GENERAL:  A well developed, pleasant, white male with  ichthyosis and flat feet.  VITAL SIGNS:  Blood pressure right and left arm 160/80. Heart rate of 64 and  regular.  NECK:  No carotid or supraclavicular bruits heard. Flexion and extension  maneuvers unremarkable.  NEUROLOGIC:  Alert and oriented x3. Following 1, 2, and 3 step commands.  Cranial nerves examination, visual fields full. Disks flat.  HEENT:  Extraocular movements showed a right 6th nerve palsy. Tongue  midline, uvula midline, gag was present. Slight left ptosis. Hearing intact.  Air conduction greater than bone conduction. Tongue midline. Uvula midline.  Gag was present.  EXTREMITIES:  Motor examination with good strength in the upper and lower  extremities. No evidence of drift. He had a diffuse outstretches hand and  arm tremor and no head  tremor. Sensory examination was intact. Deep tendon  reflexes 2+ and plantar responses forward and downgoing.   LABORATORY DATA:  Sodium 138, potassium 4.4, chloride 107, CO2 29, BUN 9,  creatinine 0.9, glucose 120. White blood cell count was 10,000. Hemoglobin  and hematocrit 14.8 and 42.9. Platelets were 227,000.   IMPRESSION:  Right 6th nerve palsy (Code 378.54).   PLAN:  In view of history of subarachnoid hemorrhage, obtain MRI study and  MRA of the brain.   Thank you.      JML/MEDQ  D:  09/15/2004  T:  09/16/2004  Job:  [id number]   cc:   Roosvelt Harps  [address] [place]  [place] [address]  Fax: [phone]   Bethann Berkshire, MD  [address] [place], [place] [address]

## 2011-03-02 NOTE — Assessment & Plan Note (Signed)
[facility]                                 ON-CALL NOTE   Frank Weeks, Frank Weeks                         MRN:          [id number]  DATE:12/26/2008                            DOB:          [birth year]    Cardiology surgical note.   PHONE NUMBER:  [phone].   CARDIOLOGIST:  [name], MD, [facility].   HISTORY:  Frank Weeks is a 77 year old male who recently saw Dr. Daleen Squibb  secondary to bradycardia, first degree AV block and second degree AV  block.  He has a LifeWatch monitor on.  The LifeWatch service contacted  me today due to some episodes of second degree AV block.  I got those  strips faxed to me at [facility] and it appears that he is  having Wenckebach block as well as some 2:1 block (I cannot rule out  second degree AV block type 2).  The monitoring service was able to get  in touch with Frank Weeks and I was told that he has been asymptomatic.  He  had another episode yesterday which was also asymptomatic.  I tried to  reach him at the phone number listed above but the number continues to  be busy.  I have left a message at the office for someone to contact the  patient tomorrow and to also have the strips reviewed by Dr. Daleen Squibb or one  of his partners in the office to decide if Frank Weeks needs earlier follow-  up than what is planned.      Tereso Newcomer, PA-C  Electronically Signed      Madolyn Frieze. Jens Som, MD, [facility]  Electronically Signed   SW/MedQ  DD: 12/26/2008  DT: 12/26/2008  Job #: [id number]

## 2011-05-11 ENCOUNTER — Encounter: Payer: Self-pay | Admitting: Internal Medicine

## 2011-06-12 ENCOUNTER — Encounter: Payer: Self-pay | Admitting: Internal Medicine

## 2011-06-12 ENCOUNTER — Ambulatory Visit (INDEPENDENT_AMBULATORY_CARE_PROVIDER_SITE_OTHER): Payer: Medicare Other | Admitting: Internal Medicine

## 2011-06-12 DIAGNOSIS — Z950 Presence of cardiac pacemaker: Secondary | ICD-10-CM

## 2011-06-12 DIAGNOSIS — I442 Atrioventricular block, complete: Secondary | ICD-10-CM

## 2011-06-12 DIAGNOSIS — I2510 Atherosclerotic heart disease of native coronary artery without angina pectoris: Secondary | ICD-10-CM

## 2011-06-12 DIAGNOSIS — I441 Atrioventricular block, second degree: Secondary | ICD-10-CM

## 2011-06-12 DIAGNOSIS — I4891 Unspecified atrial fibrillation: Secondary | ICD-10-CM

## 2011-06-12 DIAGNOSIS — R5383 Other fatigue: Secondary | ICD-10-CM | POA: Insufficient documentation

## 2011-06-12 DIAGNOSIS — R5381 Other malaise: Secondary | ICD-10-CM

## 2011-06-12 DIAGNOSIS — I440 Atrioventricular block, first degree: Secondary | ICD-10-CM

## 2011-06-12 LAB — PACEMAKER DEVICE OBSERVATION
AL IMPEDENCE PM: 412.5 Ohm
ATRIAL PACING PM: 20
BAMS-0001: 150 {beats}/min
BAMS-0003: 70 {beats}/min
DEVICE MODEL PM: 2317066
RV LEAD IMPEDENCE PM: 425 Ohm

## 2011-06-12 NOTE — Assessment & Plan Note (Signed)
As noted above he appreciated in improvement in his overall status when he ran out of his medications. We will undertake a three-week illumination trial of his antilipid therapy and antihypertensives. After that we will resume him incrementally.

## 2011-06-12 NOTE — Assessment & Plan Note (Signed)
The patient's device was interrogated.  The information was reviewed. No changes were made in the programming.    

## 2011-06-12 NOTE — Progress Notes (Signed)
  HPI  Frank Weeks is a 77 y.o. male Seen in followup for pacemaker implantation for second degree heart block and bradycardia.  He feels fatigued most days. There was a period of time number of weeks where he felt much better; this turned out to be coincidentally related to a time where he is medications had not yet been refilled and he was not taking them.  He also has coronary artery disease prior bypass surgery and intermittent angina.   Myoview in 2010 demonstrated EF:  56 %    Echocardiogram 2010 demonstrated normal left ventricular function;  He does have complaints of intermittent extreme fatigue as well as lightheadedness with bending over.  He has a history of lung cancer. I don't have the details of this. There is apparently some peribronchiolar involvement.     No past medical history on file.  No past surgical history on file.  Current Outpatient Prescriptions  Medication Sig Dispense Refill  . aspirin 325 MG tablet Take 325 mg by mouth daily.        . fish oil-omega-3 fatty acids 1000 MG capsule Take 2 g by mouth daily.        . nitroGLYCERIN (NITROSTAT) 0.4 MG SL tablet Place 0.4 mg under the tongue every 5 (five) minutes as needed.        . ramipril (ALTACE) 10 MG capsule Take 10 mg by mouth daily.        . rosuvastatin (CRESTOR) 10 MG tablet Take 10 mg by mouth. Take 1 tablet by mouth three times a week.       . travoprost, benzalkonium, (TRAVATAN) 0.004 % ophthalmic solution Place 1 drop into both eyes at bedtime.        . vitamin B-12 (CYANOCOBALAMIN) 100 MCG tablet Take 100 mcg by mouth daily.          Allergies  Allergen Reactions  . Atorvastatin     Review of Systems negative except from HPI and PMH  Physical Exam Well developed and well nourished in no acute distress HENT normal E scleral and icterus clear Neck Supple JVP flat; carotids brisk and full Clear to ausculation with decreased breath sounds on the left Regular rate and rhythm, no murmurs  gallops or rub Soft with active bowel sounds No clubbing cyanosis and edema Alert and oriented, grossly normal motor and sensory function Skin Warm and Dry     Assessment and  Plan

## 2011-06-12 NOTE — Patient Instructions (Signed)
Your physician has recommended you make the following change in your medication:  1) Decrease Aspirin to 81mg  one tablet daily. 2)HOLD- Altace (ramipril), Crestor, & Fish Oil for 3 weeks, then call and let Dr. Odessa Fleming nurse, Herbert Seta, know how you are feeling.  Your physician wants you to follow-up in: 6 months w/ Kristin/Paula & 1 year w/ Dr. Graciela Husbands. You will receive a reminder letter in the mail two months in advance. If you don't receive a letter, please call our office to schedule the follow-up appointment.

## 2011-06-12 NOTE — Assessment & Plan Note (Signed)
stable °

## 2011-06-12 NOTE — Assessment & Plan Note (Signed)
This is been progressive. He is now device dependence.

## 2011-06-12 NOTE — Assessment & Plan Note (Signed)
He is having 10-20% of the time atrial fibrillation. He is not on oral anticoagulation. I don't know whether this is related to his prior history of subarachnoid hemorrhage or not. We will reduce his aspirin and will discuss alternatives later

## 2011-07-02 ENCOUNTER — Telehealth: Payer: Self-pay | Admitting: Internal Medicine

## 2011-07-02 NOTE — Telephone Encounter (Signed)
I spoke with the patient. He has been off ramipril, crestor, and fish oil for about 3 weeks due to fatigue. He states today that he has noticed no change in his symptoms since being off these. I have explained to him that since he is noticing no improvement off meds, that they should not be contributing to his symptoms. I have advised that he resume his medications. He states that he has had some elevated BP readings of 166/89 & 154/92. Otherwise, the readings he gave me were 133/76 & 133/84. I explained that the elevated readings are probably from him being off altace. I made him aware I would forward this message to Dr. Graciela Husbands to review and would call him back if there were any recommendations. He voices understanding.

## 2011-07-02 NOTE — Telephone Encounter (Signed)
Pt calling in today as instructed per pt orders. Please return pt call to discuss further.

## 2011-08-03 ENCOUNTER — Other Ambulatory Visit: Payer: Self-pay | Admitting: Cardiology

## 2011-12-14 ENCOUNTER — Encounter: Payer: Self-pay | Admitting: *Deleted

## 2012-01-03 ENCOUNTER — Encounter: Payer: Self-pay | Admitting: Internal Medicine

## 2012-01-03 ENCOUNTER — Ambulatory Visit (INDEPENDENT_AMBULATORY_CARE_PROVIDER_SITE_OTHER): Payer: Medicare Other | Admitting: *Deleted

## 2012-01-03 DIAGNOSIS — I442 Atrioventricular block, complete: Secondary | ICD-10-CM

## 2012-01-03 DIAGNOSIS — I4891 Unspecified atrial fibrillation: Secondary | ICD-10-CM

## 2012-01-03 LAB — PACEMAKER DEVICE OBSERVATION
AL IMPEDENCE PM: 450 Ohm
AL THRESHOLD: 1 V
BAMS-0003: 70 {beats}/min
DEVICE MODEL PM: 2317066
RV LEAD AMPLITUDE: 10.8 mv
RV LEAD THRESHOLD: 0.625 V

## 2012-01-03 NOTE — Progress Notes (Signed)
Pacer check in clinic  

## 2012-01-07 ENCOUNTER — Telehealth: Payer: Self-pay | Admitting: Internal Medicine

## 2012-01-07 NOTE — Telephone Encounter (Signed)
I spoke with Judeth Cornfield at the chiropractor's office. She states the patient is needing a laser treatment that is essentially a "heat" treatment to the right shoulder. This helps to break the muscle down. Per Judeth Cornfield, the doctor performing the procedure already contacted the laser company due to the patient's pacemaker and the company felt there would be no issue since the treatment is on the opposite side of the device. I explained to Judeth Cornfield I would relay this to Dr. Graciela Husbands and call her back with his recommendations.

## 2012-01-07 NOTE — Telephone Encounter (Signed)
New problem:  Per Judeth Cornfield calling to see if patient can have laser treatment- on right shoulder . H/o pacer maker.

## 2012-01-08 NOTE — Telephone Encounter (Signed)
Judeth Cornfield called today from the Dr. Mayford Knife office (chiropractor) to inquire about the message from yesterday. I explained to [place] I thought Dr. Graciela Husbands had looked at the message, but that he had not responded as of yet. I advised he has unexpectedly had to go out of town and that it may be next week before I can get back with her. She will inform Dr. Mayford Knife.

## 2012-01-14 NOTE — Telephone Encounter (Signed)
Yes that is my understanding also that it being on the opposite side makes it not an issue

## 2012-01-15 ENCOUNTER — Encounter: Payer: Self-pay | Admitting: *Deleted

## 2012-01-15 NOTE — Telephone Encounter (Signed)
Ok faxed to Dr. Mayford Knife. I called and spoke with Shanda Bumps and made her aware to check the fax machine.

## 2012-03-27 ENCOUNTER — Telehealth: Payer: Self-pay | Admitting: Internal Medicine

## 2012-03-27 NOTE — Telephone Encounter (Signed)
I spoke with the patient. He states that for about 2-3 weeks he has felt "puny" and off balance. He is not sure is this is related to his device. He has not passed out, but has fallen due to his balance issues. I offered him an appointment for today for a device check. He declined this. He will be seen in [place] office tomorrow with Belenda Cruise. He is agreeable with this.

## 2012-03-27 NOTE — Telephone Encounter (Signed)
Fu call °Pt's wife called back again °

## 2012-03-27 NOTE — Telephone Encounter (Signed)
New Problem:    Patient's wife called in because he is feeling like he did when his device was malfunctioning about a year and a half ago.  Patient would like to be seen this morning today.  Please call back.

## 2012-03-28 ENCOUNTER — Encounter: Payer: Self-pay | Admitting: Internal Medicine

## 2012-03-28 ENCOUNTER — Ambulatory Visit (INDEPENDENT_AMBULATORY_CARE_PROVIDER_SITE_OTHER): Payer: Medicare Other | Admitting: *Deleted

## 2012-03-28 DIAGNOSIS — I442 Atrioventricular block, complete: Secondary | ICD-10-CM

## 2012-03-28 DIAGNOSIS — I4891 Unspecified atrial fibrillation: Secondary | ICD-10-CM

## 2012-03-28 LAB — PACEMAKER DEVICE OBSERVATION
AL IMPEDENCE PM: 430 Ohm
ATRIAL PACING PM: 6.2
BAMS-0003: 70 {beats}/min
BATTERY VOLTAGE: 2.95 V

## 2012-03-28 NOTE — Progress Notes (Signed)
Pacer check in clinic  

## 2012-06-10 ENCOUNTER — Encounter: Payer: Self-pay | Admitting: *Deleted

## 2012-06-17 ENCOUNTER — Ambulatory Visit (INDEPENDENT_AMBULATORY_CARE_PROVIDER_SITE_OTHER): Payer: Medicare Other | Admitting: Internal Medicine

## 2012-06-17 ENCOUNTER — Encounter: Payer: Self-pay | Admitting: Internal Medicine

## 2012-06-17 VITALS — BP 119/76 | HR 67 | Ht 69.0 in | Wt 178.4 lb

## 2012-06-17 DIAGNOSIS — R5383 Other fatigue: Secondary | ICD-10-CM

## 2012-06-17 DIAGNOSIS — Z950 Presence of cardiac pacemaker: Secondary | ICD-10-CM

## 2012-06-17 DIAGNOSIS — I4891 Unspecified atrial fibrillation: Secondary | ICD-10-CM

## 2012-06-17 DIAGNOSIS — I609 Nontraumatic subarachnoid hemorrhage, unspecified: Secondary | ICD-10-CM

## 2012-06-17 DIAGNOSIS — W19XXXA Unspecified fall, initial encounter: Secondary | ICD-10-CM

## 2012-06-17 DIAGNOSIS — R5381 Other malaise: Secondary | ICD-10-CM

## 2012-06-17 DIAGNOSIS — I442 Atrioventricular block, complete: Secondary | ICD-10-CM

## 2012-06-17 DIAGNOSIS — I25810 Atherosclerosis of coronary artery bypass graft(s) without angina pectoris: Secondary | ICD-10-CM

## 2012-06-17 LAB — PACEMAKER DEVICE OBSERVATION
AL THRESHOLD: 1 V
BAMS-0001: 150 {beats}/min
BAMS-0003: 70 {beats}/min
RV LEAD AMPLITUDE: 12 mv
RV LEAD THRESHOLD: 0.625 V

## 2012-06-17 NOTE — Assessment & Plan Note (Signed)
Stable post pacing 

## 2012-06-17 NOTE — Assessment & Plan Note (Signed)
The patient's device was interrogated.  The information was reviewed. No changes were made in the programming.    

## 2012-06-17 NOTE — Assessment & Plan Note (Signed)
He has had multiple falls. He has evidence of cerebellar instability. I wonder whether neurological/balance rehabilitation consultation might be helpful. There is also a report I read a couple of years ago about using the Wii Fit for balance.  I last him to follow up with his PCP about this

## 2012-06-17 NOTE — Assessment & Plan Note (Signed)
38% burden; not a candidate for anticoagulation apparently although with the advent of apixoban I. He may well be a candidate. We have to try and figure out his following problems firstlem first

## 2012-06-17 NOTE — Progress Notes (Signed)
Patient Care Team: Ezequiel Kayser, MD as PCP - General   HPI  Frank Weeks is a 78 y.o. male Seen in followup for pacemaker implantation for second degree heart block and bradycardia.    He also has coronary artery disease prior bypass surgery and intermittent angina. Myoview in 2010 demonstrated EF: 56 %  Echocardiogram 2010 demonstrated normal left ventricular function;  He has a history of lung cancer. I don't have the details of this. There is apparently some peribronchiolar involvement.    his major complaints are fatigue and sleepiness  is also problems with balance and in his mind this is worse since his cataract surgery. He has a number of falls    Past Medical History  Diagnosis Date  . Coronary artery disease     Prior CABG  . Chest pain, non-cardiac     Negative Myoview  . Pacemaker     Second degree heart block and bradycardia  . Ischemic heart disease   . PVD (peripheral vascular disease)   . HTN (hypertension)   . Dyslipidemia   . Subarachnoid hemorrhage   . Cerebrovascular disease   . GERD (gastroesophageal reflux disease)   . Second degree AV block, Mobitz type II   . Heart block AV first degree   . SOB (shortness of breath)   . Stroke   . Glaucoma   . Pleural effusion     Past Surgical History  Procedure Date  . Coronary artery bypass graft 03/18/2006  . Right carotid endarectomy 09/2004  . Left carotid to vertebral bypass   . Dual-chamber pacemaker implantation   . Thoracentesis and bronchoscopy     Current Outpatient Prescriptions  Medication Sig Dispense Refill  . aspirin EC 81 MG tablet Take 1 tablet (81 mg total) by mouth daily.      Marland Kitchen NITROSTAT 0.4 MG SL tablet DISSOLVE 1 TABLET UNDER TONGUE EVERY 5 MINUTES AS NEEDED FOR CHESTPAIN - MAY REPEAT 3 TIMES  25 each  7  . ramipril (ALTACE) 10 MG capsule Take 10 mg by mouth daily.        . rosuvastatin (CRESTOR) 10 MG tablet Take 10 mg by mouth. Take 1 tablet by mouth three times a week.       .  travoprost, benzalkonium, (TRAVATAN) 0.004 % ophthalmic solution Place 1 drop into both eyes at bedtime.        . vitamin B-12 (CYANOCOBALAMIN) 100 MCG tablet Take 100 mcg by mouth daily.        . fish oil-omega-3 fatty acids 1000 MG capsule Take 2 g by mouth daily.          Allergies  Allergen Reactions  . Atorvastatin     Review of Systems negative except from HPI and PMH  Physical Exam BP 119/76  Pulse 67  Ht 5\' 9"  (1.753 m)  Wt 178 lb 6.4 oz (80.922 kg)  BMI 26.35 kg/m2 Well developed and well nourished in no acute distress HENT normal E scleral and icterus clear Neck Supple JVP flat; carotids brisk and full Clear to ausculation Regular rate and rhythm,  2/6 systolic murmur and an S4  Soft with active bowel sounds No clubbing cyanosis none Edema Alert and oriented, grossly normal motor and sensory function; he has lateral nystagmus bilaterally and a wide-based gait. He is unable to walk heel-to-toe his Romberg is negative  Skin Warm and Dry    Assessment and  Plan

## 2012-06-17 NOTE — Assessment & Plan Note (Signed)
I wonder whether this is related to sleep apnea. He has significant daytime somnolence. I've asked that he followup with his PCP consider a sleep study.

## 2012-06-17 NOTE — Assessment & Plan Note (Signed)
Precluding anticoagulation; he continues to fall

## 2012-06-17 NOTE — Assessment & Plan Note (Signed)
Currently without angina. Continue current medications

## 2012-06-17 NOTE — Patient Instructions (Addendum)
Your physician wants you to follow-up in: 6 months with device clinic and 1 year with Dr. Klein. You will receive a reminder letter in the mail two months in advance. If you don't receive a letter, please call our office to schedule the follow-up appointment.  

## 2012-12-04 ENCOUNTER — Other Ambulatory Visit: Payer: Self-pay | Admitting: Cardiology

## 2012-12-31 ENCOUNTER — Encounter: Payer: Self-pay | Admitting: *Deleted

## 2013-01-05 ENCOUNTER — Ambulatory Visit (INDEPENDENT_AMBULATORY_CARE_PROVIDER_SITE_OTHER): Payer: Medicare Other | Admitting: *Deleted

## 2013-01-05 ENCOUNTER — Encounter: Payer: Self-pay | Admitting: Internal Medicine

## 2013-01-05 ENCOUNTER — Telehealth: Payer: Self-pay | Admitting: Internal Medicine

## 2013-01-05 ENCOUNTER — Other Ambulatory Visit: Payer: Self-pay

## 2013-01-05 DIAGNOSIS — I442 Atrioventricular block, complete: Secondary | ICD-10-CM

## 2013-01-05 DIAGNOSIS — I4891 Unspecified atrial fibrillation: Secondary | ICD-10-CM

## 2013-01-05 LAB — PACEMAKER DEVICE OBSERVATION
AL AMPLITUDE: 5 mv
AL THRESHOLD: 0.75 V
BAMS-0001: 150 {beats}/min
DEVICE MODEL PM: 2317066
RV LEAD AMPLITUDE: 11.7 mv
RV LEAD IMPEDENCE PM: 487.5 Ohm
RV LEAD THRESHOLD: 0.625 V

## 2013-01-05 NOTE — Telephone Encounter (Signed)
New problem   Pt will be in [place] today and wants to know if he can come in today to have his pace maker checked--he will be avail at his home # [phone] until 9:30 this morning

## 2013-01-05 NOTE — Progress Notes (Signed)
Pacer check in clinic  

## 2013-02-13 ENCOUNTER — Telehealth: Payer: Self-pay | Admitting: Internal Medicine

## 2013-02-13 NOTE — Telephone Encounter (Signed)
New problem    C/O chest discomfort  When he walks.   Weak.

## 2013-02-13 NOTE — Telephone Encounter (Signed)
Spoke with pt wife, for the last couple weeks the pt has c/o weakness with exertion. She reports he is not really having pain just weakness. She has not called before now because the pt would not let her but he has gradually gotten weaker. She requested an appt Tuesday or Friday. No available appt those days so he will come on Wednesday to see scott weaver pac. The pt wife reports he will not go to the ER. She reports this is how he has felt before when iot was his heart. Wife voiced understanding to take the pt to the ER if his symptoms become worse or change.

## 2013-02-13 NOTE — Telephone Encounter (Signed)
Unable to reach pt or leave a message  

## 2013-02-13 NOTE — Telephone Encounter (Signed)
Left message for pt to call.

## 2013-02-16 ENCOUNTER — Telehealth: Payer: Self-pay | Admitting: Internal Medicine

## 2013-02-16 NOTE — Telephone Encounter (Deleted)
error 

## 2013-02-18 ENCOUNTER — Ambulatory Visit: Payer: Medicare Other | Admitting: Physician Assistant

## 2013-02-23 NOTE — Telephone Encounter (Signed)
Spoke with pt wife, the pts symptoms have not changed. He cont to have weakness and is doing less and less. The pt does not want to see the pa. He requested to see dr hochrein when he is in [facility] Wednesday. Explained his schedule is full, they would like to be worked in. Will work on the schedule and call the wife back.

## 2013-02-23 NOTE — Telephone Encounter (Signed)
New problem    C/o more tightness in chest

## 2013-02-24 NOTE — Telephone Encounter (Signed)
Spoke with wife, appt with dr hochrein wed in [place].

## 2013-02-25 ENCOUNTER — Encounter: Payer: Self-pay | Admitting: Cardiology

## 2013-02-25 ENCOUNTER — Ambulatory Visit (INDEPENDENT_AMBULATORY_CARE_PROVIDER_SITE_OTHER): Payer: Medicare Other | Admitting: Cardiology

## 2013-02-25 VITALS — BP 134/76 | HR 70 | Ht 68.0 in | Wt 165.0 lb

## 2013-02-25 DIAGNOSIS — R079 Chest pain, unspecified: Secondary | ICD-10-CM

## 2013-02-25 DIAGNOSIS — I6523 Occlusion and stenosis of bilateral carotid arteries: Secondary | ICD-10-CM

## 2013-02-25 DIAGNOSIS — I658 Occlusion and stenosis of other precerebral arteries: Secondary | ICD-10-CM

## 2013-02-25 DIAGNOSIS — I25810 Atherosclerosis of coronary artery bypass graft(s) without angina pectoris: Secondary | ICD-10-CM

## 2013-02-25 DIAGNOSIS — I4891 Unspecified atrial fibrillation: Secondary | ICD-10-CM

## 2013-02-25 DIAGNOSIS — I5031 Acute diastolic (congestive) heart failure: Secondary | ICD-10-CM

## 2013-02-25 NOTE — Progress Notes (Signed)
Frank The patient presents for his first visit with me. He previously saw Dr. Daleen Squibb and follows with Dr. Graciela Husbands for device followup. He has a history of coronary artery disease and paroxysmal atrial fibrillation. He has not been treated with anticoagulation because of difficulty gait, balance and falls.  The etiology of this is not clear.  He presents today with complains predominantly of fatigue. He does get tired when he walks up an incline which is been progressive. He might also get a little chest tightness. He has a feeling of weakness it is particularly severe when he squats or bends down. He has a discomfort in his chest with this. He was worried about previous scar tissue in his lungs or possibly symptoms related to his pacemaker. He doesn't really describe palpitations. He does have lightheadedness but no presyncope or syncope. He doesn't really describe shortness of breath, PND or orthopnea. He has had no weight gain or edema.  Allergies  Allergen Reactions  . Atorvastatin     Current Outpatient Prescriptions  Medication Sig Dispense Refill  . aspirin EC 81 MG tablet Take 1 tablet (81 mg total) by mouth daily.      Marland Kitchen co-enzyme Q-10 30 MG capsule Take 30 mg by mouth 2 (two) times daily.      . fish oil-omega-3 fatty acids 1000 MG capsule Take 2 g by mouth daily.        Marland Kitchen NITROSTAT 0.4 MG SL tablet DISSOLVE 1 TABLET UNDER TONGUE EVERY 5 MINUTES AS NEEDED FOR CHESTPAIN - MAY REPEAT 3 TIMES  25 tablet  6  . ramipril (ALTACE) 10 MG capsule Take 10 mg by mouth daily.        . rosuvastatin (CRESTOR) 10 MG tablet Take 10 mg by mouth. Take 1 tablet by mouth three times a week.       . travoprost, benzalkonium, (TRAVATAN) 0.004 % ophthalmic solution Place 1 drop into both eyes at bedtime.         No current facility-administered medications for this visit.    Past Medical History  Diagnosis Date  . Coronary artery disease     Prior CABG  . Chest pain, non-cardiac     Negative Myoview  .  Pacemaker     Second degree heart block and bradycardia  . Ischemic heart disease   . PVD (peripheral vascular disease)   . HTN (hypertension)   . Dyslipidemia   . Subarachnoid hemorrhage   . Cerebrovascular disease   . GERD (gastroesophageal reflux disease)   . Second degree AV block, Mobitz type II   . Heart block AV first degree   . SOB (shortness of breath)   . Stroke   . Glaucoma(365)   . Pleural effusion     Past Surgical History  Procedure Laterality Date  . Coronary artery bypass graft  03/18/2006  . Right carotid endarectomy  09/2004  . Left carotid to vertebral bypass    . Dual-chamber pacemaker implantation    . Thoracentesis and bronchoscopy      ROS:  As stated in the Frank and negative for all other systems.  PHYSICAL EXAM BP 134/76  Pulse 70  Ht 5\' 8"  (1.727 m)  Wt 165 lb (74.844 kg)  BMI 25.09 kg/m2 GENERAL:  Well appearing HEENT:  Pupils equal round and reactive, fundi not visualized, oral mucosa unremarkable NECK:  No jugular venous distention, waveform within normal limits, carotid upstroke brisk and symmetric,biilateral carotid bruits, no thyromegaly LYMPHATICS:  No cervical,  inguinal adenopathy LUNGS:  Clear to auscultation bilaterally BACK:  No CVA tenderness CHEST:  Well healed sternotomy scar. HEART:  PMI not displaced or sustained,S1 and S2 within normal limits, no S3, no S4, no clicks, no rubs, apical systolic early peaking 2/6 murmur reading out the aortic outflow tract, no diastolicmurmurs ABD:  Flat, positive bowel sounds normal in frequency in pitch, no bruits, no rebound, no guarding, no midline pulsatile mass, no hepatomegaly, no splenomegaly EXT:  2 plus pulses throughout, no edema, no cyanosis no clubbing SKIN:  Slight rash in the upper chest, no nodules NEURO:  Cranial nerves II through XII grossly intact, motor grossly intact throughout PSYCH:  Cognitively intact, oriented to person place and time   EKG: normal sinus rhythm, rate 74,  left bundle branch block, no change from previous. 02/25/2013   ASSESSMENT AND PLAN  CAD:  Given his complaints of chest discomfort and fatigue with exertion he needs to be screened with a stress test. He would not be a walk on a treadmill so she will have a [employer].  ATRIAL FIBRILLATION:  He has paroxysms of this for interrogation of his device previously but no significant symptoms necessarily associated with this. I may have to have him wear an event monitor in the future if he continues to have episodic weakness.  AORTIC STENOSIS:  This was mild on a distant echo. I will followup with an echocardiogram.  CAROTID STENOSIS:  He is overdue for followup with carotid Dopplers.  FATIGUE:  I suspect this is probably multifactorial but he will be evaluated as above. I will defer the workup to Ezequiel Kayser, MD

## 2013-02-25 NOTE — Patient Instructions (Addendum)
The current medical regimen is effective;  continue present plan and medications.  Your physician has requested that you have a lexiscan myoview. For further information please visit [email or web address] Please follow instruction sheet, as given.  Your physician has requested that you have an echocardiogram. Echocardiography is a painless test that uses sound waves to create images of your heart. It provides your doctor with information about the size and shape of your heart and how well your heart's chambers and valves are working. This procedure takes approximately one hour. There are no restrictions for this procedure.  Your physician has requested that you have a carotid duplex. This test is an ultrasound of the carotid arteries in your neck. It looks at blood flow through these arteries that supply the brain with blood. Allow one hour for this exam. There are no restrictions or special instructions.  Follow up with Dr Antoine Poche in 2 months in [place].

## 2013-03-08 ENCOUNTER — Inpatient Hospital Stay (HOSPITAL_COMMUNITY): Payer: Medicare Other

## 2013-03-08 ENCOUNTER — Inpatient Hospital Stay (HOSPITAL_COMMUNITY)
Admission: EM | Admit: 2013-03-08 | Discharge: 2013-03-13 | DRG: 065 | Disposition: A | Discharge status: Rehabilitation Facility | Payer: Medicare Other | Attending: Internal Medicine | Admitting: Internal Medicine

## 2013-03-08 ENCOUNTER — Emergency Department (HOSPITAL_COMMUNITY): Payer: Medicare Other

## 2013-03-08 ENCOUNTER — Encounter (HOSPITAL_COMMUNITY): Payer: Self-pay

## 2013-03-08 DIAGNOSIS — E785 Hyperlipidemia, unspecified: Secondary | ICD-10-CM | POA: Diagnosis present

## 2013-03-08 DIAGNOSIS — I4891 Unspecified atrial fibrillation: Secondary | ICD-10-CM | POA: Diagnosis present

## 2013-03-08 DIAGNOSIS — I442 Atrioventricular block, complete: Secondary | ICD-10-CM | POA: Diagnosis present

## 2013-03-08 DIAGNOSIS — H409 Unspecified glaucoma: Secondary | ICD-10-CM | POA: Diagnosis present

## 2013-03-08 DIAGNOSIS — I679 Cerebrovascular disease, unspecified: Secondary | ICD-10-CM

## 2013-03-08 DIAGNOSIS — I609 Nontraumatic subarachnoid hemorrhage, unspecified: Secondary | ICD-10-CM

## 2013-03-08 DIAGNOSIS — K219 Gastro-esophageal reflux disease without esophagitis: Secondary | ICD-10-CM | POA: Diagnosis present

## 2013-03-08 DIAGNOSIS — K589 Irritable bowel syndrome without diarrhea: Secondary | ICD-10-CM | POA: Diagnosis present

## 2013-03-08 DIAGNOSIS — I6340 Cerebral infarction due to embolism of unspecified cerebral artery: Principal | ICD-10-CM | POA: Diagnosis present

## 2013-03-08 DIAGNOSIS — I4892 Unspecified atrial flutter: Secondary | ICD-10-CM

## 2013-03-08 DIAGNOSIS — I639 Cerebral infarction, unspecified: Secondary | ICD-10-CM

## 2013-03-08 DIAGNOSIS — G8190 Hemiplegia, unspecified affecting unspecified side: Secondary | ICD-10-CM | POA: Diagnosis present

## 2013-03-08 DIAGNOSIS — R29810 Facial weakness: Secondary | ICD-10-CM | POA: Diagnosis present

## 2013-03-08 DIAGNOSIS — Z951 Presence of aortocoronary bypass graft: Secondary | ICD-10-CM

## 2013-03-08 DIAGNOSIS — I10 Essential (primary) hypertension: Secondary | ICD-10-CM | POA: Diagnosis present

## 2013-03-08 DIAGNOSIS — B86 Scabies: Secondary | ICD-10-CM | POA: Diagnosis present

## 2013-03-08 DIAGNOSIS — R471 Dysarthria and anarthria: Secondary | ICD-10-CM | POA: Diagnosis present

## 2013-03-08 DIAGNOSIS — I739 Peripheral vascular disease, unspecified: Secondary | ICD-10-CM | POA: Diagnosis present

## 2013-03-08 DIAGNOSIS — I25810 Atherosclerosis of coronary artery bypass graft(s) without angina pectoris: Secondary | ICD-10-CM

## 2013-03-08 DIAGNOSIS — Z66 Do not resuscitate: Secondary | ICD-10-CM | POA: Diagnosis not present

## 2013-03-08 DIAGNOSIS — Z950 Presence of cardiac pacemaker: Secondary | ICD-10-CM

## 2013-03-08 DIAGNOSIS — Z9181 History of falling: Secondary | ICD-10-CM

## 2013-03-08 DIAGNOSIS — G8100 Flaccid hemiplegia affecting unspecified side: Secondary | ICD-10-CM | POA: Diagnosis present

## 2013-03-08 DIAGNOSIS — I2510 Atherosclerotic heart disease of native coronary artery without angina pectoris: Secondary | ICD-10-CM | POA: Diagnosis present

## 2013-03-08 DIAGNOSIS — B370 Candidal stomatitis: Secondary | ICD-10-CM | POA: Diagnosis present

## 2013-03-08 DIAGNOSIS — K5900 Constipation, unspecified: Secondary | ICD-10-CM | POA: Diagnosis not present

## 2013-03-08 DIAGNOSIS — Z87891 Personal history of nicotine dependence: Secondary | ICD-10-CM

## 2013-03-08 DIAGNOSIS — R1312 Dysphagia, oropharyngeal phase: Secondary | ICD-10-CM | POA: Diagnosis present

## 2013-03-08 DIAGNOSIS — R4701 Aphasia: Secondary | ICD-10-CM | POA: Diagnosis present

## 2013-03-08 HISTORY — DX: Paroxysmal atrial fibrillation: I48.0

## 2013-03-08 HISTORY — DX: Bradycardia, unspecified: R00.1

## 2013-03-08 LAB — COMPREHENSIVE METABOLIC PANEL
BUN: 15 mg/dL (ref 6–23)
CO2: 25 mEq/L (ref 19–32)
Calcium: 9.5 mg/dL (ref 8.4–10.5)
Chloride: 102 mEq/L (ref 96–112)
Creatinine, Ser: 0.96 mg/dL (ref 0.50–1.35)
GFR calc Af Amer: 87 mL/min — ABNORMAL LOW (ref >90)
GFR calc non Af Amer: 75 mL/min — ABNORMAL LOW (ref >90)
Glucose, Bld: 100 mg/dL — ABNORMAL HIGH (ref 70–99)
Total Bilirubin: 0.4 mg/dL (ref 0.3–1.2)

## 2013-03-08 LAB — MRSA PCR SCREENING: MRSA by PCR: NEGATIVE

## 2013-03-08 LAB — DIFFERENTIAL
Eosinophils Relative: 3 % (ref 0–5)
Lymphocytes Relative: 14 % (ref 12–46)
Lymphs Abs: 1.3 10*3/uL (ref 0.7–4.0)
Monocytes Absolute: 1 10*3/uL (ref 0.1–1.0)
Monocytes Relative: 10 % (ref 3–12)
Neutro Abs: 7.2 10*3/uL (ref 1.7–7.7)

## 2013-03-08 LAB — CBC
HCT: 41.6 % (ref 39.0–52.0)
Hemoglobin: 14.1 g/dL (ref 13.0–17.0)
MCV: 96.7 fL (ref 78.0–100.0)
RBC: 4.3 MIL/uL (ref 4.22–5.81)
RDW: 13.4 % (ref 11.5–15.5)
WBC: 9.9 10*3/uL (ref 4.0–10.5)

## 2013-03-08 LAB — POCT I-STAT, CHEM 8
Creatinine, Ser: 0.9 mg/dL (ref 0.50–1.35)
HCT: 44 % (ref 39.0–52.0)
Hemoglobin: 15 g/dL (ref 13.0–17.0)
Potassium: 4.5 mEq/L (ref 3.5–5.1)
Sodium: 142 mEq/L (ref 135–145)
TCO2: 29 mmol/L (ref 0–100)

## 2013-03-08 LAB — ETHANOL: Alcohol, Ethyl (B): <11 mg/dL (ref 0–11)

## 2013-03-08 LAB — POCT I-STAT TROPONIN I

## 2013-03-08 MED ORDER — ENOXAPARIN SODIUM 40 MG/0.4ML ~~LOC~~ SOLN
40.0000 mg | SUBCUTANEOUS | Status: DC
  Administered 2013-03-08 – 2013-03-09 (×2): 40 mg via SUBCUTANEOUS
  Filled 2013-03-08 (×3): qty 0.4

## 2013-03-08 MED ORDER — ONDANSETRON HCL 4 MG/2ML IJ SOLN: 4.0000 mg | Freq: Four times a day (QID) | INTRAMUSCULAR | Status: DC | PRN

## 2013-03-08 MED ORDER — ROSUVASTATIN CALCIUM 10 MG PO TABS
10.0000 mg | ORAL_TABLET | Freq: Every day | ORAL | Status: DC
  Administered 2013-03-09 – 2013-03-12 (×4): 10 mg via ORAL
  Filled 2013-03-08 (×6): qty 1

## 2013-03-08 MED ORDER — TRAVOPROST (BAK FREE) 0.004 % OP SOLN
1.0000 [drp] | Freq: Every day | OPHTHALMIC | Status: DC
  Administered 2013-03-08 – 2013-03-12 (×4): 1 [drp] via OPHTHALMIC
  Filled 2013-03-08 (×2): qty 2.5

## 2013-03-08 MED ORDER — SODIUM CHLORIDE 0.9 % IV SOLN: INTRAVENOUS | Status: DC

## 2013-03-08 MED ORDER — SODIUM CHLORIDE 0.9 % IV SOLN
INTRAVENOUS | Status: DC
  Administered 2013-03-08 – 2013-03-09 (×2): via INTRAVENOUS
  Administered 2013-03-09: 1000 mL via INTRAVENOUS
  Administered 2013-03-11 – 2013-03-13 (×3): via INTRAVENOUS

## 2013-03-08 MED ORDER — ACETAMINOPHEN 325 MG PO TABS: 650.0000 mg | ORAL_TABLET | ORAL | Status: DC | PRN

## 2013-03-08 MED ORDER — SENNOSIDES-DOCUSATE SODIUM 8.6-50 MG PO TABS: 1.0000 | ORAL_TABLET | Freq: Every evening | ORAL | Status: DC | PRN

## 2013-03-08 MED ORDER — ASPIRIN 300 MG RE SUPP: 300.0000 mg | Freq: Every day | RECTAL | Status: DC

## 2013-03-08 MED ORDER — BIOTENE DRY MOUTH MT LIQD
15.0000 mL | Freq: Two times a day (BID) | OROMUCOSAL | Status: DC
  Administered 2013-03-09 – 2013-03-13 (×9): 15 mL via OROMUCOSAL

## 2013-03-08 MED ORDER — PERMETHRIN 0.25 % LIQD: Freq: Once | Status: DC

## 2013-03-08 MED ORDER — LABETALOL HCL 5 MG/ML IV SOLN: 10.0000 mg | INTRAVENOUS | Status: DC | PRN

## 2013-03-08 MED ORDER — SENNOSIDES-DOCUSATE SODIUM 8.6-50 MG PO TABS
1.0000 | ORAL_TABLET | Freq: Every evening | ORAL | Status: DC | PRN
  Filled 2013-03-08: qty 1

## 2013-03-08 MED ORDER — PANTOPRAZOLE SODIUM 40 MG IV SOLR: 40.0000 mg | Freq: Every day | INTRAVENOUS | Status: DC

## 2013-03-08 MED ORDER — ATORVASTATIN CALCIUM 20 MG PO TABS: 20.0000 mg | ORAL_TABLET | Freq: Every day | ORAL | Status: DC

## 2013-03-08 MED ORDER — ALTEPLASE (STROKE) FULL DOSE INFUSION
67.0000 mg | INTRAVENOUS | Status: DC
  Filled 2013-03-08: qty 67

## 2013-03-08 MED ORDER — TRAVOPROST 0.004 % OP SOLN: 1.0000 [drp] | Freq: Every day | OPHTHALMIC | Status: DC

## 2013-03-08 MED ORDER — CHLORHEXIDINE GLUCONATE 0.12 % MT SOLN
15.0000 mL | Freq: Two times a day (BID) | OROMUCOSAL | Status: DC
  Administered 2013-03-08 – 2013-03-13 (×10): 15 mL via OROMUCOSAL
  Filled 2013-03-08 (×13): qty 15

## 2013-03-08 MED ORDER — ACETAMINOPHEN 650 MG RE SUPP: 650.0000 mg | RECTAL | Status: DC | PRN

## 2013-03-08 MED ORDER — ASPIRIN EC 81 MG PO TBEC
81.0000 mg | DELAYED_RELEASE_TABLET | Freq: Every day | ORAL | Status: DC
  Filled 2013-03-08: qty 1

## 2013-03-08 MED ORDER — ASPIRIN 300 MG RE SUPP
300.0000 mg | Freq: Every day | RECTAL | Status: DC
  Administered 2013-03-08 – 2013-03-09 (×2): 300 mg via RECTAL
  Filled 2013-03-08 (×2): qty 1

## 2013-03-08 MED ORDER — ASPIRIN 81 MG PO CHEW: 81.0000 mg | CHEWABLE_TABLET | Freq: Every day | ORAL | Status: DC

## 2013-03-08 NOTE — ED Notes (Addendum)
Pt from home.  Pt woke this morning normal.  Pt walked to mailbox normally and fell at the mailbox at 0815.  Pt found to have R side weakness/flaccid, R facial droop, slurred speech.  [facility] called.  All times in notes by Gevena Mart.

## 2013-03-08 NOTE — ED Provider Notes (Signed)
History     CSN: [id number]  Arrival date & time 03/08/13  1478   First MD Initiated Contact with Patient 03/08/13 [phone]      Chief Complaint  Patient presents with  . Code Stroke    (Consider location/radiation/quality/duration/timing/severity/associated sxs/prior treatment) HPI Pt with history of afib, CAD and prev CVA without deficit was last seen normal at 0815 this AM. Pt walked out to get mail and collapsed. Fell into brush. No head or neck trauma. Noted to have garbled speech, and R-sided flaccid paralysis. Pt is unable to contribute to history. Not on anitcoagulants due to recurrent falls. Stroke team at bedside Past Medical History  Diagnosis Date  . Coronary artery disease     Prior CABG  . Chest pain, non-cardiac     Negative Myoview  . Pacemaker     Second degree heart block and bradycardia  . PVD (peripheral vascular disease)   . HTN (hypertension)   . Dyslipidemia   . Subarachnoid hemorrhage   . Cerebrovascular disease   . GERD (gastroesophageal reflux disease)   . Second degree AV block, Mobitz type II   . Heart block AV first degree   . SOB (shortness of breath)   . Stroke   . Glaucoma(365)   . Pleural effusion     Past Surgical History  Procedure Laterality Date  . Coronary artery bypass graft  03/18/2006  . Right carotid endarectomy  09/2004  . Left carotid to vertebral bypass    . Dual-chamber pacemaker implantation    . Thoracentesis and bronchoscopy      Family History  Problem Relation Age of Onset  . Heart attack Mother   . Lung cancer Mother   . Heart attack Father   . Tremor      History  Substance Use Topics  . Smoking status: Former Smoker    Quit date: 02/27/1951  . Smokeless tobacco: Not on file  . Alcohol Use: No      Review of Systems  Unable to perform ROS: Patient nonverbal    Allergies  Atorvastatin  Home Medications   Current Outpatient Rx  Name  Route  Sig  Dispense  Refill  . aspirin EC 81 MG tablet    Oral   Take 1 tablet (81 mg total) by mouth daily.         Marland Kitchen co-enzyme Q-10 30 MG capsule   Oral   Take 30 mg by mouth 2 (two) times daily.         . fish oil-omega-3 fatty acids 1000 MG capsule   Oral   Take 2 g by mouth daily.           . nitroGLYCERIN (NITROSTAT) 0.4 MG SL tablet   Sublingual   Place 0.4 mg under the tongue every 5 (five) minutes as needed for chest pain.         Marland Kitchen permethrin (ELIMITE) 5 % cream   Topical   Apply 1 application topically once. Took first treatment and needs second treatment on Wednesday.         . ramipril (ALTACE) 10 MG capsule   Oral   Take 10 mg by mouth daily.           . rosuvastatin (CRESTOR) 10 MG tablet   Oral   Take 10 mg by mouth. Take 1 tablet by mouth three times a week.          . travoprost, benzalkonium, (TRAVATAN) 0.004 % ophthalmic solution  Both Eyes   Place 1 drop into both eyes at bedtime.             BP 124/71  Pulse 76  Temp(Src) 97.9 F (36.6 C)  Resp 20  SpO2 100%  Physical Exam  Nursing note and vitals reviewed. Constitutional: He appears well-developed and well-nourished. No distress.  HENT:  Head: Normocephalic and atraumatic.  Mouth/Throat: Oropharynx is clear and moist.  Eyes: Pupils are equal, round, and reactive to light.  Neck: Normal range of motion. Neck supple.  No posterior cervical midline tenderness. No evidence of any trauma  Cardiovascular: Normal rate and regular rhythm.   Murmur heard. Pulmonary/Chest: Effort normal and breath sounds normal. No respiratory distress. He has no wheezes. He has no rales.  L sided pacemaker  Abdominal: Soft. Bowel sounds are normal. He exhibits no distension and no mass. There is no tenderness. There is no rebound and no guarding.  Musculoskeletal: Normal range of motion. He exhibits no edema and no tenderness.  Neurological: He is alert.  R sided gaze neglect, R facial droop, RUE/RLE paralysis, Decreased sensation R side.  Incomprehensible speech. Protecting airway.   Skin: Skin is warm and dry. No rash noted. No erythema.  Psychiatric: He has a normal mood and affect. His behavior is normal.    ED Course  Procedures (including critical care time)  Labs Reviewed  COMPREHENSIVE METABOLIC PANEL - Abnormal; Notable for the following:    Glucose, Bld 100 (*)    Total Protein 8.5 (*)    GFR calc non Af Amer 75 (*)    GFR calc Af Amer 87 (*)    All other components within normal limits  ETHANOL  PROTIME-INR  APTT  CBC  DIFFERENTIAL  TROPONIN I  POCT I-STAT, CHEM 8  POCT I-STAT TROPONIN I   Ct Head Wo Contrast  03/08/2013   *RADIOLOGY REPORT*  Clinical Data: Acute onset right sided facial droop, altered speech  CT HEAD WITHOUT CONTRAST  Technique:  Contiguous axial images were obtained from the base of the skull through the vertex without contrast.  Comparison: None.  Findings:  There is asymmetric periventricular hypodensities involving the subcortical white matter about the right lateral ventricle extending to the subcortical surface of the right parietal lobe (images 24 and 25), suggestive of infarct.  A lacunar infarct is seen within the left insular cortex (image 18).  Discrete areas of encephalomalacia compatible with prior infarcts are seen involving the right cerebellum (images six and seven).  Mild diffuse atrophy with centralized volume loss and commiserate ex vacuo dilatation of the ventricular system.  There is mild increased attenuation of the intracranial blood pool with the bilateral MCAs appearing grossly symmetrically dense given obliquity.  Given extensive background parenchymal abnormalities, there is no CT evidence of acute large territory infarct. No definite intraparenchymal or extra-axial mass or hemorrhage.  Normal size and configuration of the ventricles and basilar cisterns.  No midline shift.  Limited visualization of the paranasal sinuses demonstrates minimal mucosal thickening within the  left maxillary sinus.  The remaining paranasal sinuses and mastoid air cells appear normally aerated. Regional soft tissues appear normal.  No displaced calvarial fracture.  IMPRESSION:  1.  No definite acute large territory infarct.  No intraparenchymal or extra-axial hemorrhage. 2.  Prior infarcts involving the right subcortical parietal lobe and right cerebellum.  3.  Atrophy and advanced microvascular ischemic disease.  Above findings discussed with Dr. Leroy Kennedy at 09:39.   Original Report Authenticated By: Simonne Come  V, MD   Dg Chest Portable 1 View  03/08/2013   *RADIOLOGY REPORT*  Clinical Data: Stroke  PORTABLE CHEST - 1 VIEW  Comparison: 03/24/2009  Findings: Cardiomegaly with suspected mild interstitial edema. Moderate left pleural effusion.  Associated left lower lobe opacity, possibly atelectasis.  Postsurgical changes related to prior CABG.  Left subclavian pacemaker.  IMPRESSION: Cardiomegaly with suspected mild interstitial edema.  Moderate left pleural effusion.  Associated left lower lobe opacity, possibly atelectasis.   Original Report Authenticated By: Charline Bills, M.D.     1. CVA (cerebral vascular accident)      Date: 03/08/2013  Rate: 74  Rhythm: normal sinus rhythm  QRS Axis: indeterminate  Intervals: QRS prolonged  ST/T Wave abnormalities: indeterminate  Conduction Disutrbances: LBBB  Narrative Interpretation:   Old EKG Reviewed: unchanged    MDM  Dr Cyril Mourning at bedside to discuss tPA with family.   Per family, pt had SAH at the age of 39 and is therefore excluded from tPA.   Will admit to [facility].        Loren Racer, MD 03/08/13 [phone]

## 2013-03-08 NOTE — ED Notes (Signed)
CBG 93 

## 2013-03-08 NOTE — Consult Note (Signed)
Referring Physician: ED    Chief Complaint: code stroke: right hemiparesis, right face weakness, dysarthria.  HPI:                                                                                                                                         Frank Weeks is an 79 y.o. 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 He was last seen well at 8:15 am today. He walked to the mailbox and fell. Ambulance was called and they found him dysarthric with dense right hemiparesis and right face weakness. He has no residual deficits from prior stroke and SAH. NIHSS 22. CT brain showed no acute intracranial abnormality.   Date last known well: 03/08/13 Time last known well: 8:15 am tPA Given: no, history of aneurysmal SAH.  Past Medical History  Diagnosis Date  . Coronary artery disease     Prior CABG  . Chest pain, non-cardiac     Negative Myoview  . Pacemaker     Second degree heart block and bradycardia  . PVD (peripheral vascular disease)   . HTN (hypertension)   . Dyslipidemia   . Subarachnoid hemorrhage   . Cerebrovascular disease   . GERD (gastroesophageal reflux disease)   . Second degree AV block, Mobitz type II   . Heart block AV first degree   . SOB (shortness of breath)   . Stroke   . Glaucoma(365)   . Pleural effusion     Past Surgical History  Procedure Laterality Date  . Coronary artery bypass graft  03/18/2006  . Right carotid endarectomy  09/2004  . Left carotid to vertebral bypass    . Dual-chamber pacemaker implantation    . Thoracentesis and bronchoscopy      Family History  Problem Relation Age of Onset  . Heart attack Mother   . Lung cancer Mother   . Heart attack Father   . Tremor     Social  History:  reports that he quit smoking about 62 years ago. He does not have any smokeless tobacco history on file. He reports that he does not drink alcohol. His drug history is not on file.  Allergies:  Allergies  Allergen Reactions  . Atorvastatin Other (See Comments)    Joints sore.     Medications:  I have reviewed the patient's current medications.  ROS: unable to obtain due to patient's mental status and marked dysarthria.                                                                                                                                      History obtained from chart review and family.   Physical exam: pleasant male in no apparent distress. Blood pressure 152/87, pulse 78, resp. rate 16, SpO2 100.00%. Head: normocephalic. Neck: supple, no bruits, no JVD. Cardiac: no murmurs. Lungs: clear. Abdomen: soft, no tender, no mass. Extremities: no edema.     Neurologic Examination:                                                                                                      Mental Status: Alert, awake oriented. Marked dysarthria.  Able to follow 3 step commands. Cranial Nerves: II: Discs flat bilaterally; Visual fields grossly normal, pupils equal, round, reactive to light and accommodation III,IV, VI: ptosis not present, extra-ocular motions intact bilaterally V:facial light touch sensation normal bilaterally VII: right face weakness. VIII: hearing normal bilaterally IX,X: gag reflex present XI: bilateral shoulder shrug XII: midline tongue extension Motor: Dense right hemiparesis. Tone and bulk:normal tone throughout; no atrophy noted Sensory: Pinprick impaired right side. Deep Tendon Reflexes: 1+ and symmetric throughout Plantars: Right: upgoing   Left: downgoing Cerebellar: Unable to perform due to severe right HP. Ok in the  left. Gait:  No tested. CV: pulses palpable throughout      Results for orders placed during the hospital encounter of 03/08/13 (from the past 48 hour(s))  PROTIME-INR     Status: None   Collection Time    03/08/13  9:25 AM      Result Value Range   Prothrombin Time 13.3  11.6 - 15.2 seconds   INR 1.02  0.00 - 1.49  APTT     Status: None   Collection Time    03/08/13  9:25 AM      Result Value Range   aPTT 28  24 - 37 seconds  CBC     Status: None   Collection Time    03/08/13  9:25 AM      Result Value Range   WBC 9.9  4.0 - 10.5 K/uL   RBC 4.30  4.22 - 5.81 MIL/uL   Hemoglobin 14.1  13.0 - 17.0 g/dL   HCT 16.1  09.6 - 04.5 %   MCV 96.7  78.0 -  100.0 fL   MCH 32.8  26.0 - 34.0 pg   MCHC 33.9  30.0 - 36.0 g/dL   RDW 16.1  09.6 - 04.5 %   Platelets 185  150 - 400 K/uL  DIFFERENTIAL     Status: None   Collection Time    03/08/13  9:25 AM      Result Value Range   Neutrophils Relative % 74  43 - 77 %   Neutro Abs 7.2  1.7 - 7.7 K/uL   Lymphocytes Relative 14  12 - 46 %   Lymphs Abs 1.3  0.7 - 4.0 K/uL   Monocytes Relative 10  3 - 12 %   Monocytes Absolute 1.0  0.1 - 1.0 K/uL   Eosinophils Relative 3  0 - 5 %   Eosinophils Absolute 0.3  0.0 - 0.7 K/uL   Basophils Relative 0  0 - 1 %   Basophils Absolute 0.0  0.0 - 0.1 K/uL  POCT I-STAT TROPONIN I     Status: None   Collection Time    03/08/13  9:30 AM      Result Value Range   Troponin i, poc 0.03  0.00 - 0.08 ng/mL   Comment 3            Comment: Due to the release kinetics of cTnI,     a negative result within the first hours     of the onset of symptoms does not rule out     myocardial infarction with certainty.     If myocardial infarction is still suspected,     repeat the test at appropriate intervals.  POCT I-STAT, CHEM 8     Status: None   Collection Time    03/08/13  9:31 AM      Result Value Range   Sodium 142  135 - 145 mEq/L   Potassium 4.5  3.5 - 5.1 mEq/L   Chloride 106  96 - 112 mEq/L   BUN  17  6 - 23 mg/dL   Creatinine, Ser 4.09  0.50 - 1.35 mg/dL   Glucose, Bld 98  70 - 99 mg/dL   Calcium, Ion 8.11  9.14 - 1.30 mmol/L   TCO2 29  0 - 100 mmol/L   Hemoglobin 15.0  13.0 - 17.0 g/dL   HCT 78.2  95.6 - 21.3 %   Ct Head Wo Contrast  03/08/2013   *RADIOLOGY REPORT*  Clinical Data: Acute onset right sided facial droop, altered speech  CT HEAD WITHOUT CONTRAST  Technique:  Contiguous axial images were obtained from the base of the skull through the vertex without contrast.  Comparison: None.  Findings:  There is asymmetric periventricular hypodensities involving the subcortical white matter about the right lateral ventricle extending to the subcortical surface of the right parietal lobe (images 24 and 25), suggestive of infarct.  A lacunar infarct is seen within the left insular cortex (image 18).  Discrete areas of encephalomalacia compatible with prior infarcts are seen involving the right cerebellum (images six and seven).  Mild diffuse atrophy with centralized volume loss and commiserate ex vacuo dilatation of the ventricular system.  There is mild increased attenuation of the intracranial blood pool with the bilateral MCAs appearing grossly symmetrically dense given obliquity.  Given extensive background parenchymal abnormalities, there is no CT evidence of acute large territory infarct. No definite intraparenchymal or extra-axial mass or hemorrhage.  Normal size and configuration of the ventricles and basilar cisterns.  No midline shift.  Limited visualization of the paranasal sinuses demonstrates minimal mucosal thickening within the left maxillary sinus.  The remaining paranasal sinuses and mastoid air cells appear normally aerated. Regional soft tissues appear normal.  No displaced calvarial fracture.  IMPRESSION:  1.  No definite acute large territory infarct.  No intraparenchymal or extra-axial hemorrhage. 2.  Prior infarcts involving the right subcortical parietal lobe and right  cerebellum.  3.  Atrophy and advanced microvascular ischemic disease.  Above findings discussed with Dr. Leroy Kennedy at 09:39.   Original Report Authenticated By: Tacey Ruiz, MD      Assessment: 79 y.o. male with acute onset right hemiparesis, right face weakness, and dysarthria. NIHSS 22. Unremarkable CT brain for acute abnormality. Patient is within the window for IV tpa BUT prior history of anurysmal SAH is an absolute contraindication for thrombolysis and I explained this issue in details to the family.     Stroke Risk Factors - age, hypertension, hyperlipidemia, CAD, prior stroke.  Plan: 1. HgbA1c, fasting lipid panel 2. CT brain in am ( can not have MRI due to pacemaker) 3. PT consult, OT consult, Speech consult 4. Echocardiogram 5. Carotid dopplers 6. Prophylactic therapy-aspirin 81 mg daily. 7. Risk factor modification 8. Telemetry monitoring 9. Frequent neuro checks    Wyatt Portela, MD [facility] [phone]  03/08/2013, 10:13 AM

## 2013-03-08 NOTE — H&P (Signed)
[facility] History and Physical  GLENWOOD REVOIR ZOX:[id number] DOB: 05-22-1929 DOA: 03/08/2013  Referring physician: EDP PCP: [id number] PERINI,MARK A, MD  Specialists: Cards- Dr.Klein  Chief Complaint: fall, and R sided weakness  HPI: Frank Weeks is a 79 y.o. 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 EMS arrived and noted that he was had dense R hemiparesis, dysarthria. He was brought to [facility] as code stroke, was not given TPA due to H/o SAH. CT head negative for acute changes    Review of Systems: The patient de in nies anorexia, fever, weight loss, vision loss, decreased hearing, hoarsin the model" and [employer] est pain, syncope, dyspnea on exertion, peripheral edema, balance deficits, hemoptysis, abdominal pain, melena, hematochezia, severe indigestion/heartburn, hematuria, incontinence, genital sores, muscle weakness, suspicious skin lesions, transient blindness, difficulty walking, depression, unusual weight change, abnormal bleeding, enlarged lymph nodes, angioedema, and breast masses.    Past Medical History  Diagnosis Date  . Coronary artery disease     Prior CABG  . Chest pain, non-cardiac     Negative Myoview  . Pacemaker     Second degree heart block and bradycardia  . PVD (peripheral vascular disease)   . HTN (hypertension)   . Dyslipidemia   . Subarachnoid hemorrhage   . Cerebrovascular disease   . GERD (gastroesophageal reflux disease)   . Second degree AV block, Mobitz type II   . Heart block AV first degree   . SOB (shortness of breath)   . Stroke   . Glaucoma(365)   . Pleural effusion    Past Surgical History  Procedure Laterality Date  . Coronary artery bypass graft  03/18/2006  . Right carotid endarectomy  09/2004  . Left carotid to vertebral bypass    . Dual-chamber pacemaker implantation    . Thoracentesis and bronchoscopy     Social  History:  reports that he quit smoking about 62 years ago. He does not have any smokeless tobacco history on file. He reports that he does not drink alcohol. His drug history is not on file.   Allergies  Allergen Reactions  . Atorvastatin Other (See Comments)    Joints sore.     Family History  Problem Relation Age of Onset  . Heart attack Mother   . Lung cancer Mother   . Heart attack Father   . Tremor      Prior to Admission medications   Medication Sig Start Date End Date Taking? Authorizing Provider  aspirin EC 81 MG tablet Take 1 tablet (81 mg total) by mouth daily. 06/12/11  Yes Duke Salvia, MD  co-enzyme Q-10 30 MG capsule Take 30 mg by mouth 2 (two) times daily.   Yes Historical Provider, MD  fish oil-omega-3 fatty acids 1000 MG capsule Take 2 g by mouth daily.     Yes Historical Provider, MD  nitroGLYCERIN (NITROSTAT) 0.4 MG SL tablet Place 0.4 mg under the tongue every 5 (five) minutes as needed for chest pain.   Yes Historical Provider, MD  permethrin (ELIMITE) 5 % cream Apply 1 application topically once. Took first treatment and needs second treatment on Wednesday.   Yes Historical Provider, MD  ramipril (ALTACE) 10 MG capsule Take 10 mg by mouth daily.     Yes Historical Provider, MD  rosuvastatin (CRESTOR) 10 MG tablet Take 10 mg by mouth. Take 1 tablet by mouth  three times a week.    Yes Historical Provider, MD  travoprost, benzalkonium, (TRAVATAN) 0.004 % ophthalmic solution Place 1 drop into both eyes at bedtime.     Yes Historical Provider, MD   Physical Exam: Filed Vitals:   03/08/13 0944  BP: 152/87  Pulse: 78  Resp: 16  SpO2: 100%     Gen.: Lethargic with right neglect, oriented to self and place  HEENT: Pupils relatively dilated, reactive to light, oral mucosa moist and pink, didn't appreciate JVD  Cardiovascular: S1-S2 regular rate rhythm, systolic murmur noted  Respiratory: CTAB  Abdomen: Soft nontender with normal bowel sounds no organomegaly    Skin: No rashes or skin breakdown  Musculoskeletal: No edema clubbing cyanosis  Psychiatric: Flat affect lung is bicipital is not clear exactly  Neurologic:   There is cranial nerves 2-12 intact  Right hemineglect       Motor RUE and RLE  flaccid hemiparesis        sensory light touch intact       Reflexes 2+       Plantars mute bilaterally    Labs on Admission:  Basic Metabolic Panel:  Recent Labs Lab 03/08/13 0925 03/08/13 0931  NA 137 142  K 4.5 4.5  CL 102 106  CO2 25  --   GLUCOSE 100* 98  BUN 15 17  CREATININE 0.96 0.90  CALCIUM 9.5  --    Liver Function Tests:  Recent Labs Lab 03/08/13 0925  AST 21  ALT 18  ALKPHOS 93  BILITOT 0.4  PROT 8.5*  ALBUMIN 3.6   No results found for this basename: LIPASE, AMYLASE,  in the last 168 hours No results found for this basename: AMMONIA,  in the last 168 hours CBC:  Recent Labs Lab 03/08/13 0925 03/08/13 0931  WBC 9.9  --   NEUTROABS 7.2  --   HGB 14.1 15.0  HCT 41.6 44.0  MCV 96.7  --   PLT 185  --    Cardiac Enzymes:  Recent Labs Lab 03/08/13 0925  TROPONINI <0.30    BNP (last 3 results) No results found for this basename: PROBNP,  in the last 8760 hours CBG: No results found for this basename: GLUCAP,  in the last 168 hours  Radiological Exams on Admission: Ct Head Wo Contrast  03/08/2013   *RADIOLOGY REPORT*  Clinical Data: Acute onset right sided facial droop, altered speech  CT HEAD WITHOUT CONTRAST  Technique:  Contiguous axial images were obtained from the base of the skull through the vertex without contrast.  Comparison: None.  Findings:  There is asymmetric periventricular hypodensities involving the subcortical white matter about the right lateral ventricle extending to the subcortical surface of the right parietal lobe (images 24 and 25), suggestive of infarct.  A lacunar infarct is seen within the left insular cortex (image 18).  Discrete areas of encephalomalacia compatible with  prior infarcts are seen involving the right cerebellum (images six and seven).  Mild diffuse atrophy with centralized volume loss and commiserate ex vacuo dilatation of the ventricular system.  There is mild increased attenuation of the intracranial blood pool with the bilateral MCAs appearing grossly symmetrically dense given obliquity.  Given extensive background parenchymal abnormalities, there is no CT evidence of acute large territory infarct. No definite intraparenchymal or extra-axial mass or hemorrhage.  Normal size and configuration of the ventricles and basilar cisterns.  No midline shift.  Limited visualization of the paranasal sinuses demonstrates minimal mucosal thickening within the  left maxillary sinus.  The remaining paranasal sinuses and mastoid air cells appear normally aerated. Regional soft tissues appear normal.  No displaced calvarial fracture.  IMPRESSION:  1.  No definite acute large territory infarct.  No intraparenchymal or extra-axial hemorrhage. 2.  Prior infarcts involving the right subcortical parietal lobe and right cerebellum.  3.  Atrophy and advanced microvascular ischemic disease.  Above findings discussed with Dr. Leroy Kennedy at 09:39.   Original Report Authenticated By: Tacey Ruiz, MD   Dg Chest Portable 1 View  03/08/2013   *RADIOLOGY REPORT*  Clinical Data: Stroke  PORTABLE CHEST - 1 VIEW  Comparison: 03/24/2009  Findings: Cardiomegaly with suspected mild interstitial edema. Moderate left pleural effusion.  Associated left lower lobe opacity, possibly atelectasis.  Postsurgical changes related to prior CABG.  Left subclavian pacemaker.  IMPRESSION: Cardiomegaly with suspected mild interstitial edema.  Moderate left pleural effusion.  Associated left lower lobe opacity, possibly atelectasis.   Original Report Authenticated By: Charline Bills, M.D.    EKG: Independently reviewed. IVCD unchanged from prior Assessment/Plan Active Problems:   HYPERTENSION, UNSPECIFIED    CAD, ARTERY BYPASS GRAFT   Atrioventricular block, complete   Atrial fibrillation   Subarachnoid hemorrhage   Hemiparesis  1. CVA-suspect L MCA -Seen as Code stroke by [facility], not felt to be a TPA candidate due to prior history of aneurysmal SAH  -Admit to SDU given extent of symptoms and risk of evolution -monitor on tele -CT head with prior infarcts -ASA: rectal vs Oral  -Cannot have MRI due to Pacemaker -FU CT HEad in am -Check ECHO/carotid -lipids, Hbaic -PT/OT/ST eval  2. CAD/CABG/pacemaker  -cotninue ASA/Statin  3. H/o P afib: transient episodes per family -followed by Dr.Klein -was not on anticoagulation  4. HTN:  -stable, hold lisinopril to allow for permissible HTN  DVT porph: lovenox  Code Status: DNR Family Communication:d/w pt and son at bedside Disposition Plan: Stepdown  Time spent:  Emberlynn Riggan [facility] Pager [phone]  If 7PM-7AM, please contact night-coverage [email or web address] Password [facility] 03/08/2013, 12:31 PM

## 2013-03-08 NOTE — Code Documentation (Signed)
Code stroke called at 0900, Patient arrived to [facility] ED via [facility] at 0922, EDP seen at [phone], stroke team at 0915, patient arrived to Ct scan at 0927, lab arrived at 0908, pharmacy notified to mix TPa at [phone].  NIHSS 21.  pateint was LSN at 0815, he awoke in his usual health and went to mail box and fell in the bushes and had right side flaccid, droop and garbled speech.  TPA was not given due to hx of SAH.

## 2013-03-08 NOTE — Progress Notes (Signed)
VASCULAR LAB PRELIMINARY  PRELIMINARY  PRELIMINARY  PRELIMINARY  Carotid Dopplers completed.    Preliminary report:  There is no significant ICA stenosis. The right CEA appears patent.  Vertebral artery flow is antegrade.  Frank Weeks, RVT 03/08/2013, 11:54 AM

## 2013-03-09 ENCOUNTER — Encounter (HOSPITAL_COMMUNITY): Payer: Self-pay | Admitting: Radiology

## 2013-03-09 ENCOUNTER — Inpatient Hospital Stay (HOSPITAL_COMMUNITY): Payer: Medicare Other

## 2013-03-09 DIAGNOSIS — I359 Nonrheumatic aortic valve disorder, unspecified: Secondary | ICD-10-CM

## 2013-03-09 DIAGNOSIS — I6350 Cerebral infarction due to unspecified occlusion or stenosis of unspecified cerebral artery: Secondary | ICD-10-CM

## 2013-03-09 LAB — LIPID PANEL
Cholesterol: 137 mg/dL (ref 0–200)
LDL Cholesterol: 83 mg/dL (ref 0–99)
Total CHOL/HDL Ratio: 4.9 RATIO
VLDL: 26 mg/dL (ref 0–40)

## 2013-03-09 LAB — GLUCOSE, CAPILLARY
Glucose-Capillary: 110 mg/dL — ABNORMAL HIGH (ref 70–99)
Glucose-Capillary: 125 mg/dL — ABNORMAL HIGH (ref 70–99)

## 2013-03-09 MED ORDER — RESOURCE THICKENUP CLEAR PO POWD
ORAL | Status: DC | PRN
  Filled 2013-03-09: qty 125

## 2013-03-09 MED ORDER — ASPIRIN 325 MG PO TABS
325.0000 mg | ORAL_TABLET | Freq: Every day | ORAL | Status: DC
  Administered 2013-03-09 – 2013-03-11 (×3): 325 mg via ORAL
  Filled 2013-03-09 (×3): qty 1

## 2013-03-09 NOTE — Procedures (Signed)
Objective Swallowing Evaluation: Modified Barium Swallowing Study  Patient Details  Name: Frank Weeks MRN: [id number] Date of Birth: [birth year]  Today's Date: 03/09/2013 Time: 4098-1191 SLP Time Calculation (min): 29 min  Past Medical History:  Past Medical History  Diagnosis Date  . Coronary artery disease     Prior CABG  . Chest pain, non-cardiac     Negative Myoview  . Pacemaker     Second degree heart block and bradycardia  . PVD (peripheral vascular disease)   . HTN (hypertension)   . Dyslipidemia   . Subarachnoid hemorrhage   . Cerebrovascular disease   . GERD (gastroesophageal reflux disease)   . Second degree AV block, Mobitz type II   . Heart block AV first degree   . SOB (shortness of breath)   . Stroke   . Glaucoma(365)   . Pleural effusion    Past Surgical History:  Past Surgical History  Procedure Laterality Date  . Coronary artery bypass graft  03/18/2006  . Right carotid endarectomy  09/2004  . Left carotid to vertebral bypass    . Dual-chamber pacemaker implantation    . Thoracentesis and bronchoscopy     HPI:  79 y.o. male with acute onset right hemiparesis, right face weakness, and dysarthria. Demosntrated overt evidence of aspiration at bedside, MBS to objectively evaluate function.      Assessment / Plan / Recommendation Clinical Impression  Dysphagia Diagnosis: Moderate oral phase dysphagia;Moderate pharyngeal phase dysphagia Clinical impression: Pt presents with a sensorimotor oral dysphagia with right oral weakness and numbness and lingual weakness leading to moderate oral residuals and anterior spillage. With max assist to place bites in on the left and sweep right buccal cavity, the pt can manage soft solids. Will attempt in therapy.   Pharyngeal phase characterized primarily by sensory deficits with decreased sensation for swallow trigger. Thin liquids are aspirated before the swallow with sensation of only significant aspiration. Cough is not  sufficient to expel aspirate. Recommend Dys 1 (puree) diet and nectar thick liquids via straw with full supervision. SLP will follow for tolerance and therapeutic intervention.     Treatment Recommendation  Therapy as outlined in treatment plan below    Diet Recommendation Dysphagia 1 (Puree);Nectar-thick liquid   Liquid Administration via: Straw Medication Administration: Crushed with puree Supervision: Full supervision/cueing for compensatory strategies;Patient able to self feed Compensations: Slow rate;Small sips/bites;Check for pocketing;Check for anterior loss Postural Changes and/or Swallow Maneuvers: Seated upright 90 degrees;Out of bed for meals    Other  Recommendations Recommended Consults: MBS Oral Care Recommendations: Oral care BID;Oral care before and after PO (clear out mouth after PO) Other Recommendations: Order thickener from pharmacy   Follow Up Recommendations  Inpatient Rehab    Frequency and Duration min 2x/week  2 weeks   Pertinent Vitals/Pain NA    SLP Swallow Goals Patient will utilize recommended strategies during swallow to increase swallowing safety with: Moderate cueing Swallow Study Goal #2 - Progress: Progressing toward goal   General HPI: 79 y.o. male with acute onset right hemiparesis, right face weakness, and dysarthria. Demosntrated overt evidence of aspiration at bedside, MBS to objectively evaluate function.  Type of Study: Modified Barium Swallowing Study Reason for Referral: Objectively evaluate swallowing function Diet Prior to this Study: NPO Temperature Spikes Noted: No Respiratory Status: Room air History of Recent Intubation: No Behavior/Cognition: Alert;Cooperative;Pleasant mood Oral Cavity - Dentition: Adequate natural dentition Oral Motor / Sensory Function: Impaired - see Bedside swallow eval Self-Feeding Abilities: Able to feed  self;Needs assist Patient Positioning: Upright in chair Baseline Vocal Quality: Clear;Low vocal  intensity Volitional Cough: Weak Volitional Swallow: Able to elicit Anatomy: Within functional limits Pharyngeal Secretions: Not observed secondary MBS    Reason for Referral Objectively evaluate swallowing function   Oral Phase Oral Preparation/Oral Phase Oral Phase: Impaired Oral - Nectar Oral - Nectar Cup: Right anterior bolus loss;Weak lingual manipulation;Right pocketing in lateral sulci;Delayed oral transit Oral - Nectar Straw: Delayed oral transit;Weak lingual manipulation Oral - Thin Oral - Thin Cup: Right anterior bolus loss;Weak lingual manipulation;Right pocketing in lateral sulci;Delayed oral transit Oral - Solids Oral - Puree: Right anterior bolus loss;Weak lingual manipulation;Right pocketing in lateral sulci;Delayed oral transit Oral - Mechanical Soft: Right anterior bolus loss;Weak lingual manipulation;Right pocketing in lateral sulci;Delayed oral transit Oral - Pill: Delayed oral transit;Pocketing in anterior sulcus;Reduced posterior propulsion   Pharyngeal Phase Pharyngeal Phase Pharyngeal Phase: Impaired Pharyngeal - Nectar Pharyngeal - Nectar Cup: Delayed swallow initiation;Premature spillage to pyriform sinuses Pharyngeal - Nectar Straw: Delayed swallow initiation;Premature spillage to pyriform sinuses Penetration/Aspiration details (nectar straw): Material does not enter airway Pharyngeal - Thin Pharyngeal - Thin Cup: Delayed swallow initiation;Premature spillage to pyriform sinuses;Penetration/Aspiration before swallow;Significant aspiration (Amount) Penetration/Aspiration details (thin cup): Material enters airway, passes BELOW cords without attempt by patient to eject out (silent aspiration);Material enters airway, passes BELOW cords and not ejected out despite cough attempt by patient (chin tuck increases aspiration. ) Pharyngeal - Solids Pharyngeal - Puree: Delayed swallow initiation;Premature spillage to valleculae Pharyngeal - Mechanical Soft: Premature  spillage to valleculae;Delayed swallow initiation Pharyngeal - Pill: Not tested  Cervical Esophageal Phase    GO   Frank Ditty, MA CCC-SLP [phone]            Claudine Mouton 03/09/2013, 11:33 AM

## 2013-03-09 NOTE — Progress Notes (Signed)
  Echocardiogram 2D Echocardiogram has been performed.  Frank Weeks 03/09/2013, 3:41 PM

## 2013-03-09 NOTE — Evaluation (Signed)
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  Recent CT shows developing hypodensity in left basal ganglia, external capsule region consistent with acute infarct.  He presents with decreased independence with mobility due to impaired balance and body spatial awarenss, decreased right visual field, decreased strength with right hemiparesis and will benefit from skilled PT in the acute setting to maximize independence and allow return home with supervision level assist following CIR stay.    PT Assessment  Patient needs continued PT services    Follow Up Recommendations  CIR    Does the patient have the potential to tolerate intense rehabilitation    Yes  Barriers to Discharge  Caregiver can provide supervision only      Equipment Recommendations  Other (comment) (To be assessed)    Recommendations for Other Services Rehab consult   Frequency Min 4X/week    Precautions / Restrictions Precautions Precautions: Fall Precaution Comments: expressive aphasia, swallow precautions   Pertinent Vitals/Pain No pain complaints      Mobility  Bed Mobility Bed Mobility: Supine to Sit;Sitting - Scoot to Edge of Bed Supine to Sit: 1: +2 Total assist Supine to Sit: Patient Percentage: 40% Sitting - Scoot to Edge of Bed: 2: Max assist Details for Bed Mobility Assistance: assisted right leg off edge of bed and to lift trunk pt pulled up on therapist's hand with left UE Transfers Transfers: Sit to Stand;Stand to Sit;Stand Pivot Transfers Sit to Stand: From bed;1: +2 Total assist Sit to Stand: Patient Percentage: 40% Stand to Sit: To bed;To chair/3-in-1;1: +2 Total  assist Stand to Sit: Patient Percentage: 40% Stand Pivot Transfers: 1: +2 Total assist Stand Pivot Transfers: Patient Percentage: 30% Details for Transfer Assistance: assist to position right foot on floor and to increased base of support due to pt leaning right, mild pusher tendency    Exercises General Exercises - Lower Extremity Hip Flexion/Marching: PROM;5 reps;Supine;Right Toe Raises: PROM;5 reps;Supine;Right   PT Diagnosis: Abnormality of gait;Hemiplegia dominant side  PT Problem List: Decreased strength;Decreased mobility;Impaired sensation;Decreased balance;Decreased safety awareness;Decreased knowledge of use of DME PT Treatment Interventions: DME instruction;Balance training;Neuromuscular re-education;Gait training;Patient/family education;Functional mobility training;Therapeutic activities;Therapeutic exercise;Wheelchair mobility training   PT Goals Acute Rehab PT Goals PT Goal Formulation: With patient Time For Goal Achievement: 03/23/13 Potential to Achieve Goals: Good Pt will go Supine/Side to Sit: with supervision PT Goal: Supine/Side to Sit - Progress: Goal set today Pt will Sit at Edge of Bed: with modified independence;with unilateral upper extremity support PT Goal: Sit at Edge Of Bed - Progress: Goal set today Pt will go Sit to Stand: with min assist PT Goal: Sit to Stand - Progress: Goal set today Pt will Transfer Bed to Chair/Chair to Bed: with mod assist PT Transfer Goal: Bed to Chair/Chair to Bed - Progress: Goal set today Pt will Stand: with min assist;3 - 5 min;with unilateral upper extremity support PT Goal: Stand - Progress: Goal set today Pt will Ambulate: with +2 total assist;16 - 50 feet PT Goal: Ambulate - Progress: Goal set today  Visit Information  Last PT Received On: 03/09/13 Assistance Needed: +2 PT/OT Co-Evaluation/Treatment: Yes    Subjective Data  Subjective: Mumbles, attempts to sing "Happy Birthday" Patient Stated  Goal: To return home  after rehab   Prior Functioning  Home Living Lives With: Spouse Available Help at Discharge: Family;Available 24 hours/day (wife can only provide supervision) Type of Home: House Home Access: Stairs to enter [employer] of Steps: 1 Entrance Stairs-Rails: None Home Layout: Two level;Able to live on main level with bedroom/bathroom Bathroom Shower/Tub: Health visitor:  (comfort height) Home Adaptive Equipment: Walker - standard Prior Function Level of Independence: Independent Communication Communication: Expressive difficulties Dominant Hand: Right    Cognition  Cognition Arousal/Alertness: Awake/alert Behavior During Therapy: WFL for tasks assessed/performed Overall Cognitive Status: Difficult to assess Difficult to assess due to: Impaired communication    Extremity/Trunk Assessment Right Upper Extremity Assessment RUE ROM/Strength/Tone: Deficits RUE ROM/Strength/Tone Deficits: No movement including involuntary.  Tone WFL. Shoulder subluxation not palpable.  Hand is maintained open. RUE Sensation: Deficits RUE Sensation Deficits: Not able to formally test due to aphasia, appears to have impaired proprioceptive awareness. RUE Coordination: Deficits RUE Coordination Deficits: no functional use Right Lower Extremity Assessment RLE ROM/Strength/Tone: Deficits RLE ROM/Strength/Tone Deficits: PROM WFL with increased extensor tone noted at ankle, minimal voluntary movement noted. RLE Sensation: Deficits RLE Sensation Deficits: decreased as compared to left Left Lower Extremity Assessment LLE ROM/Strength/Tone: Within functional levels LLE Sensation: WFL - Light Touch   Balance Balance Balance Assessed: Yes Static Sitting Balance Static Sitting - Balance Support: Feet supported;Left upper extremity supported Static Sitting - Level of Assistance: 4: Min assist;3: Mod assist Static Sitting - Comment/# of Minutes: pushing to right at times, able to  correct about 50% of the time pulling over with left UE on bed rail Dynamic Sitting Balance Dynamic Sitting - Balance Support: Feet supported;Right upper extremity supported Dynamic Sitting - Level of Assistance: 4: Min assist;3: Mod assist Dynamic Sitting Balance - Compensations: reaching various directions with left UE Static Standing Balance Static Standing - Balance Support: Bilateral upper extremity supported;During functional activity Static Standing - Level of Assistance: 1: +2 Total assist Static Standing - Comment/# of Minutes: stood approx 2 minutes initially, then 1 minute during hygiene due to small bowel incontinence  End of Session PT - End of Session Equipment Utilized During Treatment: Gait belt Activity Tolerance: Patient tolerated treatment well Patient left: in chair;with call bell/phone within reach Nurse Communication: Mobility status  GP     [facility] 03/09/2013, 3:17 PM Sheran Lawless, PT [phone] 03/09/2013

## 2013-03-09 NOTE — Progress Notes (Addendum)
Stroke Team Progress Note  HISTORY Frank Weeks is an 79 y.o. male with a past medical history significant for hypertension, hyperlipidemia, CAD s/p CABG, s/p pacemaker placement, stroke without residual deficit, aneurysmal SAH at age 3 years old, brought to [facility] by medics 03/08/2013 as a code stroke due to acute onset right hemiparesis, right facial weakness, and dysarthria.  He was last seen well at 8:15 am. He walked to the mailbox and fell. Ambulance was called and they found him dysarthric with dense right hemiparesis and right face weakness.  He has no residual deficits from prior stroke and SAH.  NIHSS 22. CT brain showed no acute intracranial abnormality.  Patient was not a TPA candidate secondary to history of aneurysmal SAH. He was admitted for further evaluation and treatment.  SUBJECTIVE His wife and son are at the bedside.  Overall he feels his condition is stable. The wife stated the [facility] occurred in the 1950's - he had a terrible headache and could not walk - she says they injected dye and saw it on the xray. Said it healed itself.  OBJECTIVE Most recent Vital Signs: Filed Vitals:   03/08/13 1946 03/08/13 2319 03/09/13 0344 03/09/13 0735  BP: 109/73 134/72 117/61 146/78  Pulse: 70 77 65 73  Temp: 98.6 F (37 C) 97.9 F (36.6 C) 98.1 F (36.7 C) 97.7 F (36.5 C)  TempSrc: Oral Oral Oral Oral  Resp:  19 20 25   SpO2:  100% 100% 100%   CBG (last 3)   Recent Labs  03/08/13 0950 03/09/13 0733  GLUCAP 93 110*    IV Fluid Intake:   . sodium chloride 75 mL/hr at 03/08/13 1645    MEDICATIONS  . antiseptic oral rinse  15 mL Mouth Rinse q12n4p  . aspirin  81 mg Oral Daily  . aspirin  300 mg Rectal Daily  . chlorhexidine  15 mL Mouth Rinse BID  . enoxaparin (LOVENOX) injection  40 mg Subcutaneous Q24H  . rosuvastatin  10 mg Oral q1800  . Travoprost (BAK Free)  1 drop Both Eyes QHS   PRN:  senna-docusate  Diet:  NPO  Activity:  Bedrest DVT Prophylaxis:  Lovenox 40  mg sq daily   CLINICALLY SIGNIFICANT STUDIES Basic Metabolic Panel:  Recent Labs Lab 03/08/13 0925 03/08/13 0931  NA 137 142  K 4.5 4.5  CL 102 106  CO2 25  --   GLUCOSE 100* 98  BUN 15 17  CREATININE 0.96 0.90  CALCIUM 9.5  --    Liver Function Tests:  Recent Labs Lab 03/08/13 0925  AST 21  ALT 18  ALKPHOS 93  BILITOT 0.4  PROT 8.5*  ALBUMIN 3.6   CBC:  Recent Labs Lab 03/08/13 0925 03/08/13 0931  WBC 9.9  --   NEUTROABS 7.2  --   HGB 14.1 15.0  HCT 41.6 44.0  MCV 96.7  --   PLT 185  --    Coagulation:  Recent Labs Lab 03/08/13 0925  LABPROT 13.3  INR 1.02   Cardiac Enzymes:  Recent Labs Lab 03/08/13 0925  TROPONINI <0.30   Urinalysis: No results found for this basename: COLORURINE, APPERANCEUR, LABSPEC, PHURINE, GLUCOSEU, HGBUR, BILIRUBINUR, KETONESUR, PROTEINUR, UROBILINOGEN, NITRITE, LEUKOCYTESUR,  in the last 168 hours Lipid Panel    Component Value Date/Time   CHOL 137 03/09/2013 0055   TRIG 130 03/09/2013 0055   HDL 28* 03/09/2013 0055   CHOLHDL 4.9 03/09/2013 0055   VLDL 26 03/09/2013 0055   LDLCALC 83  03/09/2013 0055   HgbA1C  No results found for this basename: HGBA1C    Urine Drug Screen:   No results found for this basename: labopia, cocainscrnur, labbenz, amphetmu, thcu, labbarb    Alcohol Level:  Recent Labs Lab 03/08/13 0925  ETH <11   CT of the brain   03/09/2013   Developing low density in the left basal ganglia/external capsule region consistent with acute infarction.  No mass effect or hemorrhage.    03/08/2013    1.  No definite acute large territory infarct.  No intraparenchymal or extra-axial hemorrhage. 2.  Prior infarcts involving the right subcortical parietal lobe and right cerebellum.  3.  Atrophy and advanced microvascular ischemic disease.    MRI/A of the brain  pacemaker  2D Echocardiogram    Carotid Doppler  No evidence of hemodynamically significant internal carotid artery stenosis. Vertebral artery flow is  antegrade.   CXR  03/08/2013  Cardiomegaly with suspected mild interstitial edema.  Moderate left pleural effusion.  Associated left lower lobe opacity, possibly atelectasis.  EKG  normal sinus rhythm.   Therapy Recommendations   Physical Exam   GENERAL EXAM: Patient is in no distress  CARDIOVASCULAR: Regular rate and rhythm, no murmurs, no carotid bruits  NEUROLOGIC: MENTAL STATUS: awake, alert, EXPRESSIVE APHASIA; FOLLOWS SIMPLE COMMANDS. CANNOT NAME. NO INTELLIGIBLE SPEECH.  CRANIAL NERVE: pupils equal and reactive to light, visual fields full to confrontation, extraocular muscles intact, no nystagmus, facial sensation symm, DECR RIGHT LOWER FACIAL STRENGTH. Uvula midline, shoulder shrug ABSENT ON RIGHT. Tongue DEVIATES TO RIGHT. MOTOR: RUE 0, RLE 1-2/5.  SENSORY: normal and symmetric to light touch COORDINATION: NOT FOLLOWING COMMANDS. REFLEXES: deep tendon reflexes present and symmetric; RIGHT TOE UPGOING.    ASSESSMENT Frank Weeks is a 79 y.o. male presenting with acute onset right hemiparesis, right facial weakness, and dysarthria. Imaging confirms a left basal ganglia infarct. Infarct felt to be embolic secondary to known atrial fibrillation.  On aspirin 81 mg orally every day prior to admission. Now on aspirin suppository for secondary stroke prevention. Patient with resultant dysphagia, left hemiparesis. Work up underway. E t  Hx paroxsymal atrial fibrillation. Patient refused coumadin as well as difficulty gait, balance and falls Hypertension Hyperlipidemia, LDL 83, on crestor 10 PTA, now on crestor 10 mg daily, goal LDL < 100 (< 70 for diabetics) Hx SAH in 1964 (reported as aneurysmal, but no craniotomy or clipping; no evidence of aneurysm on current MRA head) CAD - CABG R CEA Pacemaker PVD Hx stroke at age 36, affecting his memory Hx "small strokes" since then with resultant loss of vision  Hospital day # 1  TREATMENT/PLAN  Continue aspirin suppository for  secondary stroke prevention. Consider anticoagulation once able to swallow with history of paroxysmal atrial fibrillation. Recommend discussion with cardiology (prefer NOAC)  OOB. Therapy evals  Check swallow. Diet per ST  Ok to transfer to 4N from stroke standpoint  Recommend rehab consult, though wife unsure she will be unable to provide care at discharge. Has 2 kids who live out of town.   Repeat CT in am  Annie Main, MSN, RN, ANVP-BC, ANP-BC, [facility] Pager: [phone] 03/09/2013 9:18 AM   I have personally obtained a history, examined the patient, evaluated imaging results, and formulated the assessment and plan of care. I agree with the above. Consider anticoagulation once able to swallow with history of paroxysmal atrial fibrillation. May need to discuss with cardiology.  Suanne Marker, MD 03/09/2013, 12:46  PM Certified in Neurology, Neurophysiology and Neuroimaging [facility] - Stroke Team  Please refer to [email or web address] for on-call Stroke MD

## 2013-03-09 NOTE — Evaluation (Signed)
Clinical/Bedside Swallow Evaluation Patient Details  Name: Frank Weeks MRN: [id number] Date of Birth: [birth year]  Today's Date: 03/09/2013 Time: 4098-1191 SLP Time Calculation (min): 34 min  Past Medical History:  Past Medical History  Diagnosis Date  . Coronary artery disease     Prior CABG  . Chest pain, non-cardiac     Negative Myoview  . Pacemaker     Second degree heart block and bradycardia  . PVD (peripheral vascular disease)   . HTN (hypertension)   . Dyslipidemia   . Subarachnoid hemorrhage   . Cerebrovascular disease   . GERD (gastroesophageal reflux disease)   . Second degree AV block, Mobitz type II   . Heart block AV first degree   . SOB (shortness of breath)   . Stroke   . Glaucoma(365)   . Pleural effusion    Past Surgical History:  Past Surgical History  Procedure Laterality Date  . Coronary artery bypass graft  03/18/2006  . Right carotid endarectomy  09/2004  . Left carotid to vertebral bypass    . Dual-chamber pacemaker implantation    . Thoracentesis and bronchoscopy     HPI:  79 y.o. male with acute onset right hemiparesis, right face weakness, and dysarthria.   Assessment / Plan / Recommendation Clinical Impression  Pt demosntrates oral dysphagia due to right oral weakness with reduced bolus manipulation and buccal residuals. There are overt signs of aspiration with thin liqudis and questionable wet vocal quality with puree. Given deficits, recommend MBS to objectively assess function prior to recommending diet, to be completed at 1030 this am.     Aspiration Risk  Moderate    Diet Recommendation NPO        Other  Recommendations Recommended Consults: MBS Oral Care Recommendations: Oral care QID   Follow Up Recommendations  Inpatient Rehab    Frequency and Duration min 2x/week  2 weeks   Pertinent Vitals/Pain NA    SLP Swallow Goals     Swallow Study Prior Functional Status       General HPI: 79 y.o. male with acute onset  right hemiparesis, right face weakness, and dysarthria. Type of Study: Bedside swallow evaluation Diet Prior to this Study: NPO Temperature Spikes Noted: No Respiratory Status: Room air History of Recent Intubation: No Behavior/Cognition: Alert;Cooperative;Pleasant mood Oral Cavity - Dentition: Adequate natural dentition Self-Feeding Abilities: Able to feed self;Needs assist Patient Positioning: Upright in bed Baseline Vocal Quality: Clear Volitional Cough: Cognitively unable to elicit Volitional Swallow: Able to elicit    Oral/Motor/Sensory Function Overall Oral Motor/Sensory Function: Impaired Labial ROM: Reduced right Labial Symmetry: Abnormal symmetry right Labial Strength: Reduced Labial Sensation: Reduced Lingual ROM: Reduced right Lingual Symmetry: Abnormal symmetry right Lingual Strength: Reduced Lingual Sensation: Reduced Facial ROM: Reduced right Facial Symmetry: Right droop Facial Strength: Reduced Facial Sensation: Reduced   Ice Chips Ice chips: Impaired Presentation: Spoon Oral Phase Impairments: Reduced labial seal;Reduced lingual movement/coordination;Impaired anterior to posterior transit Oral Phase Functional Implications: Right anterior spillage;Prolonged oral transit Pharyngeal Phase Impairments: Throat Clearing - Immediate   Thin Liquid Thin Liquid: Impaired Presentation: Cup Oral Phase Impairments: Reduced labial seal;Reduced lingual movement/coordination;Impaired anterior to posterior transit Oral Phase Functional Implications: Right anterior spillage;Right lateral sulci pocketing;Prolonged oral transit Pharyngeal  Phase Impairments: Suspected delayed Swallow;Decreased hyoid-laryngeal movement;Wet Vocal Quality;Throat Clearing - Immediate;Cough - Immediate    Nectar Thick Nectar Thick Liquid: Not tested   Honey Thick Honey Thick Liquid: Not tested   Puree Puree: Impaired Presentation: Spoon;Self Fed Oral Phase Impairments:  Reduced labial seal;Reduced  lingual movement/coordination;Impaired anterior to posterior transit Oral Phase Functional Implications: Right anterior spillage;Prolonged oral transit;Oral residue   Solid   GO    Solid: Not tested      Harlon Ditty, MA CCC-SLP [phone]  Claudine Mouton 03/09/2013,9:26 AM

## 2013-03-09 NOTE — Progress Notes (Signed)
[facility] Progress Note [place] TEAM 1 - Stepdown/ICU TEAM   YANNI QUIROA WUJ:[id number] DOB: 11-Feb-1929 DOA: 03/08/2013 PCP: Ezequiel Kayser, MD  Brief narrative: 79 y.o. male w/ hx of CAD, CABG, pacemaker, aneurysmal SAH 50 years ago, CVA was walking to the mailbox when he fell.  His wife saw him fall and called 911. EMS arrived and noted that he had a dense R hemiparesis with dysarthria.  He was brought to [facility] as code stroke, was not given TPA due to h/o SAH.  Initial CT head was negative for acute changes.  Assessment/Plan:  CVA - suspect L basal ganglia/external capsule aspirin for secondary stroke prevention - Neuro suggests considering anticoagulation with history of paroxysmal atrial fibrillation - cleared for D1 nectar diet per SLP - pt is followed by [facility] for afib, therefore will ask them to see tomorrow and comment on anticoagulation   CAD s/p CABG 2007 cotninue ASA/Statin  PVD s/p R CEA No signif stenosis noted via dopplers  H/o Parox afib -followed by Dr.Klein  -was not on anticoagulation, though wife reports Dr. Graciela Husbands had suggested coumadin previously and pt himself decided against it - will ask [place] [facility] to comment on [place]  HTN:  Stable - cont to hold lisinopril for now to avoid potential relative hypotension   S/p pacemaker due to second degree heart block and bradycardia   HLD On oral agent   Scabies Was diagnoses by Dr. Margo Aye (Dermatology) as an outpt - is due a final dose of permetherin cream on Wednesday (wife will bring from home) - wife has undergone empiric tx as well   Code Status: NO CODE BLUE Family Communication: spoke to pt, daughter, and wife at bedside Disposition Plan: transfer to tele bed on 4N  Consultants: Stroke Team   Procedures: 5/25 - carotid dopplers - no significant ICA stenosis - right CEA appears patent - vertebral artery flow is antegrade 5/26 - TTE - pending   Antibiotics: none  DVT  prophylaxis: lovenox  HPI/Subjective: Pt is alert and interactive, though dysarthric.  He does not appear uncomfortable.  He is somewhat sleepy.    Objective: Blood pressure 134/71, pulse 72, temperature 97.4 F (36.3 C), temperature source Axillary, resp. rate 23, SpO2 98.00%.  Intake/Output Summary (Last 24 hours) at 03/09/13 1644 Last data filed at 03/09/13 1539  Gross per 24 hour  Intake   1625 ml  Output   1500 ml  Net    125 ml   Exam: General: No acute respiratory distress Lungs: Clear to auscultation bilaterally without wheezes or crackles Cardiovascular: Regular rate and rhythm w/ 2/6 holosystolic M Abdomen: Nontender, nondistended, soft, bowel sounds positive, no rebound, no ascites, no appreciable mass Extremities: No significant cyanosis, clubbing, or edema bilateral lower extremities  Data Reviewed: Basic Metabolic Panel:  Recent Labs Lab 03/08/13 0925 03/08/13 0931  NA 137 142  K 4.5 4.5  CL 102 106  CO2 25  --   GLUCOSE 100* 98  BUN 15 17  CREATININE 0.96 0.90  CALCIUM 9.5  --    Liver Function Tests:  Recent Labs Lab 03/08/13 0925  AST 21  ALT 18  ALKPHOS 93  BILITOT 0.4  PROT 8.5*  ALBUMIN 3.6   CBC:  Recent Labs Lab 03/08/13 0925 03/08/13 0931  WBC 9.9  --   NEUTROABS 7.2  --   HGB 14.1 15.0  HCT 41.6 44.0  MCV 96.7  --   PLT 185  --    Cardiac Enzymes:  Recent Labs Lab 03/08/13 0925  TROPONINI <0.30   CBG:  Recent Labs Lab 03/08/13 0950 03/09/13 0733 03/09/13 1146 03/09/13 1534  GLUCAP 93 110* 125* 138*    Recent Results (from the past 240 hour(s))  MRSA PCR SCREENING     Status: None   Collection Time    03/08/13  3:13 PM      Result Value Range Status   MRSA by PCR NEGATIVE  NEGATIVE Final   Comment:            The GeneXpert MRSA Assay (FDA     approved for NASAL specimens     only), is one component of a     comprehensive MRSA colonization     surveillance program. It is not     intended to diagnose  MRSA     infection nor to guide or     monitor treatment for     MRSA infections.     Studies:  Recent x-ray studies have been reviewed in detail by the Attending Physician  Scheduled Meds:  Scheduled Meds: . antiseptic oral rinse  15 mL Mouth Rinse q12n4p  . aspirin  300 mg Rectal Daily  . chlorhexidine  15 mL Mouth Rinse BID  . enoxaparin (LOVENOX) injection  40 mg Subcutaneous Q24H  . rosuvastatin  10 mg Oral q1800  . Travoprost (BAK Free)  1 drop Both Eyes QHS   Time spent on care of this patient:   Lonia Blood  [facility] Office  [phone] Pager - Text Page per Loretha Stapler as per below:  On-Call/Text Page:      [email or web address]      password [email or web address]  If 7PM-7AM, please contact night-coverage [email or web address] Password [email or web address] 03/09/2013, 4:44 PM   LOS: 1 day

## 2013-03-09 NOTE — Evaluation (Signed)
Speech Language Pathology Evaluation Patient Details Name: Frank Weeks MRN: [id number] DOB: 02/01/1929 Today's Date: 03/09/2013 Time: 4098-1191 SLP Time Calculation (min): 33 min  Problem List:  Patient Active Problem List   Diagnosis Date Noted  . Hemiparesis 03/08/2013  . Falls 06/17/2012  . Fatigue 06/12/2011  . Chest pain, non-cardiac   . Pacemaker-[employer]   . DIASTOLIC HEART FAILURE, ACUTE 47/82/9562  . Atrial fibrillation 11/04/2009  . DYSLIPIDEMIA 07/08/2009  . GLAUCOMA 07/08/2009  . HYPERTENSION, UNSPECIFIED 07/08/2009  . CAD, ARTERY BYPASS GRAFT 07/08/2009  . Subarachnoid hemorrhage 07/08/2009  . CEREBROVASCULAR DISEASE 07/08/2009  . PERIPHERAL VASCULAR DISEASE 07/08/2009  . PLEURAL EFFUSION 07/08/2009  . GASTROESOPHAGEAL REFLUX DISEASE 07/08/2009  . 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 SHORTNESS OF BREATH 03/03/2009   Past Medical History:  Past Medical History  Diagnosis Date  . Coronary artery disease     Prior CABG  . Chest pain, non-cardiac     Negative Myoview  . Pacemaker     Second degree heart block and bradycardia  . PVD (peripheral vascular disease)   . HTN (hypertension)   . Dyslipidemia   . Subarachnoid hemorrhage   . Cerebrovascular disease   . GERD (gastroesophageal reflux disease)   . Second degree AV block, Mobitz type II   . Heart block AV first degree   . SOB (shortness of breath)   . Stroke   . Glaucoma(365)   . Pleural effusion    Past Surgical History:  Past Surgical History  Procedure Laterality Date  . Coronary artery bypass graft  03/18/2006  . Right carotid endarectomy  09/2004  . Left carotid to vertebral bypass    . Dual-chamber pacemaker implantation    . Thoracentesis and bronchoscopy     HPI:  79 y.o. male with acute onset right hemiparesis, right face weakness, and dysarthria.   Assessment / Plan / Recommendation Clinical Impression  Pt presents with aphasia characterized by mild receptive deficits and,  at this time, severe expressive deficits that are also complicated by dysarthria and possibly oral apraxia. Pt is able to follow 1-2 step commands, read at word level, identify line drawings and objects if presented in far left visual field. He is able to direct attention to midline, but suspect there is a field cut that impairs vision even into the left visual field. His verbalizations are severely unintelligible, he is able to approximate some vowel sounds, but he makes limited to no articulatory contact for consonants and with max cues he gropes to copy model. Pt will benefit from acute SLP services to maximize functional communication. Recommend CIR at d/c.     SLP Assessment  Patient needs continued Speech Lanaguage Pathology Services    Follow Up Recommendations  Inpatient Rehab    Frequency and Duration min 2x/week  2 weeks   Pertinent Vitals/Pain NA   SLP Goals  SLP Goals Potential to Achieve Goals: Good Progress/Goals/Alternative treatment plan discussed with pt/caregiver and they: Agree SLP Goal #1: Pt will correctly respond to basic enviornmental/biographical Y/N questions with min verbal cues and 80% accuracy SLP Goal #2: Pt will communicate wants/needs using verbalizations or gestures x3 during session with max multimodal cues.  SLP Goal #3: Pt will repeat CV syllables with max phonemic/visual cues x10 during session.   SLP Evaluation Prior Functioning  Cognitive/Linguistic Baseline: Baseline deficits Baseline deficit details: memory from prior strokes Lives With: Spouse   Cognition  Overall Cognitive Status: Difficult to assess Arousal/Alertness: Awake/alert Orientation Level: Other (comment) (unable  to asses d/t pt garbled speech) Attention: Focused;Sustained Focused Attention: Appears intact Sustained Attention: Appears intact Memory:  (UTA) Awareness: Impaired Awareness Impairment: Emergent impairment Problem Solving: Impaired Problem Solving Impairment:  Functional basic    Comprehension  Auditory Comprehension Overall Auditory Comprehension: Impaired Yes/No Questions: Not tested Commands: Impaired One Step Basic Commands: 75-100% accurate Two Step Basic Commands: 75-100% accurate Visual Recognition/Discrimination Discrimination: Exceptions to [facility] Common Objects: Unable to indentify [employer] Drawings: Able in field of 3 (with repetiion) Reading Comprehension Reading Status: Within funtional limits    Expression Verbal Expression Overall Verbal Expression: Impaired Initiation: No impairment Automatic Speech: Counting;Name;Social Response (approximated with max cues. ) Level of Generative/Spontaneous Verbalization: Word Repetition: Impaired Level of Impairment: Word level Naming: Impairment Common Objects: Unable to indentify [employer] Drawings: Able in field of 3 (with repetiion) Verbal Errors: Neologisms Pragmatics: No impairment Written Expression Dominant Hand: Right Written Expression: Not tested   Oral / Motor Oral Motor/Sensory Function Overall Oral Motor/Sensory Function: Impaired Labial ROM: Reduced right Labial Symmetry: Abnormal symmetry right Labial Strength: Reduced Labial Sensation: Reduced Lingual ROM: Reduced right Lingual Symmetry: Abnormal symmetry right Lingual Strength: Reduced Lingual Sensation: Reduced Facial ROM: Reduced right Facial Symmetry: Right droop Facial Strength: Reduced Facial Sensation: Reduced Motor Speech Overall Motor Speech: Impaired Respiration: Within functional limits Phonation: Low vocal intensity Articulation: Impaired Level of Impairment: Word Intelligibility: Intelligibility reduced Word: 0-24% accurate Motor Speech Errors: Groping for words;Inconsistent   GO   Harlon Ditty, [place] CCC-SLP [phone]   Claudine Mouton 03/09/2013, 9:44 AM

## 2013-03-09 NOTE — Evaluation (Signed)
Occupational Therapy Evaluation Patient Details Name: Frank Weeks MRN: [id number] DOB: Mar 15, 1929 Today's Date: 03/09/2013 Time:  -     OT Assessment / Plan / Recommendation Clinical Impression  Pt admitted with dense R hemiplegia.  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  Pt has old infarcts and a significant cardiac hx.  He presents requiring +2 assist for all mobility with impaired balance in sitting and standing, no functional movement on the R, dependence in all ADL, R visual field deficit, and expressive aphasia.  Will follow acutely.  Recommend intensive rehab.  Wife can provide 24 hour supervision, but cannot physically assist pt.    OT Assessment  Patient needs continued OT Services    Follow Up Recommendations  CIR    Barriers to Discharge      Equipment Recommendations  3 in 1 bedside comode;Wheelchair (measurements OT);Wheelchair cushion (measurements OT)    Recommendations for Other Services Rehab consult  Frequency  Min 3X/week    Precautions / Restrictions Precautions Precautions: Fall Precaution Comments: expressive aphasia, swallow precautions   Pertinent Vitals/Pain No pain    ADL  Grooming: Wash/dry hands;Supervision/safety Where Assessed - Grooming: Supported sitting Upper Body Bathing: Maximal assistance Where Assessed - Upper Body Bathing: Supported sitting Lower Body Bathing: +1 Total assistance Where Assessed - Lower Body Bathing: Supported sitting;Supported sit to stand Upper Body Dressing: Maximal assistance Where Assessed - Upper Body Dressing: Supported sitting Lower Body Dressing: +1 Total assistance Where Assessed - Lower Body Dressing: Supported sitting;Supported sit to Pharmacist, hospital: +2 Total assistance Toilet Transfer: Patient Percentage: 30% Statistician Method: Stand pivot Toileting - Clothing Manipulation and Hygiene: +1 Total assistance Where Assessed - Glass blower/designer Manipulation and Hygiene: Standing Equipment Used: Gait  belt Transfers/Ambulation Related to ADLs: transfers with +2 total pt 30 % to L side with verbal cues and blocking of R knee, non ambulatory ADL Comments: Needs assessment of self feeding.    OT Diagnosis: Generalized weakness;Disturbance of vision;Hemiplegia dominant side  OT Problem List: Decreased strength;Impaired balance (sitting and/or standing);Decreased coordination;Decreased knowledge of use of DME or AE;Impaired sensation;Impaired tone;Impaired UE functional use OT Treatment Interventions: Self-care/ADL training;DME and/or AE instruction;Therapeutic activities;Neuromuscular education;Visual/perceptual remediation/compensation;Patient/family education;Balance training   OT Goals Acute Rehab OT Goals OT Goal Formulation: With patient Time For Goal Achievement: 03/23/13 Potential to Achieve Goals: Good ADL Goals Pt Will Perform Eating: with supervision;Supported;Sitting, chair ADL Goal: Eating - Progress: Goal set today Pt Will Perform Grooming: Sitting, chair;with set-up ADL Goal: Grooming - Progress: Goal set today Pt Will Transfer to Toilet: with mod assist;with DME;Stand pivot transfer ADL Goal: Toilet Transfer - Progress: Goal set today Miscellaneous OT Goals Miscellaneous OT Goal #1: Pt will scan R visual field to locate items on plate or tray during ADL without cues. OT Goal: Miscellaneous Goal #1 - Progress: Goal set today Miscellaneous OT Goal #2: Pt will sit with supervision at EOB in prep for ADL. OT Goal: Miscellaneous Goal #2 - Progress: Progressing toward goals Miscellaneous OT Goal #3: Pt/family will perform P/AAROM R UE independently. OT Goal: Miscellaneous Goal #3 - Progress: Goal set today Miscellaneous OT Goal #4: Pt will protect R UE during mobility/transfers with minimal reminders. OT Goal: Miscellaneous Goal #4 - Progress: Goal set today Miscellaneous OT Goal #5: Pt will perform bed mobility with min assist in prep for ADL at EOB. OT Goal: Miscellaneous Goal  #5 - Progress: Goal set today  Visit Information  Assistance Needed: +2    Subjective Data      Prior  Functioning     Home Living Lives With: Spouse Available Help at Discharge: Family;Available 24 hours/day (wife can only provide supervision) Type of Home: House Home Access: Stairs to enter [employer] of Steps: 1 Entrance Stairs-Rails: None Home Layout: Two level;Able to live on main level with bedroom/bathroom Bathroom Shower/Tub: Health visitor:  (comfort height) Home Adaptive Equipment: Walker - standard Prior Function Level of Independence: Independent Communication Communication: Expressive difficulties Dominant Hand: Right         Vision/Perception Vision - History Baseline Vision: Wears glasses all the time Vision - Assessment Vision Assessment: Vision tested Visual Fields: Right visual field deficit;Impaired - to be further tested in functional context   Cognition  Cognition Arousal/Alertness: Awake/alert Behavior During Therapy: WFL for tasks assessed/performed Overall Cognitive Status: Difficult to assess Difficult to assess due to: Impaired communication    Extremity/Trunk Assessment Right Upper Extremity Assessment RUE ROM/Strength/Tone: Deficits RUE ROM/Strength/Tone Deficits: No movement including involuntary.  Tone WFL. Shoulder subluxation not palpable.  Hand is maintained open. RUE Sensation: Deficits RUE Sensation Deficits: Not able to formally test due to aphasia, appears to have impaired proprioceptive awareness. RUE Coordination: Deficits RUE Coordination Deficits: no functional use Right Lower Extremity Assessment RLE ROM/Strength/Tone: Deficits RLE ROM/Strength/Tone Deficits: PROM WFL with increased extensor tone noted at ankle, minimal voluntary movement noted. RLE Sensation: Deficits RLE Sensation Deficits: decreased as compared to left Left Lower Extremity Assessment LLE ROM/Strength/Tone: Within  functional levels LLE Sensation: WFL - Light Touch     Mobility Bed Mobility Bed Mobility: Supine to Sit;Sitting - Scoot to Edge of Bed Supine to Sit: 1: +2 Total assist Supine to Sit: Patient Percentage: 40% Sitting - Scoot to Edge of Bed: 2: Max assist Details for Bed Mobility Assistance: assisted right leg off edge of bed and to lift trunk pt pulled up on therapist's hand with left UE Transfers Sit to Stand: From bed;1: +2 Total assist Sit to Stand: Patient Percentage: 40% Stand to Sit: To bed;To chair/3-in-1;1: +2 Total assist Stand to Sit: Patient Percentage: 40% Details for Transfer Assistance: assist to position right foot on floor and to increased base of support due to pt leaning right, mild pusher tendency     Exercise   Balance Balance Balance Assessed: Yes Static Sitting Balance Static Sitting - Balance Support: Feet supported;Left upper extremity supported Static Sitting - Level of Assistance: 4: Min assist;3: Mod assist Static Sitting - Comment/# of Minutes: pushing to right at times, able to correct about 50% of the time pulling over with left UE on bed rail Dynamic Sitting Balance Dynamic Sitting - Balance Support: Feet supported;Right upper extremity supported Dynamic Sitting - Level of Assistance: 4: Min assist;3: Mod assist Dynamic Sitting Balance - Compensations: reaching various directions with left UE Static Standing Balance Static Standing - Balance Support: Bilateral upper extremity supported;During functional activity Static Standing - Level of Assistance: 1: +2 Total assist Static Standing - Comment/# of Minutes: stood approx 2 minutes initially, then 1 minute during hygiene due to small bowel incontinence   End of Session OT - End of Session Patient left: in chair Nurse Communication: Mobility status  GO     Evern Bio 03/09/2013, 3:03 PM [phone]

## 2013-03-09 NOTE — Progress Notes (Signed)
Rehab Admissions Coordinator Note:  Patient was screened by Trish Mage for appropriateness for an Inpatient Acute Rehab Consult.  Noted ST recommending CIR.  Will await PT and OT evals and then decide if inpatient rehab consult is needed.   Trish Mage 03/09/2013, 1:59 PM  I can be reached at [phone].

## 2013-03-10 ENCOUNTER — Inpatient Hospital Stay (HOSPITAL_COMMUNITY): Payer: Medicare Other

## 2013-03-10 ENCOUNTER — Encounter (HOSPITAL_COMMUNITY): Payer: Medicare Other

## 2013-03-10 ENCOUNTER — Encounter (HOSPITAL_COMMUNITY): Payer: Self-pay

## 2013-03-10 ENCOUNTER — Other Ambulatory Visit (HOSPITAL_COMMUNITY): Payer: Medicare Other

## 2013-03-10 DIAGNOSIS — I4891 Unspecified atrial fibrillation: Secondary | ICD-10-CM

## 2013-03-10 DIAGNOSIS — I739 Peripheral vascular disease, unspecified: Secondary | ICD-10-CM

## 2013-03-10 DIAGNOSIS — G8190 Hemiplegia, unspecified affecting unspecified side: Secondary | ICD-10-CM

## 2013-03-10 DIAGNOSIS — I442 Atrioventricular block, complete: Secondary | ICD-10-CM

## 2013-03-10 DIAGNOSIS — I679 Cerebrovascular disease, unspecified: Secondary | ICD-10-CM

## 2013-03-10 DIAGNOSIS — I10 Essential (primary) hypertension: Secondary | ICD-10-CM

## 2013-03-10 LAB — BASIC METABOLIC PANEL
BUN: 12 mg/dL (ref 6–23)
Creatinine, Ser: 0.75 mg/dL (ref 0.50–1.35)
GFR calc non Af Amer: 83 mL/min — ABNORMAL LOW (ref >90)
Glucose, Bld: 107 mg/dL — ABNORMAL HIGH (ref 70–99)
Potassium: 3.9 mEq/L (ref 3.5–5.1)

## 2013-03-10 LAB — CBC
Hemoglobin: 13.1 g/dL (ref 13.0–17.0)
MCH: 32.3 pg (ref 26.0–34.0)
MCHC: 33.9 g/dL (ref 30.0–36.0)

## 2013-03-10 MED ORDER — HEPARIN (PORCINE) IN NACL 100-0.45 UNIT/ML-% IJ SOLN
1150.0000 [IU]/h | INTRAMUSCULAR | Status: DC
  Administered 2013-03-10: 1000 [IU]/h via INTRAVENOUS
  Administered 2013-03-11: 1150 [IU]/h via INTRAVENOUS
  Filled 2013-03-10 (×2): qty 250

## 2013-03-10 MED ORDER — IOHEXOL 350 MG/ML SOLN
80.0000 mL | Freq: Once | INTRAVENOUS | Status: AC | PRN
  Administered 2013-03-10: 80 mL via INTRAVENOUS

## 2013-03-10 MED ORDER — HEPARIN BOLUS VIA INFUSION
4000.0000 [IU] | Freq: Once | INTRAVENOUS | Status: DC
  Filled 2013-03-10: qty 4000

## 2013-03-10 NOTE — Consult Note (Signed)
Physical Medicine and Rehabilitation Consult Reason for Consult: CVA Referring Physician: [facility]   HPI: Frank Weeks is a 79 y.o. right-handed male with history of CAD/CABG, pacemaker, subarachnoid hemorrhage 50 years ago CVA. Admitted 03/08/2013 with fall reported by wife with right-sided weakness and slurred speech. Cranial CT showed developing low density left basal ganglia external capsule region consistent with acute infarct. Carotid Dopplers with no ICA stenosis. Echocardiogram with ejection fraction of 60% and normal systolic function. CT angiogram of the head showed a filling defect throughout the left M1 segment compatible with embolus causing acute infarct in left basal ganglia. Patient did not receive TPA. Neurology services placed on aspirin therapy for stroke prophylaxis as well as subcutaneous Lovenox for DVT prophylaxis. Patient is on a dysphagia 1 nectar thick liquid followed by speech therapy. Physical and occupational therapy evaluations completed 03/09/2013 recommendations made for physical medicine rehabilitation consult   Review of Systems  Unable to perform ROS  Past Medical History  Diagnosis Date  . Coronary artery disease     Prior CABG  . Chest pain, non-cardiac     Negative Myoview  . Pacemaker     Second degree heart block and bradycardia  . PVD (peripheral vascular disease)   . HTN (hypertension)   . Dyslipidemia   . Subarachnoid hemorrhage   . Cerebrovascular disease   . GERD (gastroesophageal reflux disease)   . Second degree AV block, Mobitz type II   . Heart block AV first degree   . SOB (shortness of breath)   . Stroke   . Glaucoma(365)   . Pleural effusion    Past Surgical History  Procedure Laterality Date  . Coronary artery bypass graft  03/18/2006  . Right carotid endarectomy  09/2004  . Left carotid to vertebral bypass    . Dual-chamber pacemaker implantation    . Thoracentesis and bronchoscopy     Family History  Problem Relation Age of  Onset  . Heart attack Mother   . Lung cancer Mother   . Heart attack Father   . Tremor     Social History:  reports that he quit smoking about 62 years ago. He does not have any smokeless tobacco history on file. He reports that he does not drink alcohol. His drug history is not on file. Allergies:  Allergies  Allergen Reactions  . Atorvastatin Other (See Comments)    Joints sore.    Medications Prior to Admission  Medication Sig Dispense Refill  . aspirin EC 81 MG tablet Take 1 tablet (81 mg total) by mouth daily.      Marland Kitchen co-enzyme Q-10 30 MG capsule Take 30 mg by mouth 2 (two) times daily.      . fish oil-omega-3 fatty acids 1000 MG capsule Take 2 g by mouth daily.        . nitroGLYCERIN (NITROSTAT) 0.4 MG SL tablet Place 0.4 mg under the tongue every 5 (five) minutes as needed for chest pain.      Marland Kitchen permethrin (ELIMITE) 5 % cream Apply 1 application topically once. Took first treatment and needs second treatment on Wednesday.      . ramipril (ALTACE) 10 MG capsule Take 10 mg by mouth daily.        . rosuvastatin (CRESTOR) 10 MG tablet Take 10 mg by mouth. Take 1 tablet by mouth three times a week.       . travoprost, benzalkonium, (TRAVATAN) 0.004 % ophthalmic solution Place 1 drop into both eyes at bedtime.  Home: Home Living Lives With: Spouse Available Help at Discharge: Family;Available 24 hours/day (wife can only provide supervision) Type of Home: House Home Access: Stairs to enter [employer] of Steps: 1 Entrance Stairs-Rails: None Home Layout: Two level;Able to live on main level with bedroom/bathroom Bathroom Shower/Tub: Health visitor:  (comfort height) Home Adaptive Equipment: Walker - standard  Functional History:   Functional Status:  Mobility: Bed Mobility Bed Mobility: Supine to Sit;Sitting - Scoot to Edge of Bed Supine to Sit: 1: +2 Total assist Supine to Sit: Patient Percentage: 40% Sitting - Scoot to Edge of Bed: 2:  Max assist Transfers Transfers: Sit to Stand;Stand to [employer] Transfers Sit to Stand: From bed;1: +2 Total assist Sit to Stand: Patient Percentage: 40% Stand to Sit: To bed;To chair/3-in-1;1: +2 Total assist Stand to Sit: Patient Percentage: 40% Stand Pivot Transfers: 1: +2 Total assist Stand Pivot Transfers: Patient Percentage: 30%      ADL: ADL Grooming: Wash/dry hands;Supervision/safety Where Assessed - Grooming: Supported sitting Upper Body Bathing: Maximal assistance Where Assessed - Upper Body Bathing: Supported sitting Lower Body Bathing: +1 Total assistance Where Assessed - Lower Body Bathing: Supported sitting;Supported sit to stand Upper Body Dressing: Maximal assistance Where Assessed - Upper Body Dressing: Supported sitting Lower Body Dressing: +1 Total assistance Where Assessed - Lower Body Dressing: Supported sitting;Supported sit to Pharmacist, hospital: +2 Total assistance Toilet Transfer Method: Stand pivot Equipment Used: Gait belt Transfers/Ambulation Related to ADLs: transfers with +2 total pt 30 % to L side with verbal cues and blocking of R knee, non ambulatory ADL Comments: Needs assessment of self feeding.  Cognition: Cognition Overall Cognitive Status: Difficult to assess Arousal/Alertness: Awake/alert Orientation Level: Other (comment) (unable to assess) Attention: Focused;Sustained Focused Attention: Appears intact Sustained Attention: Appears intact Memory:  (UTA) Awareness: Impaired Awareness Impairment: Emergent impairment Problem Solving: Impaired Problem Solving Impairment: Functional basic Cognition Arousal/Alertness: Awake/alert Behavior During Therapy: WFL for tasks assessed/performed Overall Cognitive Status: Difficult to assess Difficult to assess due to: Impaired communication  Blood pressure 143/64, pulse 78, temperature 98.2 F (36.8 C), temperature source Oral, resp. rate 18, height 5\' 8"  (1.727 m), weight 74.844 kg  (165 lb), SpO2 95.00%. Physical Exam  Vitals reviewed. HENT:  Head: Normocephalic.  Eyes:  Pupils reactive to light  Neck: Neck supple. No thyromegaly present.  Cardiovascular:  Cardiac rate control  Pulmonary/Chest: Effort normal and breath sounds normal. No respiratory distress.  Abdominal: Bowel sounds are normal. He exhibits no distension.  Neurological: He is alert.  Patient makes good eye contact with examiner. Alert. Right central 7, pocketing food in mouth, no awareness food was hanging off lip. Patient is aphasic. He did follow simple one-step commands with delay but fairly consistent. Has somewhat of a left gaze preference. Dense RUE 0/5 with no sense of pain. RLE is 1+ to 2/5 with HE and KE--0/5 distally. Senses pain in the right leg.   Skin: Skin is warm and dry.    Results for orders placed during the hospital encounter of 03/08/13 (from the past 24 hour(s))  GLUCOSE, CAPILLARY     Status: Abnormal   Collection Time    03/09/13 11:46 AM      Result Value Range   Glucose-Capillary 125 (*) 70 - 99 mg/dL   Comment 1 Notify RN    GLUCOSE, CAPILLARY     Status: Abnormal   Collection Time    03/09/13  3:34 PM      Result Value Range  Glucose-Capillary 138 (*) 70 - 99 mg/dL  BASIC METABOLIC PANEL     Status: Abnormal   Collection Time    03/10/13  4:40 AM      Result Value Range   Sodium 135  135 - 145 mEq/L   Potassium 3.9  3.5 - 5.1 mEq/L   Chloride 103  96 - 112 mEq/L   CO2 22  19 - 32 mEq/L   Glucose, Bld 107 (*) 70 - 99 mg/dL   BUN 12  6 - 23 mg/dL   Creatinine, Ser 1.61  0.50 - 1.35 mg/dL   Calcium 8.9  8.4 - 09.6 mg/dL   GFR calc non Af Amer 83 (*) >90 mL/min   GFR calc Af Amer >90  >90 mL/min  CBC     Status: Abnormal   Collection Time    03/10/13  4:40 AM      Result Value Range   WBC 9.7  4.0 - 10.5 K/uL   RBC 4.05 (*) 4.22 - 5.81 MIL/uL   Hemoglobin 13.1  13.0 - 17.0 g/dL   HCT 04.5 (*) 40.9 - 81.1 %   MCV 95.3  78.0 - 100.0 fL   MCH 32.3  26.0 -  34.0 pg   MCHC 33.9  30.0 - 36.0 g/dL   RDW 91.4  78.2 - 95.6 %   Platelets 176  150 - 400 K/uL   Dg Chest 2 View  03/09/2013   *RADIOLOGY REPORT*  Clinical Data: CVA.  CHEST - 2 VIEW  Comparison:  03/08/2013.  Findings: The heart is enlarged but stable.  Stable pacer wires. Persistent vascular congestion and interstitial edema along with a left pleural effusion and left lower lobe atelectasis.  IMPRESSION: Persistent cardiac enlargement, CHF, left pleural effusion and left lower lobe atelectasis.   Original Report Authenticated By: Rudie Meyer, M.D.   Ct Head Wo Contrast  03/09/2013   *RADIOLOGY REPORT*  Clinical Data: Right hemipareses.  Right facial weakness. Dysarthria.  CT HEAD WITHOUT CONTRAST  Technique:  Contiguous axial images were obtained from the base of the skull through the vertex without contrast.  Comparison: 03/08/2013  Findings: The patient has developed low density in the left basal ganglia and external capsule region consistent with acute infarction.  No mass effect or hemorrhage.  There are old cerebellar infarctions inferiorly on the right. There is an old right frontal parietal deep white matter infarction.  Chronic small vessel changes effect the white matter diffusely.  IMPRESSION: Developing low density in the left basal ganglia/external capsule region consistent with acute infarction.  No mass effect or hemorrhage.   Original Report Authenticated By: Paulina Fusi, M.D.   Ct Head Wo Contrast  03/08/2013   *RADIOLOGY REPORT*  Clinical Data: Acute onset right sided facial droop, altered speech  CT HEAD WITHOUT CONTRAST  Technique:  Contiguous axial images were obtained from the base of the skull through the vertex without contrast.  Comparison: None.  Findings:  There is asymmetric periventricular hypodensities involving the subcortical white matter about the right lateral ventricle extending to the subcortical surface of the right parietal lobe (images 24 and 25), suggestive of  infarct.  A lacunar infarct is seen within the left insular cortex (image 18).  Discrete areas of encephalomalacia compatible with prior infarcts are seen involving the right cerebellum (images six and seven).  Mild diffuse atrophy with centralized volume loss and commiserate ex vacuo dilatation of the ventricular system.  There is mild increased attenuation of the intracranial blood  pool with the bilateral MCAs appearing grossly symmetrically dense given obliquity.  Given extensive background parenchymal abnormalities, there is no CT evidence of acute large territory infarct. No definite intraparenchymal or extra-axial mass or hemorrhage.  Normal size and configuration of the ventricles and basilar cisterns.  No midline shift.  Limited visualization of the paranasal sinuses demonstrates minimal mucosal thickening within the left maxillary sinus.  The remaining paranasal sinuses and mastoid air cells appear normally aerated. Regional soft tissues appear normal.  No displaced calvarial fracture.  IMPRESSION:  1.  No definite acute large territory infarct.  No intraparenchymal or extra-axial hemorrhage. 2.  Prior infarcts involving the right subcortical parietal lobe and right cerebellum.  3.  Atrophy and advanced microvascular ischemic disease.  Above findings discussed with Dr. Leroy Kennedy at 09:39.   Original Report Authenticated By: Tacey Ruiz, MD   Dg Chest Portable 1 View  03/08/2013   *RADIOLOGY REPORT*  Clinical Data: Stroke  PORTABLE CHEST - 1 VIEW  Comparison: 03/24/2009  Findings: Cardiomegaly with suspected mild interstitial edema. Moderate left pleural effusion.  Associated left lower lobe opacity, possibly atelectasis.  Postsurgical changes related to prior CABG.  Left subclavian pacemaker.  IMPRESSION: Cardiomegaly with suspected mild interstitial edema.  Moderate left pleural effusion.  Associated left lower lobe opacity, possibly atelectasis.   Original Report Authenticated By: Charline Bills, M.D.    Dg Swallowing Func-speech Pathology  03/09/2013   Riley Nearing [place]blois, CCC-SLP     03/09/2013 11:36 AM Objective Swallowing Evaluation: Modified Barium Swallowing Study   Patient Details  Name: Frank Weeks MRN: [id number] Date of Birth: Sep 11, 1929  Today's Date: 03/09/2013 Time: 4098-1191 SLP Time Calculation (min): 29 min  Past Medical History:  Past Medical History  Diagnosis Date  . Coronary artery disease     Prior CABG  . Chest pain, non-cardiac     Negative Myoview  . Pacemaker     Second degree heart block and bradycardia  . PVD (peripheral vascular disease)   . HTN (hypertension)   . Dyslipidemia   . Subarachnoid hemorrhage   . Cerebrovascular disease   . GERD (gastroesophageal reflux disease)   . Second degree AV block, Mobitz type II   . Heart block AV first degree   . SOB (shortness of breath)   . Stroke   . Glaucoma(365)   . Pleural effusion    Past Surgical History:  Past Surgical History  Procedure Laterality Date  . Coronary artery bypass graft  03/18/2006  . Right carotid endarectomy  09/2004  . Left carotid to vertebral bypass    . Dual-chamber pacemaker implantation    . Thoracentesis and bronchoscopy     HPI:  79 y.o. male with acute onset right hemiparesis, right face  weakness, and dysarthria. Demosntrated overt evidence of  aspiration at bedside, MBS to objectively evaluate function.      Assessment / Plan / Recommendation Clinical Impression  Dysphagia Diagnosis: Moderate oral phase dysphagia;Moderate  pharyngeal phase dysphagia Clinical impression: Pt presents with a sensorimotor oral  dysphagia with right oral weakness and numbness and lingual  weakness leading to moderate oral residuals and anterior  spillage. With max assist to place bites in on the left and sweep  right buccal cavity, the pt can manage soft solids. Will attempt  in therapy.   Pharyngeal phase characterized primarily by sensory deficits  with decreased sensation for swallow trigger. Thin liquids are  aspirated before  the swallow with sensation of only significant  aspiration. Cough  is not sufficient to expel aspirate. Recommend  Dys 1 (puree) diet and nectar thick liquids via straw with full  supervision. SLP will follow for tolerance and therapeutic  intervention.     Treatment Recommendation  Therapy as outlined in treatment plan below    Diet Recommendation Dysphagia 1 (Puree);Nectar-thick liquid   Liquid Administration via: Straw Medication Administration: Crushed with puree Supervision: Full supervision/cueing for compensatory  strategies;Patient able to self feed Compensations: Slow rate;Small sips/bites;Check for  pocketing;Check for anterior loss Postural Changes and/or Swallow Maneuvers: Seated upright 90  degrees;Out of bed for meals    Other  Recommendations Recommended Consults: MBS Oral Care Recommendations: Oral care BID;Oral care before and  after PO (clear out mouth after PO) Other Recommendations: Order thickener from pharmacy   Follow Up Recommendations  Inpatient Rehab    Frequency and Duration min 2x/week  2 weeks   Pertinent Vitals/Pain NA    SLP Swallow Goals Patient will utilize recommended strategies during swallow to  increase swallowing safety with: Moderate cueing Swallow Study Goal #2 - Progress: Progressing toward goal   General HPI: 79 y.o. male with acute onset right hemiparesis,  right face weakness, and dysarthria. Demosntrated overt evidence  of aspiration at bedside, MBS to objectively evaluate function.  Type of Study: Modified Barium Swallowing Study Reason for Referral: Objectively evaluate swallowing function Diet Prior to this Study: NPO Temperature Spikes Noted: No Respiratory Status: Room air History of Recent Intubation: No Behavior/Cognition: Alert;Cooperative;Pleasant mood Oral Cavity - Dentition: Adequate natural dentition Oral Motor / Sensory Function: Impaired - see Bedside swallow  eval Self-Feeding Abilities: Able to feed self;Needs assist Patient Positioning: Upright in chair  Baseline Vocal Quality: Clear;Low vocal intensity Volitional Cough: Weak Volitional Swallow: Able to elicit Anatomy: Within functional limits Pharyngeal Secretions: Not observed secondary MBS    Reason for Referral Objectively evaluate swallowing function   Oral Phase Oral Preparation/Oral Phase Oral Phase: Impaired Oral - Nectar Oral - Nectar Cup: Right anterior bolus loss;Weak lingual  manipulation;Right pocketing in lateral sulci;Delayed oral  transit Oral - Nectar Straw: Delayed oral transit;Weak lingual  manipulation Oral - Thin Oral - Thin Cup: Right anterior bolus loss;Weak lingual  manipulation;Right pocketing in lateral sulci;Delayed oral  transit Oral - Solids Oral - Puree: Right anterior bolus loss;Weak lingual  manipulation;Right pocketing in lateral sulci;Delayed oral  transit Oral - Mechanical Soft: Right anterior bolus loss;Weak lingual  manipulation;Right pocketing in lateral sulci;Delayed oral  transit Oral - Pill: Delayed oral transit;Pocketing in anterior  sulcus;Reduced posterior propulsion   Pharyngeal Phase Pharyngeal Phase Pharyngeal Phase: Impaired Pharyngeal - Nectar Pharyngeal - Nectar Cup: Delayed swallow initiation;Premature  spillage to pyriform sinuses Pharyngeal - Nectar Straw: Delayed swallow initiation;Premature  spillage to pyriform sinuses Penetration/Aspiration details (nectar straw): Material does not  enter airway Pharyngeal - Thin Pharyngeal - Thin Cup: Delayed swallow initiation;Premature  spillage to pyriform sinuses;Penetration/Aspiration before  swallow;Significant aspiration (Amount) Penetration/Aspiration details (thin cup): Material enters  airway, passes BELOW cords without attempt by patient to eject  out (silent aspiration);Material enters airway, passes BELOW  cords and not ejected out despite cough attempt by patient (chin  tuck increases aspiration. ) Pharyngeal - Solids Pharyngeal - Puree: Delayed swallow initiation;Premature spillage  to valleculae Pharyngeal -  Mechanical Soft: Premature spillage to  valleculae;Delayed swallow initiation Pharyngeal - Pill: Not tested  Cervical Esophageal Phase    GO   Harlon Ditty, MA CCC-SLP [phone]            DeBlois, Riley Nearing 03/09/2013,  11:33 AM     Assessment/Plan: Diagnosis: left basal ganglia, external capsule infarct 1. Does the need for close, 24 hr/day medical supervision in concert with the patient's rehab needs make it unreasonable for this patient to be served in a less intensive setting? Yes 2. Co-Morbidities requiring supervision/potential complications: htn, cad, afib 3. Due to bladder management, bowel management, safety, skin/wound care, disease management, medication administration, pain management and patient education, does the patient require 24 hr/day rehab nursing? Yes 4. Does the patient require coordinated care of a physician, rehab nurse, PT (1-2 hrs/day, 5 days/week), OT (1-2 hrs/day, 5 days/week) and SLP (1-2 hrs/day, 5 days/week) to address physical and functional deficits in the context of the above medical diagnosis(es)? Yes Addressing deficits in the following areas: balance, endurance, locomotion, strength, transferring, bowel/bladder control, bathing, dressing, feeding, grooming, toileting, cognition, speech, language, swallowing and psychosocial support 5. Can the patient actively participate in an intensive therapy program of at least 3 hrs of therapy per day at least 5 days per week? Yes 6. The potential for patient to make measurable gains while on inpatient rehab is good 7. Anticipated functional outcomes upon discharge from inpatient rehab are min to mod assist with PT, mod assist with OT, mod assist with SLP. 8. Estimated rehab length of stay to reach the above functional goals is: 3 weeks 9. Does the patient have adequate social supports to accommodate these discharge functional goals? Potentially 10. Anticipated D/C setting: Home 11. Anticipated post D/C treatments: HH  therapy 12. Overall Rehab/Functional Prognosis: excellent  RECOMMENDATIONS: This patient's condition is appropriate for continued rehabilitative care in the following setting: CIR Patient has agreed to participate in recommended program. Potentially Note that insurance prior authorization may be required for reimbursement for recommended care.  Comment: Pt needs to be at a higher level to return home so that his wife can help manage him. Wife tells me he's determined to get home. I am not sure, however, how realistic his wife is in regard to functional expectations in the short and long term. Rehab RN to follow up.   Ranelle Oyster, MD, [place] [facility]     03/10/2013

## 2013-03-10 NOTE — Care Management Note (Unsigned)
    Page 1 of 1   03/10/2013     4:48:48 PM   CARE MANAGEMENT NOTE 03/10/2013  Patient:  AARIAN, GRIFFIE   Account Number:  [id number]  Date Initiated:  03/10/2013  Documentation initiated by:  GRAVES-BIGELOW,Yomira Flitton  Subjective/Objective Assessment:   Pt admitted for fall, and R sided weakness.     Action/Plan:   CM did provide pt's wife with a list for Personal Care Providers. MD called and stated CIR consult was placed. CM will continue to monitor for dispsosition needs.   Anticipated DC Date:  03/12/2013   Anticipated DC Plan:  IP REHAB FACILITY      DC Planning Services  CM consult      Choice offered to / List presented to:             Status of service:  In process, will continue to follow Medicare Important Message given?   (If response is "NO", the following Medicare IM given date fields will be blank) Date Medicare IM given:   Date Additional Medicare IM given:    Discharge Disposition:    Per UR Regulation:  Reviewed for med. necessity/level of care/duration of stay  If discussed at Long Length of Stay Meetings, dates discussed:    Comments:

## 2013-03-10 NOTE — Progress Notes (Signed)
Speech Language Pathology Treatment Patient Details Name: Frank Weeks MRN: [id number] DOB: [birth year] Today's Date: 03/10/2013 Time: 9629-5284 SLP Time Calculation (min): 42 min  Assessment / Plan / Recommendation Clinical Impression  Treatment session focused on functional communication, automatic speech tasks and dysphagia. Pt observed to consume soft chewable solids with minimal right buccal residual, min verbal cues needed to increase pt awareness. Pt was observed to have impulsive intake of liquids if not given close supervision. Pt with subtle signs of aspiration with overlarge sips. SLP discussed importance of supervision and assistance with RN and family. SLP provided max phonemic cues for labial closure to approximate bilabial consonants. Attempted utilizing music and melodic intonation techniques to encourage automatic speech. Pt consistently demosntrated unintelligible vowel sounds without articulatory contacts. Pt with less than 50% accuracy with biographical/basic Y/N questions. WIll continue efforts, upgrade diet to dys 2/nectar.     SLP Plan  Continue with current plan of care    Pertinent Vitals/Pain NA  SLP Goals  SLP Goals Potential to Achieve Goals: Good Progress/Goals/Alternative treatment plan discussed with pt/caregiver and they: Agree SLP Goal #1: Pt will correctly respond to basic enviornmental/biographical Y/N questions with min verbal cues and 80% accuracy SLP Goal #1 - Progress: Progressing toward goal SLP Goal #2: Pt will communicate wants/needs using verbalizations or gestures x3 during session with max multimodal cues.  SLP Goal #2 - Progress: Progressing toward goal SLP Goal #3: Pt will repeat CV syllables with max phonemic/visual cues x10 during session.  SLP Goal #3 - Progress: Progressing toward goal  General Temperature Spikes Noted: No Respiratory Status: Room air Behavior/Cognition: Alert;Cooperative;Pleasant mood Oral Cavity - Dentition: Adequate  natural dentition Patient Positioning: Upright in bed  Oral Cavity - Oral Hygiene     Treatment Treatment focused on: Aphasia;Patient/family/caregiver education Family/Caregiver Educated: wife Nicole Cella, daughter, Mirian Mo, [place] CCC-SLP [phone]  Claudine Mouton 03/10/2013, 4:14 PM

## 2013-03-10 NOTE — Progress Notes (Signed)
Patient ID: Frank Weeks, male   DOB: [birth year], 79 y.o.   MRN: [id number] Frank Weeks was scheduled for a Adenosine Cardiolite, Carotid Ultrasound, and Echo today.He was a no show today because he is in the hospital due to CVA. Dr. Rollene Rotunda and Avie Arenas, RN notified. Irean Hong, RN.

## 2013-03-10 NOTE — Progress Notes (Signed)
Rehab Consult Service  Pt gone for CT. Will follow up for consultation as time permits me.           Ranelle Oyster, MD, [facility] Physical Medicine & Rehabilitation

## 2013-03-10 NOTE — Progress Notes (Addendum)
ANTICOAGULATION CONSULT NOTE - Initial Consult  Pharmacy Consult for Heparin  Indication: atrial fibrillation  Allergies  Allergen Reactions  . Atorvastatin Other (See Comments)    Joints sore.     Patient Measurements: Height: 5\' 8"  (172.7 cm) Weight: 165 lb (74.844 kg) (from previous admission) IBW/kg (Calculated) : 68.4 Heparin Dosing Weight: 74.8 kg  Vital Signs: Temp: 97.9 F (36.6 C) (05/27 1313) Temp src: Oral (05/27 0815) BP: 146/61 mmHg (05/27 1313) Pulse Rate: 68 (05/27 1313)  Labs:  Recent Labs  03/08/13 0925 03/08/13 0931 03/10/13 0440  HGB 14.1 15.0 13.1  HCT 41.6 44.0 38.6*  PLT 185  --  176  APTT 28  --   --   LABPROT 13.3  --   --   INR 1.02  --   --   CREATININE 0.96 0.90 0.75  TROPONINI <0.30  --   --     Estimated Creatinine Clearance: 68.9 ml/min (by C-G formula based on Cr of 0.75).   Medical History: Past Medical History  Diagnosis Date  . Coronary artery disease     Prior CABG  . Chest pain, non-cardiac     Negative Myoview  . Pacemaker     Second degree heart block and bradycardia  . PVD (peripheral vascular disease)   . HTN (hypertension)   . Dyslipidemia   . Subarachnoid hemorrhage   . Cerebrovascular disease   . GERD (gastroesophageal reflux disease)   . Second degree AV block, Mobitz type II   . Heart block AV first degree   . SOB (shortness of breath)   . Stroke   . Glaucoma(365)   . Pleural effusion     Medications:  Prescriptions prior to admission  Medication Sig Dispense Refill  . aspirin EC 81 MG tablet Take 1 tablet (81 mg total) by mouth daily.      Marland Kitchen co-enzyme Q-10 30 MG capsule Take 30 mg by mouth 2 (two) times daily.      . fish oil-omega-3 fatty acids 1000 MG capsule Take 2 g by mouth daily.        . nitroGLYCERIN (NITROSTAT) 0.4 MG SL tablet Place 0.4 mg under the tongue every 5 (five) minutes as needed for chest pain.      Marland Kitchen permethrin (ELIMITE) 5 % cream Apply 1 application topically once. Took first  treatment and needs second treatment on Wednesday.      . ramipril (ALTACE) 10 MG capsule Take 10 mg by mouth daily.        . rosuvastatin (CRESTOR) 10 MG tablet Take 10 mg by mouth. Take 1 tablet by mouth three times a week.       . travoprost, benzalkonium, (TRAVATAN) 0.004 % ophthalmic solution Place 1 drop into both eyes at bedtime.         Scheduled:  . antiseptic oral rinse  15 mL Mouth Rinse q12n4p  . aspirin  325 mg Oral Daily  . chlorhexidine  15 mL Mouth Rinse BID  . rosuvastatin  10 mg Oral q1800  . Travoprost (BAK Free)  1 drop Both Eyes QHS    Assessment: 79 y.o male with left basal ganglia infarct. Infarct felt to be embolic secondary to known atrial fibrillation.  Now to start on IV heparin for Afib.  Not on coumadin or other anticoagulation PTA.  In past, patient had refused coumadin and noted to have difficulty gait, balance and falls.  Inpatient lovenox 40mg  SQ q24h for VTE prophylaxis last given on 03/09/13,  now discontinued.    Baseline INR 1.02 on 03/08/13 CBC as noted above.  No bleeding reported.     Goal of Therapy:  Heparin level = 0.3-0.5 units/ml Monitor platelets by anticoagulation protocol: Yes   Plan:  No bolus.  Start heparin IV drip @ 1000 units/hr.  Check heparin level 8 hours after heparin started.  Then daily AM heparin level and CBC while on IV heparin.  Noah Delaine, RPh Clinical Pharmacist Pager: [phone] 03/10/2013,3:43 PM 03/10/2013 16: 04 PM

## 2013-03-10 NOTE — Progress Notes (Signed)
Stroke Team Progress Note  HISTORY Frank Weeks is an 79 y.o. male with a past medical history significant for hypertension, hyperlipidemia, CAD s/p CABG, s/p pacemaker placement, stroke without residual deficit, aneurysmal SAH at age 59 years old, brought to [facility] by medics 03/08/2013 as a code stroke due to acute onset right hemiparesis, right facial weakness, and dysarthria.  He was last seen well at 8:15 am. He walked to the mailbox and fell. Ambulance was called and they found him dysarthric with dense right hemiparesis and right face weakness.  He has no residual deficits from prior stroke and SAH.  NIHSS 22. CT brain showed no acute intracranial abnormality.  Patient was not a TPA candidate secondary to history of aneurysmal SAH. He was admitted for further evaluation and treatment.  SUBJECTIVE No family at bedside this am. Patient on his was to radiology.  OBJECTIVE Most recent Vital Signs: Filed Vitals:   03/09/13 2100 03/10/13 0025 03/10/13 0415 03/10/13 0815  BP:  117/69 143/64   Pulse: 82 69 78   Temp:  97.3 F (36.3 C) 97.4 F (36.3 C) 98.2 F (36.8 C)  TempSrc:  Axillary Oral Oral  Resp:  20 18   Height: 5\' 8"  (1.727 m)     Weight: 74.844 kg (165 lb)     SpO2:  97% 95%    CBG (last 3)   Recent Labs  03/09/13 0733 03/09/13 1146 03/09/13 1534  GLUCAP 110* 125* 138*   IV Fluid Intake:   . sodium chloride 50 mL/hr at 03/09/13 2100   MEDICATIONS  . antiseptic oral rinse  15 mL Mouth Rinse q12n4p  . aspirin  325 mg Oral Daily  . chlorhexidine  15 mL Mouth Rinse BID  . enoxaparin (LOVENOX) injection  40 mg Subcutaneous Q24H  . rosuvastatin  10 mg Oral q1800  . Travoprost (BAK Free)  1 drop Both Eyes QHS   PRN:  RESOURCE THICKENUP CLEAR, senna-docusate  Diet:  Dysphagia 1 nectar thick Activity:  Up with assistance  DVT Prophylaxis:  Lovenox 40 mg sq daily   CLINICALLY SIGNIFICANT STUDIES Basic Metabolic Panel:   Recent Labs Lab 03/08/13 0925  03/08/13 0931 03/10/13 0440  NA 137 142 135  K 4.5 4.5 3.9  CL 102 106 103  CO2 25  --  22  GLUCOSE 100* 98 107*  BUN 15 17 12   CREATININE 0.96 0.90 0.75  CALCIUM 9.5  --  8.9   Liver Function Tests:   Recent Labs Lab 03/08/13 0925  AST 21  ALT 18  ALKPHOS 93  BILITOT 0.4  PROT 8.5*  ALBUMIN 3.6   CBC:   Recent Labs Lab 03/08/13 0925 03/08/13 0931 03/10/13 0440  WBC 9.9  --  9.7  NEUTROABS 7.2  --   --   HGB 14.1 15.0 13.1  HCT 41.6 44.0 38.6*  MCV 96.7  --  95.3  PLT 185  --  176   Coagulation:   Recent Labs Lab 03/08/13 0925  LABPROT 13.3  INR 1.02   Cardiac Enzymes:   Recent Labs Lab 03/08/13 0925  TROPONINI <0.30   Urinalysis: No results found for this basename: COLORURINE, APPERANCEUR, LABSPEC, PHURINE, GLUCOSEU, HGBUR, BILIRUBINUR, KETONESUR, PROTEINUR, UROBILINOGEN, NITRITE, LEUKOCYTESUR,  in the last 168 hours Lipid Panel    Component Value Date/Time   CHOL 137 03/09/2013 0055   TRIG 130 03/09/2013 0055   HDL 28* 03/09/2013 0055   CHOLHDL 4.9 03/09/2013 0055   VLDL 26 03/09/2013 0055  LDLCALC 83 03/09/2013 0055   HgbA1C  Lab Results  Component Value Date   HGBA1C 6.1* 03/09/2013    Urine Drug Screen:   No results found for this basename: labopia,  cocainscrnur,  labbenz,  amphetmu,  thcu,  labbarb    Alcohol Level:   Recent Labs Lab 03/08/13 0925  ETH <11   CT of the brain   03/09/2013   Developing low density in the left basal ganglia/external capsule region consistent with acute infarction.  No mass effect or hemorrhage.    03/08/2013    1.  No definite acute large territory infarct.  No intraparenchymal or extra-axial hemorrhage. 2.  Prior infarcts involving the right subcortical parietal lobe and right cerebellum.  3.  Atrophy and advanced microvascular ischemic disease.    CT angio of the head   MRI/A of the brain  pacemaker  2D Echocardiogram  EF 55-60% with no source of embolus.   Carotid Doppler  No evidence of  hemodynamically significant internal carotid artery stenosis. Vertebral artery flow is antegrade.   CXR   03/09/2013 Persistent cardiac enlargement, CHF, left pleural effusion and left lower lobe atelectasis. 03/08/2013  Cardiomegaly with suspected mild interstitial edema.  Moderate left pleural effusion.  Associated left lower lobe opacity, possibly atelectasis.  EKG  normal sinus rhythm.   Therapy Recommendations CIR  Physical Exam   GENERAL EXAM: Patient is in no distress  CARDIOVASCULAR: Regular rate and rhythm, no murmurs, no carotid bruits  NEUROLOGIC: MENTAL STATUS: awake, alert, EXPRESSIVE APHASIA; FOLLOWS SIMPLE COMMANDS. CANNOT NAME. NO INTELLIGIBLE SPEECH.  CRANIAL NERVE: pupils equal and reactive to light, visual fields full to confrontation, extraocular muscles intact, no nystagmus, facial sensation symm, DECR RIGHT LOWER FACIAL STRENGTH. Uvula midline, shoulder shrug ABSENT ON RIGHT. Tongue DEVIATES TO RIGHT. MOTOR: RUE 0, RLE 1-2/5.  SENSORY: normal and symmetric to light touch COORDINATION: NOT FOLLOWING COMMANDS. REFLEXES: deep tendon reflexes present and symmetric; RIGHT TOE UPGOING.  ASSESSMENT Mr. Frank Weeks is a 79 y.o. male presenting with acute onset right hemiparesis, right facial weakness, and dysarthria. Imaging confirms a left basal ganglia infarct. Infarct felt to be embolic secondary to known atrial fibrillation.  On aspirin 81 mg orally every day prior to admission. Now on aspirin 325 mg orally every day for secondary stroke prevention. Patient with resultant dysphagia, left hemiparesis. Work up underway.   Hx paroxsymal atrial fibrillation. Dr. Graciela Husbands had recommended though patient refused coumadin,  as well as had reported difficulty gait, balance and falls Hypertension Hyperlipidemia, LDL 83, on crestor 10 PTA, now on crestor 10 mg daily, goal LDL < 100 (< 70 for diabetics) Hx SAH in 1964 (reported as aneurysmal, but no craniotomy or clipping, resolved  spontaneously per wife) CAD - CABG 2007 R CEA Pacemaker - due to second degree heart block and bradycardia PVD Hx stroke at age 72, affecting his memory Hx "small strokes" with resultant loss of vision  Hospital day # 2  TREATMENT/PLAN  Continue aspirin 325 mg orally every day for secondary stroke prevention. Consider anticoagulation given history of paroxysmal atrial fibrillation. Recommend discussion with cardiology (prefer NOAC)  Change CT head to CT angio head in order to look at vasculature  Rehab consult placed  Ok to transfer to 4N from stroke standpoint  Annie Main, MSN, RN, ANVP-BC, ANP-BC, GNP-BC [facility] Pager: [phone] 03/10/2013 8:51 AM  I have personally examined this patient, reviewed chart, developed plan of care and agree with above. Delia Heady, MD

## 2013-03-10 NOTE — Progress Notes (Signed)
UR COMPLETED  

## 2013-03-10 NOTE — Progress Notes (Signed)
Physical Therapy Treatment Patient Details Name: Frank Weeks MRN: [id number] DOB: 1929-02-14 Today's Date: 03/10/2013 Time: 1133-1208 PT Time Calculation (min): 35 min  PT Assessment / Plan / Recommendation Comments on Treatment Session  Patient progressing with activity and able to initiate sit to stand with right LE today.  Still with poor sensory orientation to upright, but able to sit briefly with supervision and cues for correcting loss of COM over BOS.  Will greatly benefit from CIR level therapies at d/c.    Follow Up Recommendations  CIR     Does the patient have the potential to tolerate intense rehabilitation   Yes  Barriers to Discharge  Caregiver can give supervision level assist only      Equipment Recommendations  Other (comment) (TBA)       Frequency Min 4X/week   Plan Discharge plan remains appropriate    Precautions / Restrictions Precautions Precautions: Fall Precaution Comments: expressive aphasia, swallow precautions   Pertinent Vitals/Pain No pain complaints    Mobility  Bed Mobility Bed Mobility: Rolling Left;Left Sidelying to Sit Rolling Left: 3: Mod assist Left Sidelying to Sit: 1: +2 Total assist Left Sidelying to Sit: Patient Percentage: 40% Sitting - Scoot to Edge of Bed: 2: Max assist Details for Bed Mobility Assistance: cues to turn head to left and to lift with left elbow in left sidelying and to initate with lifting head. Transfers Sit to Stand: From bed;1: +2 Total assist Sit to Stand: Patient Percentage: 50% Stand to Sit: To bed;To chair/3-in-1;1: +2 Total assist Stand to Sit: Patient Percentage: 40% Stand Pivot Transfers: 1: +2 Total assist Stand Pivot Transfers: Patient Percentage: 40% Details for Transfer Assistance: able to push throught right LE to assist with standing, cues for keeping feet apart and to lean to left due to tends to lean to right    Exercises General Exercises - Upper Extremity Shoulder Flexion: PROM;Right;5  reps;Supine Elbow Flexion: PROM;Right;5 reps;Supine Elbow Extension: PROM;Right;5 reps;Supine Wrist Flexion: PROM;Right;5 reps;Supine Wrist Extension: PROM;Right;5 reps;Supine Digit Composite Flexion: PROM;Right;5 reps;Supine Composite Extension: PROM;5 reps;Supine General Exercises - Lower Extremity Ankle Circles/Pumps: PROM;5 reps;Right;Supine Heel Slides: PROM;Right;10 reps;Supine Other Exercises Other Exercises: supine right hip and knee extension into resistance x 5 reps     PT Goals Acute Rehab PT Goals Pt will go Supine/Side to Sit: with supervision PT Goal: Supine/Side to Sit - Progress: Progressing toward goal Pt will Sit at [facility] of Bed: with modified independence;with unilateral upper extremity support PT Goal: Sit at Edge Of Bed - Progress: Progressing toward goal Pt will go Sit to Stand: with min assist PT Goal: Sit to Stand - Progress: Progressing toward goal Pt will Transfer Bed to Chair/Chair to Bed: with mod assist PT Transfer Goal: Bed to Chair/Chair to Bed - Progress: Progressing toward goal Pt will Stand: with min assist;3 - 5 min;with unilateral upper extremity support PT Goal: Stand - Progress: Progressing toward goal  Visit Information  Last PT Received On: 03/10/13    Subjective Data  Subjective: Mumbles to communicate needs; able to say "Hi"   Cognition  Cognition Arousal/Alertness: Awake/alert Behavior During Therapy: WFL for tasks assessed/performed Overall Cognitive Status: Difficult to assess Difficult to assess due to: Impaired communication    Balance  Dynamic Sitting Balance Dynamic Sitting - Balance Support: Feet supported;No upper extremity supported;Left upper extremity supported Dynamic Sitting - Level of Assistance: 5: Stand by assistance;4: Min assist;3: Mod assist Dynamic Sitting Balance - Compensations: varying level of support while pt leaning all directions  initally provided increased support to feel moving all directions, then  backed off with cues to correct loss of balance posterior and to right Dynamic Standing Balance Dynamic Standing - Balance Support: Left upper extremity supported Dynamic Standing - Level of Assistance: 1: +2 Total assist Dynamic Standing - Balance Activities: Lateral lean/weight shifting Dynamic Standing - Comments: stood approx 50 seconds with lateral weight shifts and assist to block right knee and hip cues to look up for improved posture, facilitation through trunk for lateral pelvic tilts  End of Session PT - End of Session Equipment Utilized During Treatment: Gait belt Activity Tolerance: Patient tolerated treatment well Patient left: in chair;with family/visitor present   GP     [facility] 03/10/2013, 1:43 PM Sheran Lawless, PT [phone] 03/10/2013

## 2013-03-10 NOTE — Progress Notes (Addendum)
Report called to receiving nurse on 4 [place].  Patient without complaints. Tried to notify wife via cell and home phone, unable to reach. Left message on both phones, of patient being transferred.

## 2013-03-10 NOTE — Progress Notes (Addendum)
[facility] Progress Note [place] TEAM 1 - Stepdown/ICU TEAM   Frank Weeks ZOX:[id number] DOB: 10-15-1929 DOA: 03/08/2013 PCP: Ezequiel Kayser, MD  Brief narrative: 79 y.o. male w/ hx of CAD, CABG, pacemaker, aneurysmal SAH 50 years ago, CVA was walking to the mailbox when he fell.  His wife saw him fall and called 911. EMS arrived and noted that he had a dense R hemiparesis with dysarthria.  He was brought to [facility] as code stroke, was not given TPA due to h/o SAH.  Initial CT head was negative for acute changes.  Assessment/Plan:  CVA - suspect L basal ganglia/external capsule - was on ASA 81 mg at home - now on aspirin 325 for secondary stroke prevention  - Neuro suggests considering anticoagulation with history of paroxysmal atrial fibrillation   - Have started Heparin after discussion with wife- discussed with Dr Daleen Squibb- he states that pt had been adamant about not taking Coumadin in the past- at this time he recommends we wait until he is more oriented before calling cardiology consult- Dr Hochrein's noted from 5/14 mentions "He has not been treated with anticoagulation because of difficulty gait, balance and falls."  - at this point it is difficult to tell if pt will ever ambulate again and if this is the case, then he should be anticoagulated.   - cleared for D1 nectar diet per SLP and tolerated well today - CIR eval ordered as recommended per PT- wife is concerned about his care when he goes home- apparently does not have medicare benefits to pay for SNF- have asked social services to give info on private sittes  CAD s/p CABG 2007 cotninue ASA/Statin  PVD s/p R CEA No signif stenosis noted via dopplers  H/o Parox afib-  -followed by Dr.Klein  -was not on anticoagulation, though wife reports Dr. Graciela Husbands had suggested coumadin previously and pt himself decided against it - -see notes above under "CVA"  HTN:  Stable - cont to hold lisinopril for now to avoid potential  relative hypotension   S/p pacemaker due to second degree heart block and bradycardia   HLD On oral agent   Scabies Was diagnoses by Dr. Margo Aye (Dermatology) as an outpt - is due a final dose of permetherin cream on Wednesday (wife will bring from home) - wife has undergone empiric tx as well   Code Status: NO CODE BLUE Family Communication: spoke to wife at bedside Disposition Plan: transfer to tele bed on 4N  Consultants: Stroke Team   Procedures: 5/25 - carotid dopplers - no significant ICA stenosis - right CEA appears patent - vertebral artery flow is antegrade 5/26 - TTE - pending   Antibiotics: none  DVT prophylaxis: lovenox  HPI/Subjective: Pt is alert and interactive, though dysarthric. Nods or shakes his head in response to questions but at times has inaccurate answers.     Objective: Blood pressure 146/61, pulse 68, temperature 99 F (37.2 C), temperature source Oral, resp. rate 17, height 5\' 8"  (1.727 m), weight 74.844 kg (165 lb), SpO2 95.00%.  Intake/Output Summary (Last 24 hours) at 03/10/13 1805 Last data filed at 03/10/13 1614  Gross per 24 hour  Intake    900 ml  Output   1700 ml  Net   -800 ml   Exam: General: No acute respiratory distress Lungs: Clear to auscultation bilaterally without wheezes or crackles Cardiovascular: Regular rate and rhythm w/ 2/6 holosystolic M Abdomen: Nontender, nondistended, soft, bowel sounds positive, no rebound, no ascites, no  appreciable mass Extremities: No significant cyanosis, clubbing, or edema bilateral lower extremities Neuro: 0/5 in right upper and 1/5 lower extremity, expressive aphasia, follows some commands (squeezes left hand)  Data Reviewed: Basic Metabolic Panel:  Recent Labs Lab 03/08/13 0925 03/08/13 0931 03/10/13 0440  NA 137 142 135  K 4.5 4.5 3.9  CL 102 106 103  CO2 25  --  22  GLUCOSE 100* 98 107*  BUN 15 17 12   CREATININE 0.96 0.90 0.75  CALCIUM 9.5  --  8.9   Liver Function  Tests:  Recent Labs Lab 03/08/13 0925  AST 21  ALT 18  ALKPHOS 93  BILITOT 0.4  PROT 8.5*  ALBUMIN 3.6   CBC:  Recent Labs Lab 03/08/13 0925 03/08/13 0931 03/10/13 0440  WBC 9.9  --  9.7  NEUTROABS 7.2  --   --   HGB 14.1 15.0 13.1  HCT 41.6 44.0 38.6*  MCV 96.7  --  95.3  PLT 185  --  176   Cardiac Enzymes:  Recent Labs Lab 03/08/13 0925  TROPONINI <0.30   CBG:  Recent Labs Lab 03/08/13 0950 03/09/13 0733 03/09/13 1146 03/09/13 1534  GLUCAP 93 110* 125* 138*    Recent Results (from the past 240 hour(s))  MRSA PCR SCREENING     Status: None   Collection Time    03/08/13  3:13 PM      Result Value Range Status   MRSA by PCR NEGATIVE  NEGATIVE Final   Comment:            The GeneXpert MRSA Assay (FDA     approved for NASAL specimens     only), is one component of a     comprehensive MRSA colonization     surveillance program. It is not     intended to diagnose MRSA     infection nor to guide or     monitor treatment for     MRSA infections.     Studies:  Recent x-ray studies have been reviewed in detail by the Attending Physician  Scheduled Meds:  Scheduled Meds: . antiseptic oral rinse  15 mL Mouth Rinse q12n4p  . aspirin  325 mg Oral Daily  . chlorhexidine  15 mL Mouth Rinse BID  . rosuvastatin  10 mg Oral q1800  . Travoprost (BAK Free)  1 drop Both Eyes QHS   Time spent on care of this patient:   Finley Dinkel, MD [phone]  [facility] Office  [phone] Pager - Text Page per Amion as per below:  On-Call/Text Page:      [email or web address]      password [email or web address]  If 7PM-7AM, please contact night-coverage [email or web address] Password [email or web address] 03/10/2013, 6:05 PM   LOS: 2 days

## 2013-03-11 ENCOUNTER — Encounter (HOSPITAL_COMMUNITY): Payer: Self-pay | Admitting: Nurse Practitioner

## 2013-03-11 ENCOUNTER — Inpatient Hospital Stay (HOSPITAL_COMMUNITY): Payer: Medicare Other

## 2013-03-11 DIAGNOSIS — I6789 Other cerebrovascular disease: Secondary | ICD-10-CM

## 2013-03-11 DIAGNOSIS — I6330 Cerebral infarction due to thrombosis of unspecified cerebral artery: Secondary | ICD-10-CM

## 2013-03-11 DIAGNOSIS — I4892 Unspecified atrial flutter: Secondary | ICD-10-CM

## 2013-03-11 DIAGNOSIS — R1310 Dysphagia, unspecified: Secondary | ICD-10-CM

## 2013-03-11 LAB — CBC
HCT: 38.6 % — ABNORMAL LOW (ref 39.0–52.0)
Hemoglobin: 13 g/dL (ref 13.0–17.0)
MCH: 31.9 pg (ref 26.0–34.0)
MCHC: 33.7 g/dL (ref 30.0–36.0)

## 2013-03-11 LAB — HEPARIN LEVEL (UNFRACTIONATED): Heparin Unfractionated: 0.24 IU/mL — ABNORMAL LOW (ref 0.30–0.70)

## 2013-03-11 MED ORDER — APIXABAN 5 MG PO TABS
5.0000 mg | ORAL_TABLET | Freq: Two times a day (BID) | ORAL | Status: DC
  Administered 2013-03-11 – 2013-03-13 (×5): 5 mg via ORAL
  Filled 2013-03-11 (×6): qty 1

## 2013-03-11 MED ORDER — PNEUMOCOCCAL VAC POLYVALENT 25 MCG/0.5ML IJ INJ
0.5000 mL | INJECTION | INTRAMUSCULAR | Status: AC
  Administered 2013-03-12: 0.5 mL via INTRAMUSCULAR
  Filled 2013-03-11: qty 0.5

## 2013-03-11 NOTE — Progress Notes (Signed)
Stroke Team Progress Note  HISTORY Frank Weeks is an 79 y.o. male with a past medical history significant for hypertension, hyperlipidemia, CAD s/p CABG, s/p pacemaker placement, stroke without residual deficit, aneurysmal SAH at age 23 years old, brought to [facility] by medics 03/08/2013 as a code stroke due to acute onset right hemiparesis, right facial weakness, and dysarthria.  He was last seen well at 8:15 am. He walked to the mailbox and fell. Ambulance was called and they found him dysarthric with dense right hemiparesis and right face weakness.  He has no residual deficits from prior stroke and SAH.  NIHSS 22. CT brain showed no acute intracranial abnormality.  Patient was not a TPA candidate secondary to history of aneurysmal SAH. He was admitted for further evaluation and treatment.  SUBJECTIVE Wife at bedside this am.   OBJECTIVE Most recent Vital Signs: Filed Vitals:   03/10/13 2102 03/11/13 0244 03/11/13 0613 03/11/13 0927  BP: 146/74 128/65 121/65 120/61  Pulse: 67 72 67 71  Temp: 98.3 F (36.8 C) 97.4 F (36.3 C) 97.9 F (36.6 C) 98.3 F (36.8 C)  TempSrc: Oral Oral Oral Oral  Resp: 18 16 18 18   Height:      Weight:      SpO2: 97% 99% 94% 97%   CBG (last 3)   Recent Labs  03/09/13 0733 03/09/13 1146 03/09/13 1534  GLUCAP 110* 125* 138*   IV Fluid Intake:   . sodium chloride 50 mL/hr at 03/09/13 2100  . heparin 1,150 Units/hr (03/11/13 0243)   MEDICATIONS  . antiseptic oral rinse  15 mL Mouth Rinse q12n4p  . aspirin  325 mg Oral Daily  . chlorhexidine  15 mL Mouth Rinse BID  . [START ON 03/12/2013] pneumococcal 23 valent vaccine  0.5 mL Intramuscular Tomorrow-1000  . rosuvastatin  10 mg Oral q1800  . Travoprost (BAK Free)  1 drop Both Eyes QHS   PRN:  RESOURCE THICKENUP CLEAR, senna-docusate  Diet:  Dysphagia 1 nectar thick Activity:  Up with assistance  DVT Prophylaxis:  Lovenox 40 mg sq daily   CLINICALLY SIGNIFICANT STUDIES Basic Metabolic Panel:    Recent Labs Lab 03/08/13 0925 03/08/13 0931 03/10/13 0440  NA 137 142 135  K 4.5 4.5 3.9  CL 102 106 103  CO2 25  --  22  GLUCOSE 100* 98 107*  BUN 15 17 12   CREATININE 0.96 0.90 0.75  CALCIUM 9.5  --  8.9   Liver Function Tests:   Recent Labs Lab 03/08/13 0925  AST 21  ALT 18  ALKPHOS 93  BILITOT 0.4  PROT 8.5*  ALBUMIN 3.6   CBC:   Recent Labs Lab 03/08/13 0925  03/10/13 0440 03/11/13 0600  WBC 9.9  --  9.7 9.7  NEUTROABS 7.2  --   --   --   HGB 14.1  < > 13.1 13.0  HCT 41.6  < > 38.6* 38.6*  MCV 96.7  --  95.3 94.8  PLT 185  --  176 163  < > = values in this interval not displayed. Coagulation:   Recent Labs Lab 03/08/13 0925  LABPROT 13.3  INR 1.02   Cardiac Enzymes:   Recent Labs Lab 03/08/13 0925  TROPONINI <0.30   Urinalysis: No results found for this basename: COLORURINE, APPERANCEUR, LABSPEC, PHURINE, GLUCOSEU, HGBUR, BILIRUBINUR, KETONESUR, PROTEINUR, UROBILINOGEN, NITRITE, LEUKOCYTESUR,  in the last 168 hours Lipid Panel    Component Value Date/Time   CHOL 137 03/09/2013 0055   TRIG  130 03/09/2013 0055   HDL 28* 03/09/2013 0055   CHOLHDL 4.9 03/09/2013 0055   VLDL 26 03/09/2013 0055   LDLCALC 83 03/09/2013 0055   HgbA1C  Lab Results  Component Value Date   HGBA1C 6.1* 03/09/2013   Urine Drug Screen:   No results found for this basename: labopia,  cocainscrnur,  labbenz,  amphetmu,  thcu,  labbarb    Alcohol Level:   Recent Labs Lab 03/08/13 0925  ETH <11   CT of the brain   03/09/2013   Developing low density in the left basal ganglia/external capsule region consistent with acute infarction.  No mass effect or hemorrhage.    03/08/2013    1.  No definite acute large territory infarct.  No intraparenchymal or extra-axial hemorrhage. 2.  Prior infarcts involving the right subcortical parietal lobe and right cerebellum.  3.  Atrophy and advanced microvascular ischemic disease.    CT angio of the head 03/10/2013 Filling defect  throughout the left M1 segment compatible with an embolus causing acute infarct in the left basal ganglia. Mild atherosclerotic disease in the distal vertebral arteries and the cavernous carotid artery bilaterally. Negative for aneurysm.   MRI/A of the brain  pacemaker  2D Echocardiogram  EF 55-60% with no source of embolus.   Carotid Doppler  No evidence of hemodynamically significant internal carotid artery stenosis. Vertebral artery flow is antegrade.   CXR   03/09/2013 Persistent cardiac enlargement, CHF, left pleural effusion and left lower lobe atelectasis. 03/08/2013  Cardiomegaly with suspected mild interstitial edema.  Moderate left pleural effusion.  Associated left lower lobe opacity, possibly atelectasis.  EKG  normal sinus rhythm.   Therapy Recommendations CIR  Physical Exam   GENERAL EXAM: Patient is in no distress  CARDIOVASCULAR: Regular rate and rhythm, no murmurs, no carotid bruits  NEUROLOGIC: MENTAL STATUS: awake, alert, EXPRESSIVE APHASIA; FOLLOWS SIMPLE COMMANDS. CANNOT NAME. NO INTELLIGIBLE SPEECH.  CRANIAL NERVE: pupils equal and reactive to light, visual fields full to confrontation, extraocular muscles intact, no nystagmus, facial sensation symm, DECR RIGHT LOWER FACIAL STRENGTH. Uvula midline, shoulder shrug ABSENT ON RIGHT. Tongue DEVIATES TO RIGHT. MOTOR: RUE 0, RLE 1-2/5.  SENSORY: normal and symmetric to light touch COORDINATION: NOT FOLLOWING COMMANDS. REFLEXES: deep tendon reflexes present and symmetric; RIGHT TOE UPGOING.  ASSESSMENT Mr. Frank Weeks is a 79 y.o. male presenting with acute onset right hemiparesis, right facial weakness, and dysarthria. Imaging confirms a left basal ganglia infarct. Infarct felt to be embolic secondary to known atrial fibrillation, CT angio confirms M1 embolus. On aspirin 81 mg orally every day prior to admission. Now on aspirin 325 mg orally every day and heparin whiich was started last night from cardiology  recommendations for secondary stroke prevention. Patient with resultant dysphagia, left hemiparesis.   Remote history of subarachnoid hemorrhage 50 years ago presumably aneurysm negative as patient was treated conservatively. CT angiogram shows no evidence of aneurysm.   Hx paroxsymal atrial fibrillation. Dr. Graciela Husbands had recommended though patient refused coumadin,  as well as had reported difficulty gait, balance and falls. After attending discussion with cardiology, pt was started on full dose IV heparin last night. Hypertension Hyperlipidemia, LDL 83, on crestor 10 PTA, now on crestor 10 mg daily, goal LDL < 100 (< 70 for diabetics) Hx SAH in 1964 (reported as aneurysmal, but no craniotomy or clipping, resolved spontaneously per wife) - of note, CTA does not show any aneurysms, old or new, doubt ruptured aneurysm at that time, more likely non-aneurysmal  SAH, probably perimesencephalic in origin CAD - CABG 1610 R CEA Pacemaker - due to second degree heart block and bradycardia PVD Hx stroke at age 85, affecting his memory Hx "small strokes" with resultant loss of vision  Hospital day # 3  TREATMENT/PLAN  Discontinue IV heparin - use of IV heparin is contraindicated in acute stroke given risk for cerebral hemorrhage based on research. Recommend starting Eliquis due to lower risk profile for bleeding - no data contraindication for it`s use. Stop aspirin 325 mg orally every day.  CIR vs SNF at time of discharge  Annie Main, MSN, RN, ANVP-BC, ANP-BC, [facility] Pager: [phone] 03/11/2013 10:26 AM  I have personally examined this patient, reviewed chart, developed plan of care and agree with above.  Delia Heady, MD

## 2013-03-11 NOTE — Progress Notes (Signed)
Met with patient and his wife to discuss plans for patient's rehabilitation. Explained to them that [facility] limits rehab options to CIR or SNF, but not both. Explained Dr Rosalyn Charters assessment that pt would most likely require moderate assistance after d/c from CIR. Pt's wife states that she can only provide supervision - min assist. Therefore she is choosing to pursue SNF for pt's rehabilitation. She is interested in [facility], [facility] or [facility]. Have reported above to pt's CSW.  For questions, call [phone].

## 2013-03-11 NOTE — Consult Note (Signed)
CARDIOLOGY CONSULT NOTE  Patient ID: Frank Weeks MRN: [id number], DOB/AGE: 16-May-1929   Admit date: 03/08/2013 Date of Consult: 03/11/2013  Primary Physician: Ezequiel Kayser, MD Primary Cardiologist: Shela Commons. Hochrein, MD/S. Graciela Husbands, MD  Pt. Profile  79 y/o male with h/o CAD, PAF (38% of time by interrogation 06/2012), unsteady gait and falls, CVD/PVD, whom we've been asked to eval 2/2 CVA and need for anticoagulation.  Problem List  Past Medical History  Diagnosis Date  . Coronary artery disease     a. 2007 CABGx3: LIMA->LAD, VG->RI, VG->RCA;  b. 2010 Aden MV: nonischemic.  . Bradycardia     a. s/p PPM.  . PVD (peripheral vascular disease)   . HTN (hypertension)   . Dyslipidemia   . Subarachnoid hemorrhage     a. aneurysmal 1964, s/p clipping.  . Cerebrovascular disease     a. s/p R CEA;  b. 02/2013 Carotid U/S: no signif ICA stenosis.  Marland Kitchen GERD (gastroesophageal reflux disease)   . Stroke     a. first @ age 83->memory deficits;  b. 02/2013 embolic stroke - left basal ganglia.  . Glaucoma   . Pleural effusion   . PAF (paroxysmal atrial fibrillation)     a. previously refused coumadin and felt to be poor coumadin candidate 2/2 falls/unsteady gait;  b. 02/2013 Echo: EF 55-60%, mild to mod AS.    Past Surgical History  Procedure Laterality Date  . Coronary artery bypass graft  03/18/2006  . Right carotid endarectomy  09/2004  . Left carotid to vertebral bypass    . Dual-chamber pacemaker implantation    . Thoracentesis and bronchoscopy      Allergies  Allergies  Allergen Reactions  . Atorvastatin Other (See Comments)    Joints sore.    HPI   79 y/o male with the above complex problem list.  He has a h/o CAD s/p CABG x 3 in 2007 with negative Myoview in 2010.  He also has a h/o bradycardia and AV block s/p PPM.  He also has a h/o asymptomatic afib (up to 28% of the time according to interrogation 06/2012) and has always refused coumadin in the past 2/2 knowing someone on it who got  nose bleeds.  There's also a reported h/o unsteady gait and falls.  He was in his USOH on 5/25, when he walked out to his mailbox and fell suddenly.  This was witnessed by his wife and EMS was called.  He was found to have dense R sided hemiparesis and dysarthria.  Code STROKE was called and pt was taken to the [facility] ED emergently.  He was not felt to be a candidate for TPA 2/2 remote history of subarachnoid hemorrhage.  He was admitted and followed by neurology.  CTA showed a filling defect throughout the left M1 segment compatible with an embolus causing acute infarct in the left basal ganglia.  He has had PAF throughout his hospital stay and in light of new CVA, it is felt that the benefits of anticoagulation far outweigh the risks.  Pt and family are now agreeable to long term anticoagulation.  Initially heparin was started but was d/c'd this AM in favor of apixaban.  He denies chest pain, palpitations, dyspnea, pnd, orthopnea, n, v, dizziness, syncope, edema, weight gain, or early satiety.  He has persistent R sided wkns and dysarthria.  Upon entering the room, he was coughing and diaphoretic.  His son had just given him a sip of milk, which caused him to aspirate.  He is currently in no acute distress.  Inpatient Medications  . antiseptic oral rinse  15 mL Mouth Rinse q12n4p  . apixaban  5 mg Oral BID  . chlorhexidine  15 mL Mouth Rinse BID  . [START ON 03/12/2013] pneumococcal 23 valent vaccine  0.5 mL Intramuscular Tomorrow-1000  . rosuvastatin  10 mg Oral q1800  . Travoprost (BAK Free)  1 drop Both Eyes QHS   Family History Family History  Problem Relation Age of Onset  . Heart attack Mother   . Lung cancer Mother   . Heart attack Father   . Tremor      Social History History   Social History  . Marital Status: Married    Spouse Name: N/A    Number of Children: N/A  . Years of Education: N/A   Occupational History  . retired-sales    Social History Main Topics  . Smoking  status: Former Smoker    Quit date: 02/27/1951  . Smokeless tobacco: Not on file  . Alcohol Use: No  . Drug Use: Not on file  . Sexually Active: Not on file   Other Topics Concern  . Not on file   Social History Narrative  . No narrative on file    Review of Systems  General:  No chills, fever, night sweats or weight changes.  Cardiovascular:  No chest pain, dyspnea on exertion, edema, orthopnea, palpitations, paroxysmal nocturnal dyspnea. Dermatological: No rash, lesions/masses Respiratory: No cough, dyspnea Urologic: No hematuria, dysuria Abdominal:   No nausea, vomiting, diarrhea, bright red blood per rectum, melena, or hematemesis Neurologic:  R sided wkns/dysarthria.  No visual changes, wkns, changes in mental status. All other systems reviewed and are otherwise negative except as noted above.  Physical Exam  Blood pressure 106/68, pulse 66, temperature 98.3 F (36.8 C), temperature source Oral, resp. rate 18, height 5\' 8"  (1.727 m), weight 165 lb (74.844 kg), SpO2 96.00%.  General: Pleasant, NAD Psych: Normal affect. Neuro: Alert and oriented X 3. Expressive aphasia, R hemiparesis. HEENT: Normal  Neck: Supple without bruits or JVD. Lungs:  Resp regular and unlabored, scatt rhonchi. Heart: RRR no s3, s4, 2/6 syst murmur loudest @ bilat USB's, heard throughout. Abdomen: Soft, non-tender, non-distended, BS + x 4.  Extremities: No clubbing, cyanosis or edema. DP/PT/Radials 1+ and equal bilaterally.  Labs  Lab Results  Component Value Date   WBC 9.7 03/11/2013   HGB 13.0 03/11/2013   HCT 38.6* 03/11/2013   MCV 94.8 03/11/2013   PLT 163 03/11/2013    Recent Labs Lab 03/08/13 0925  03/10/13 0440  NA 137  < > 135  K 4.5  < > 3.9  CL 102  < > 103  CO2 25  --  22  BUN 15  < > 12  CREATININE 0.96  < > 0.75  CALCIUM 9.5  --  8.9  PROT 8.5*  --   --   BILITOT 0.4  --   --   ALKPHOS 93  --   --   ALT 18  --   --   AST 21  --   --   GLUCOSE 100*  < > 107*  < > =  values in this interval not displayed. Lab Results  Component Value Date   CHOL 137 03/09/2013   HDL 28* 03/09/2013   LDLCALC 83 03/09/2013   TRIG 130 03/09/2013   Radiology/Studies  Ct Angio Head W/cm &/or Wo Cm  03/10/2013   *RADIOLOGY REPORT*  Clinical Data:  Stroke.  Right-sided weakness and expressive aphasia.  Pacemaker  CT ANGIOGRAPHY HEAD  IMPRESSION: Filling defect throughout the left M1 segment compatible with an embolus causing acute infarct in the left basal ganglia.  Mild atherosclerotic disease in the distal vertebral arteries and the cavernous carotid artery bilaterally.  Negative for aneurysm.   Original Report Authenticated By: Janeece Riggers, M.D.   Dg Chest 2 View  03/09/2013   *RADIOLOGY REPORT*  Clinical Data: CVA.  CHEST - 2 VIEW  Comparison:  03/08/2013.  Findings: The heart is enlarged but stable.  Stable pacer wires. Persistent vascular congestion and interstitial edema along with a left pleural effusion and left lower lobe atelectasis.  IMPRESSION: Persistent cardiac enlargement, CHF, left pleural effusion and left lower lobe atelectasis.   Original Report Authenticated By: Rudie Meyer, M.D.   Ct Head Wo Contrast  03/09/2013   *RADIOLOGY REPORT*  Clinical Data: Right hemipareses.  Right facial weakness. Dysarthria.  CT HEAD WITHOUT CONTRAST    IMPRESSION: Developing low density in the left basal ganglia/external capsule region consistent with acute infarction.  No mass effect or hemorrhage.   Original Report Authenticated By: Paulina Fusi, M.D.   Ct Head Wo Contrast  03/08/2013   *RADIOLOGY REPORT*  Clinical Data: Acute onset right sided facial droop, altered speech  CT HEAD WITHOUT CONTRAST   IMPRESSION:  1.  No definite acute large territory infarct.  No intraparenchymal or extra-axial hemorrhage. 2.  Prior infarcts involving the right subcortical parietal lobe and right cerebellum.  3.  Atrophy and advanced microvascular ischemic disease.  Above findings discussed with Dr.  Leroy Kennedy at 09:39.   Original Report Authenticated By: Tacey Ruiz, MD   2D Echocardiogram 5.26.2014  Study Conclusions  - Left ventricle: The cavity size was normal. Wall thickness   was increased in a pattern of mild LVH. Systolic function   was normal. The estimated ejection fraction was in the   range of 55% to 60%. - Aortic valve: There was mild to moderate stenosis. Valve   area: 0.8cm^2(VTI). Valve area: 0.83cm^2 (Vmax). _____________  ECG  V paced, 70, underlying aflutter.  ASSESSMENT AND PLAN  1.  Acute Embolic CVA:  In the setting of PAF with residual R hemiparesis.  He has now been placed on apixaban therapy.  Pt and family are agreeable that this is best option and that benefit of therapy outweigh risks.    2.  PAF:  Rate controlled and asymptomatic.  Agree with apixaban.  3.  CAD:  No chest pain or sob.  ASA d/c'd in light of apixaban initiation.  Cont statin.  4.  HTN:  Stable.  5.  Dysphagia:  pts son gave him milk to drink prior to me entering the room.  Pt was coughing quite a bit upon my entry.  He has scattered rhonchi.  Will check cxr to r/o aspiration.  O2 sats stable.  Discussed importance of following speech Rx recs, which are posted over the pts bed.  Signed, Nicolasa Ducking, NP 03/11/2013, 5:37 PM Patient was seen with Mr. Brion Aliment, NP.  The patient has had a long history of paroxysmal atrial flutter fibrillation and until now has refused long-term anticoagulation.  Now with this new embolic stroke he is willing to accept long-term anticoagulation.  I agree that apixaban is an appropriate choice for this patient that the benefit of therapy outweighs the risk.  Agree with assessment and plan as noted above.

## 2013-03-11 NOTE — Progress Notes (Signed)
ANTICOAGULATION CONSULT NOTE - Follow Up Consult  Pharmacy Consult for Heparin  Indication: atrial fibrillation  Allergies  Allergen Reactions  . Atorvastatin Other (See Comments)    Joints sore.     Patient Measurements: Height: 5\' 8"  (172.7 cm) Weight: 165 lb (74.844 kg) (from previous admission) IBW/kg (Calculated) : 68.4 Heparin Dosing Weight: 74.8 kg  Vital Signs: Temp: 98.3 F (36.8 C) (05/27 2102) Temp src: Oral (05/27 2102) BP: 146/74 mmHg (05/27 2102) Pulse Rate: 67 (05/27 2102)  Labs:  Recent Labs  03/08/13 0925 03/08/13 0931 03/10/13 0440 03/11/13 0010  HGB 14.1 15.0 13.1  --   HCT 41.6 44.0 38.6*  --   PLT 185  --  176  --   APTT 28  --   --   --   LABPROT 13.3  --   --   --   INR 1.02  --   --   --   HEPARINUNFRC  --   --   --  0.24*  CREATININE 0.96 0.90 0.75  --   TROPONINI <0.30  --   --   --     Estimated Creatinine Clearance: 68.9 ml/min (by C-G formula based on Cr of 0.75).   Medical History: Past Medical History  Diagnosis Date  . Coronary artery disease     Prior CABG  . Chest pain, non-cardiac     Negative Myoview  . Pacemaker     Second degree heart block and bradycardia  . PVD (peripheral vascular disease)   . HTN (hypertension)   . Dyslipidemia   . Subarachnoid hemorrhage   . Cerebrovascular disease   . GERD (gastroesophageal reflux disease)   . Second degree AV block, Mobitz type II   . Heart block AV first degree   . SOB (shortness of breath)   . Stroke   . Glaucoma   . Pleural effusion     Medications:  Prescriptions prior to admission  Medication Sig Dispense Refill  . aspirin EC 81 MG tablet Take 1 tablet (81 mg total) by mouth daily.      Marland Kitchen co-enzyme Q-10 30 MG capsule Take 30 mg by mouth 2 (two) times daily.      . fish oil-omega-3 fatty acids 1000 MG capsule Take 2 g by mouth daily.        . nitroGLYCERIN (NITROSTAT) 0.4 MG SL tablet Place 0.4 mg under the tongue every 5 (five) minutes as needed for chest  pain.      Marland Kitchen permethrin (ELIMITE) 5 % cream Apply 1 application topically once. Took first treatment and needs second treatment on Wednesday.      . ramipril (ALTACE) 10 MG capsule Take 10 mg by mouth daily.        . rosuvastatin (CRESTOR) 10 MG tablet Take 10 mg by mouth. Take 1 tablet by mouth three times a week.       . travoprost, benzalkonium, (TRAVATAN) 0.004 % ophthalmic solution Place 1 drop into both eyes at bedtime.         Scheduled:  . antiseptic oral rinse  15 mL Mouth Rinse q12n4p  . aspirin  325 mg Oral Daily  . chlorhexidine  15 mL Mouth Rinse BID  . rosuvastatin  10 mg Oral q1800  . Travoprost (BAK Free)  1 drop Both Eyes QHS    Assessment: 79 y.o male with left basal ganglia infarct. Infarct felt to be embolic secondary to known atrial fibrillation.  Now on IV heparin for Afib.  Heparin level (0.24) is below-goal on 1000 units/hr. No problem with line / infusion and no bleeding per RN.  Goal of Therapy:  Heparin level = 0.3-0.5 units/ml Monitor platelets by anticoagulation protocol: Yes   Plan:  1. Increase IV heparin to 1150 units/hr.  2. Heparin level in 8 hours.   Lorre Munroe, PharmD 03/11/2013,1:55 AM

## 2013-03-11 NOTE — Progress Notes (Signed)
[facility] PROGRESS NOTE  Frank Weeks GNF:[id number] DOB: April 30, 1929 DOA: 03/08/2013 PCP: Frank Kayser, MD  Assessment/Plan: CVA - suspect L basal ganglia/external capsule  - was on ASA 81 mg at home  - now on aspirin 325 for secondary stroke prevention  - Neuro suggests considering anticoagulation with history of paroxysmal atrial fibrillation  - Frank Weeks is continued on heparin drip. Per Frank Weeks had been adamant about not taking Coumadin in the past- Frank Weeks had Weeks we wait until he is more oriented before calling cardiology consult- Frank Weeks's noted from 5/14 mentions "He has not been treated with anticoagulation because of difficulty gait, balance and falls."  - at this point it is difficult to tell if Weeks will ever ambulate again and if this is the case, then he should be anticoagulated.  - cleared for D1 nectar diet per SLP  - CIR eval ordered as Weeks per Weeks- wife is concerned about his care when he goes home- apparently does not have medicare benefits to pay for SNF- social work consult pending CAD s/p CABG 2007  cotninue ASA/Statin. Stable  PVD s/p R CEA  No signif stenosis noted via dopplers  H/o Parox afib-  -followed by Frank Weeks  -was not on anticoagulation, though wife reports Frank Weeks had suggested coumadin previously and Weeks himself decided against it - -see notes above under "CVA"  HTN:  Stable - cont to hold lisinopril for now to avoid potential relative hypotension  S/p pacemaker due to second degree heart block and bradycardia  HLD  On oral agent  Scabies  Was diagnoses by Frank Weeks (Dermatology) as an outpt - is due a final dose of permetherin cream on Wednesday (wife will bring from home) - wife has undergone empiric tx as well  Constipation -Abdomen is mildly distended and tympanic. Frank Weeks claims to have not moved his bowels in at least several days we'll continue with aggressive bowel regimen.   Code Status: DO NOT RESUSCITATE Family  Communication: Frank Weeks in room (indicate person spoken with, relationship, and if by phone, the number) Disposition Plan: Pending   Consultants:  Neurology  Procedures: 5/25 - carotid dopplers - no significant ICA stenosis - right CEA appears patent - vertebral artery flow is antegrade   5/26 - TTE - pending   Antibiotics:  Permethrin crane, to end on 03/11/2013 (indicate start date, and stop date if known)  HPI/Subjective: Frank Weeks is without complaints this morning. He does note now moving his bowels for several days. Otherwise, the Frank Weeks is noted to have decreased appetite  Objective: Filed Vitals:   03/10/13 1930 03/10/13 2102 03/11/13 0244 03/11/13 0613  BP: 128/66 146/74 128/65 121/65  Pulse: 80 67 72 67  Temp: 99.1 F (37.3 C) 98.3 F (36.8 C) 97.4 F (36.3 C) 97.9 F (36.6 C)  TempSrc: Oral Oral Oral Oral  Resp: 18 18 16 18   Height:      Weight:      SpO2: 99% 97% 99% 94%    Intake/Output Summary (Last 24 hours) at 03/11/13 0846 Last data filed at 03/10/13 1800  Gross per 24 hour  Intake    520 ml  Output    825 ml  Net   -305 ml   Filed Weights   03/09/13 2100  Weight: 74.844 kg (165 lb)    Exam:   General:  Frank Weeks's awake in no apparent distress  Cardiovascular: Regular S1-S2  Respiratory: Normal respiratory effort, no crackles no wheezing  Abdomen: Mildly distended,  tympanic, decreased bowel sounds  Musculoskeletal: Perfused, no clubbing or cyanosis   Data Reviewed: Basic Metabolic Panel:  Recent Labs Lab 03/08/13 0925 03/08/13 0931 03/10/13 0440  NA 137 142 135  K 4.5 4.5 3.9  CL 102 106 103  CO2 25  --  22  GLUCOSE 100* 98 107*  BUN 15 17 12   CREATININE 0.96 0.90 0.75  CALCIUM 9.5  --  8.9   Liver Function Tests:  Recent Labs Lab 03/08/13 0925  AST 21  ALT 18  ALKPHOS 93  BILITOT 0.4  PROT 8.5*  ALBUMIN 3.6   No results found for this basename: LIPASE, AMYLASE,  in the last 168 hours No results found for this  basename: AMMONIA,  in the last 168 hours CBC:  Recent Labs Lab 03/08/13 0925 03/08/13 0931 03/10/13 0440 03/11/13 0600  WBC 9.9  --  9.7 9.7  NEUTROABS 7.2  --   --   --   HGB 14.1 15.0 13.1 13.0  HCT 41.6 44.0 38.6* 38.6*  MCV 96.7  --  95.3 94.8  PLT 185  --  176 163   Cardiac Enzymes:  Recent Labs Lab 03/08/13 0925  TROPONINI <0.30   BNP (last 3 results) No results found for this basename: PROBNP,  in the last 8760 hours CBG:  Recent Labs Lab 03/08/13 0950 03/09/13 0733 03/09/13 1146 03/09/13 1534  GLUCAP 93 110* 125* 138*    Recent Results (from the past 240 hour(s))  MRSA PCR SCREENING     Status: None   Collection Time    03/08/13  3:13 PM      Result Value Range Status   MRSA by PCR NEGATIVE  NEGATIVE Final   Comment:            The GeneXpert MRSA Assay (FDA     approved for NASAL specimens     only), is one component of a     comprehensive MRSA colonization     surveillance program. It is not     intended to diagnose MRSA     infection nor to guide or     monitor treatment for     MRSA infections.     Studies: Ct Angio Head W/cm &/or Wo Cm  03/10/2013   *RADIOLOGY REPORT*  Clinical Data:  Stroke.  Right-sided weakness and expressive aphasia.  Pacemaker  CT ANGIOGRAPHY HEAD  Technique:  Multidetector CT imaging of the head was performed using the standard protocol during bolus administration of intravenous contrast.  Multiplanar CT image reconstructions including MIPs were obtained to evaluate the vascular anatomy.  Contrast: 80mL OMNIPAQUE IOHEXOL 350 MG/ML SOLN  Comparison:  CT head 03/09/2013  Findings:  Progression of ill-defined hypodensity in the left basal ganglia consistent with acute infarct.  This involves essentially all of the putamen.  The left globus pallidus may also be involved. No other acute infarct or hemorrhage.  There is generalized atrophy and chronic microvascular ischemia in the white matter.  Chronic infarct right cerebellum is  unchanged.  Both vertebral arteries are patent to the basilar.  There is mild atherosclerotic disease in the distal basilar bilaterally.  Right PICA is widely patent.  Left PICA not visualized.  PICA and superior cerebellar arteries are patent bilaterally.  Posterior cerebral arteries are patent bilaterally.  Right cavernous carotid is patent with atherosclerotic disease and mild stenosis.  Right anterior and  middle cerebral arteries are patent bilaterally.  Left cavernous carotid shows mild atherosclerotic disease and mild stenosis.  There is filling defect in the left M1 segment compatible with thrombus or embolus.  The Frank Weeks does have atrial fibrillation suggesting   embolus.  This is nonobstructing and there is flow in left middle cerebral artery branches which appear normal.  Left anterior cerebral artery is widely patent.  Negative for cerebral aneurysm.   Review of the MIP images confirms the above findings.  IMPRESSION: Filling defect throughout the left M1 segment compatible with an embolus causing acute infarct in the left basal ganglia.  Mild atherosclerotic disease in the distal vertebral arteries and the cavernous carotid artery bilaterally.  Negative for aneurysm.   Original Report Authenticated By: Janeece Riggers, M.D.   Dg Swallowing Func-speech Pathology  03/09/2013   Riley Nearing [place]blois, CCC-SLP     03/09/2013 11:36 AM Objective Swallowing Evaluation: Modified Barium Swallowing Study   Frank Weeks Details  Name: Frank Weeks MRN: [id number] Date of Birth: 07/07/1929  Today's Date: 03/09/2013 Time: 4098-1191 SLP Time Calculation (min): 29 min  Past Medical History:  Past Medical History  Diagnosis Date  . Coronary artery disease     Prior CABG  . Chest pain, non-cardiac     Negative Myoview  . Pacemaker     Second degree heart block and bradycardia  . PVD (peripheral vascular disease)   . HTN (hypertension)   . Dyslipidemia   . Subarachnoid hemorrhage   . Cerebrovascular disease   . GERD  (gastroesophageal reflux disease)   . Second degree AV block, Mobitz type II   . Heart block AV first degree   . SOB (shortness of breath)   . Stroke   . Glaucoma(365)   . Pleural effusion    Past Surgical History:  Past Surgical History  Procedure Laterality Date  . Coronary artery bypass graft  03/18/2006  . Right carotid endarectomy  09/2004  . Left carotid to vertebral bypass    . Dual-chamber pacemaker implantation    . Thoracentesis and bronchoscopy     HPI:  79 y.o. male with acute onset right hemiparesis, right face  weakness, and dysarthria. Demosntrated overt evidence of  aspiration at bedside, MBS to objectively evaluate function.      Assessment / Plan / Recommendation Clinical Impression  Dysphagia Diagnosis: Moderate oral phase dysphagia;Moderate  pharyngeal phase dysphagia Clinical impression: Weeks presents with a sensorimotor oral  dysphagia with right oral weakness and numbness and lingual  weakness leading to moderate oral residuals and anterior  spillage. With max assist to place bites in on the left and sweep  right buccal cavity, the Weeks can manage soft solids. Will attempt  in therapy.   Pharyngeal phase characterized primarily by sensory deficits  with decreased sensation for swallow trigger. Thin liquids are  aspirated before the swallow with sensation of only significant  aspiration. Cough is not sufficient to expel aspirate. Recommend  Dys 1 (puree) diet and nectar thick liquids via straw with full  supervision. SLP will follow for tolerance and therapeutic  intervention.     Treatment Recommendation  Therapy as outlined in treatment plan below    Diet Recommendation Dysphagia 1 (Puree);Nectar-thick liquid   Liquid Administration via: Straw Medication Administration: Crushed with puree Supervision: Full supervision/cueing for compensatory  strategies;Frank Weeks able to self feed Compensations: Slow rate;Small sips/bites;Check for  pocketing;Check for anterior loss Postural Changes and/or Swallow  Maneuvers: Seated upright 90  degrees;Out of bed for meals    Other  Recommendations Weeks Consults: MBS Oral Care Recommendations: Oral care BID;Oral care before and  after PO (clear out mouth after PO) Other Recommendations: Order thickener from pharmacy   Follow Up Recommendations  Inpatient Rehab    Frequency and Duration min 2x/week  2 weeks   Pertinent Vitals/Pain NA    SLP Swallow Goals Frank Weeks will utilize Weeks strategies during swallow to  increase swallowing safety with: Moderate cueing Swallow Study Goal #2 - Progress: Progressing toward goal   General HPI: 79 y.o. male with acute onset right hemiparesis,  right face weakness, and dysarthria. Demosntrated overt evidence  of aspiration at bedside, MBS to objectively evaluate function.  Type of Study: Modified Barium Swallowing Study Reason for Referral: Objectively evaluate swallowing function Diet Prior to this Study: NPO Temperature Spikes Noted: No Respiratory Status: Room air History of Recent Intubation: No Behavior/Cognition: Alert;Cooperative;Pleasant mood Oral Cavity - Dentition: Adequate natural dentition Oral Motor / Sensory Function: Impaired - see Bedside swallow  eval Self-Feeding Abilities: Able to feed self;Needs assist Frank Weeks Positioning: Upright in chair Baseline Vocal Quality: Clear;Low vocal intensity Volitional Cough: Weak Volitional Swallow: Able to elicit Anatomy: Within functional limits Pharyngeal Secretions: Not observed secondary MBS    Reason for Referral Objectively evaluate swallowing function   Oral Phase Oral Preparation/Oral Phase Oral Phase: Impaired Oral - Nectar Oral - Nectar Cup: Right anterior bolus loss;Weak lingual  manipulation;Right pocketing in lateral sulci;Delayed oral  transit Oral - Nectar Straw: Delayed oral transit;Weak lingual  manipulation Oral - Thin Oral - Thin Cup: Right anterior bolus loss;Weak lingual  manipulation;Right pocketing in lateral sulci;Delayed oral  transit Oral - Solids Oral  - Puree: Right anterior bolus loss;Weak lingual  manipulation;Right pocketing in lateral sulci;Delayed oral  transit Oral - Mechanical Soft: Right anterior bolus loss;Weak lingual  manipulation;Right pocketing in lateral sulci;Delayed oral  transit Oral - Pill: Delayed oral transit;Pocketing in anterior  sulcus;Reduced posterior propulsion   Pharyngeal Phase Pharyngeal Phase Pharyngeal Phase: Impaired Pharyngeal - Nectar Pharyngeal - Nectar Cup: Delayed swallow initiation;Premature  spillage to pyriform sinuses Pharyngeal - Nectar Straw: Delayed swallow initiation;Premature  spillage to pyriform sinuses Penetration/Aspiration details (nectar straw): Material does not  enter airway Pharyngeal - Thin Pharyngeal - Thin Cup: Delayed swallow initiation;Premature  spillage to pyriform sinuses;Penetration/Aspiration before  swallow;Significant aspiration (Amount) Penetration/Aspiration details (thin cup): Material enters  airway, passes BELOW cords without attempt by Frank Weeks to eject  out (silent aspiration);Material enters airway, passes BELOW  cords and not ejected out despite cough attempt by Frank Weeks (chin  tuck increases aspiration. ) Pharyngeal - Solids Pharyngeal - Puree: Delayed swallow initiation;Premature spillage  to valleculae Pharyngeal - Mechanical Soft: Premature spillage to  valleculae;Delayed swallow initiation Pharyngeal - Pill: Not tested  Cervical Esophageal Phase    GO   Harlon Ditty, MA CCC-SLP [phone]            DeBlois, Riley Nearing 03/09/2013, 11:33 AM     Scheduled Meds: . antiseptic oral rinse  15 mL Mouth Rinse q12n4p  . aspirin  325 mg Oral Daily  . chlorhexidine  15 mL Mouth Rinse BID  . [START ON 03/12/2013] pneumococcal 23 valent vaccine  0.5 mL Intramuscular Tomorrow-1000  . rosuvastatin  10 mg Oral q1800  . Travoprost (BAK Free)  1 drop Both Eyes QHS   Continuous Infusions: . sodium chloride 50 mL/hr at 03/09/13 2100  . heparin 1,150 Units/hr (03/11/13 0243)    Active  Problems:   HYPERTENSION, UNSPECIFIED   CAD, ARTERY BYPASS GRAFT   Atrioventricular block, complete   Atrial fibrillation   Subarachnoid hemorrhage   Hemiparesis  Time spent: 20 minutes    Valerya Maxton K  [facility] Pager [phone]. If 7PM-7AM, please contact night-coverage at [email or web address], password [facility] 03/11/2013, 8:46 AM  LOS: 3 days            .

## 2013-03-11 NOTE — Progress Notes (Signed)
Physical Therapy Treatment Patient Details Name: Frank Weeks MRN: [id number] DOB: [birth year] Today's Date: 03/11/2013 Time: 8657-8469 PT Time Calculation (min): 30 min  PT Assessment / Plan / Recommendation Comments on Treatment Session  Pt with acute right hemiparesis, right facial weakness, and expressive aphasia. Pt with great willingness to work with physical therapy. Pt able to tolerate sit to stand x3, midline perception trainig both sitting and standing. Pt with left LE weakness and becoming fatigued with activity. Acute PT to maximize functional mobility and saftey for d/c to next venue.    Follow Up Recommendations  CIR     Does the patient have the potential to tolerate intense rehabilitation   yes     Equipment Recommendations  Other (comment) (TBD)       Frequency Min 4X/week   Plan Discharge plan remains appropriate    Precautions / Restrictions Precautions Precautions: Fall Precaution Comments: expressive aphasia, swallow precautions Restrictions Weight Bearing Restrictions: No   Pertinent Vitals/Pain Pt denies pain at this time    Mobility  Bed Mobility Bed Mobility: Sit to Supine Rolling Right: 4: Min assist;With rail Sitting - Scoot to [place] of Chair: 3: Mod assist Sit to Supine: 2: Max assist Details for Bed Mobility Assistance: performed at end of treatment when pt already fatigued. Assist with LE up to the bed and controlling trunk descent. Transfers Transfers: Sit to Stand;Stand to Sit;Squat Pivot Transfers Sit to Stand: 1: +2 Total assist;From chair/3-in-1 Sit to Stand: Patient Percentage: 30% Stand to Sit: 1: +2 Total assist;To chair/3-in-1;To bed Stand to Sit: Patient Percentage: 30% Squat Pivot Transfers: 1: +2 Total assist Squat Pivot Transfers: Patient Percentage: 50% Details for Transfer Assistance: Sit to stand with facilitation of hip extension, right knee extension, and v/c and t/c for upright trunk.  Ambulation/Gait Ambulation/Gait  Assistance: Not tested (comment)    PT Goals Acute Rehab PT Goals PT Goal: Sit at Edge Of Bed - Progress: Progressing toward goal PT Goal: Sit to Stand - Progress: Progressing toward goal PT Transfer Goal: Bed to Chair/Chair to Bed - Progress: Progressing toward goal PT Goal: Stand - Progress: Progressing toward goal PT Goal: Ambulate - Progress: Progressing toward goal  Visit Information  Last PT Received On: 03/11/13 Assistance Needed: +2    Subjective Data  Subjective: Pt using head nods and minor facial expressions Patient Stated Goal: not stated   Cognition  Cognition Arousal/Alertness: Awake/alert Behavior During Therapy: WFL for tasks assessed/performed Overall Cognitive Status: Difficult to assess Area of Impairment: Problem solving;Awareness Following Commands: Follows one step commands with increased time Problem Solving: Slow processing General Comments: Pt with appropriate yes/no responses throughout treatment. Facial expressions situationally appropriate throughout treatment. 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 Min assist initially to facilitate correction to midline. 3 minutes. Dynamic Sitting Balance Dynamic Sitting - Balance Support: Feet supported;No upper extremity supported Dynamic Sitting - Level of Assistance: 5: Stand by assistance;4: Min assist Dynamic Sitting Balance - Compensations: Sitting in chair, pt leaned to the left by therapist and then asked to correct back to midline. Pt able to correct with 80% accuracy with only v/c. Pt also sitting edge of chair and reaching for objects  in all directions while maintaining  midline. Initially pt gaze fixated on IV pole for midline reference, but pt able to looke to the right for objects with v/c. Occasional min assist required with large reach. 10 minutes. Static Standing Balance Static Standing - Balance Support: During functional activity Static Standing - Level of Assistance: 1: +2 Total assist Static Standing - Comment/# of Minutes: 5 minutes. Provided upward facilitation to right pelvis, hip extension, and upright trunk. Weight shift to the left requiring max facilitation.  Dynamic Standing Balance Dynamic Standing - Level of Assistance: 1: +2 Total assist Dynamic Standing - Balance Activities: Lateral lean/weight shifting Dynamic Standing - Comments: Provided upward facilitation to right pelvis, hip extension, and upright trunk. Weight shift to the left requiring max facilitation. Stood for 3 minutes total with seated rest breaks twice.  End of Session PT - End of Session Equipment Utilized During Treatment: Gait belt Activity Tolerance: Patient tolerated treatment well Patient left: in bed;with call bell/phone within reach;with bed alarm set;with family/visitor present Nurse Communication: Mobility status   GP     Payton Doughty 03/11/2013, 2:39 PM Payton Doughty, PT Student

## 2013-03-11 NOTE — Progress Notes (Signed)
Occupational Therapy Treatment Patient Details Name: Frank Weeks MRN: [id number] DOB: 02/08/1929 Today's Date: 03/11/2013 Time: 7829-5621 OT Time Calculation (min): 24 min  OT Assessment / Plan / Recommendation Comments on Treatment Session   Pt admitted with dense R hemiplegia. 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 Pt has old infarcts and a significant cardiac hx. CT positive for embolus causing acute infarct in the left basal ganglia. Session focused on bed mobility, sitting and basic transfer eob<>Chair. Pt demonstrates attention to right side during session due to OT positioning during session. Remains excellent CIR candidate and progressing well.    Follow Up Recommendations  CIR    Barriers to Discharge       Equipment Recommendations  3 in 1 bedside comode;Wheelchair (measurements OT);Wheelchair cushion (measurements OT)    Recommendations for Other Services Rehab consult  Frequency Min 3X/week   Plan Discharge plan remains appropriate    Precautions / Restrictions Precautions Precautions: Fall Precaution Comments: expressive aphasia, swallow precautions   Pertinent Vitals/Pain None reported Shakes head no to questioning    ADL  Toilet Transfer: +2 Total assistance Toilet Transfer: Patient Percentage: 30% Toilet Transfer Method: Stand pivot Toilet Transfer Equipment: Raised toilet seat with arms (or 3-in-1 over toilet) Equipment Used: Gait belt Transfers/Ambulation Related to ADLs: Pt completed a stand pivot to the left side. 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 Pt initiated task and needed Rt LE blocking ADL Comments: Pt educated on log roll to the right side using Lt side. Pt smiling with ability to log roll with minimal (A). Pt with (A) supine <>sit eob. Pt demonstrates pushers syndrome initially. Pt with one finger space subluxation in Rt glenohumeral joint noted. Pt educated on protecting RT LE. Pt completed transfer to chair. RN Maureen Ralphs  arriving during transfer due to HR changes and monitor notified RN.Pt allowed time to rest in chair and all abnormal HR resolved. Pt excellent return demo of using Lt LE to (A) Rt LE to lift it for reposition of chair. Pt needed cues to (A) rt side of body. Question some right inattention    OT Diagnosis:    OT Problem List:   OT Treatment Interventions:     OT Goals Acute Rehab OT Goals OT Goal Formulation: With patient Time For Goal Achievement: 03/23/13 Potential to Achieve Goals: Good ADL Goals Pt Will Perform Eating: with supervision;Supported;Sitting, chair Pt Will Perform Grooming: Sitting, chair;with set-up Pt Will Transfer to Toilet: with mod assist;with DME;Stand pivot transfer ADL Goal: Toilet Transfer - Progress: Progressing toward goals Miscellaneous OT Goals Miscellaneous OT Goal #1: Pt will scan R visual field to locate items on plate or tray during ADL without cues. OT Goal: Miscellaneous Goal #1 - Progress: Progressing toward goals Miscellaneous OT Goal #2: Pt will sit with supervision at EOB in prep for ADL. OT Goal: Miscellaneous Goal #2 - Progress: Progressing toward goals Miscellaneous OT Goal #3: Pt/family will perform P/AAROM R UE independently. Miscellaneous OT Goal #4: Pt will protect R UE during mobility/transfers with minimal reminders. OT Goal: Miscellaneous Goal #4 - Progress: Progressing toward goals Miscellaneous OT Goal #5: Pt will perform bed mobility with min assist in prep for ADL at EOB. OT Goal: Miscellaneous Goal #5 - Progress: Progressing toward goals  Visit Information  Last OT Received On: 03/11/13 Assistance Needed: +2    Subjective Data      Prior Functioning       Cognition  Cognition Arousal/Alertness: Awake/alert Behavior During  Therapy: WFL for tasks assessed/performed Overall Cognitive Status: Difficult to assess Area of Impairment: Following commands;Problem solving;Awareness Following Commands: Follows one step commands with  increased time Problem Solving: Slow processing General Comments: pt responding appropriately to YES NO questions 4 out 4. Pt attempting to speak to therapist and speak unintelligible majority of attempts. "yes mamam " was response to do you need a blanket Difficult to assess due to: Impaired communication    Mobility  Bed Mobility Bed Mobility: Rolling Right;Right Sidelying to Sit;Supine to Sit;Sitting - Scoot to [place] of Bed Rolling Right: 4: Min assist;With rail (tactile cues) Right Sidelying to Sit: 3: Mod assist;With rails (pushing on rail with LT UE) Supine to Sit: 3: Mod assist;With rails Sitting - Scoot to Edge of Bed: 3: Mod assist (pt initiating LT side of body only) Details for Bed Mobility Assistance: Pt turning head to the right rail with cue and reaching with LT UE. Pt initiating with left sid eof body into rolling. Pt in sitting decr activation to right side of body. Pt pushing with Lt UE and activating core muscels on left side. Pt slightly implusive with chair in position and needing cues to slow down. pt just eager to continue to progress OOB.  Transfers Transfers: Sit to Stand;Stand to Sit Sit to Stand: 1: +2 Total assist;From bed Sit to Stand: Patient Percentage: 30% Stand to Sit: 1: +2 Total assist;To chair/3-in-1 Stand to Sit: Patient Percentage: 30% Details for Transfer Assistance: Pt needed (A) to control descend to chair and blocking of Rt LE. pt with facilitation to shift weight to allow LT LE to attempt to step.     Exercises      Balance     End of Session OT - End of Session Activity Tolerance: Patient tolerated treatment well Patient left: in chair;with call bell/phone within reach;with family/visitor present Nurse Communication: Mobility status;Precautions  GO     Lucile Shutters 03/11/2013, 1:55 PM Pager: [phone]

## 2013-03-11 NOTE — Progress Notes (Signed)
I have read and agree with the below treatment note and plan. Jurline Folger Helen Whitlow PT, DPT Pager: [phone] 

## 2013-03-11 NOTE — Progress Notes (Signed)
Call received from pharmacist to increase heparin drip from 10-11.42ml/hr.

## 2013-03-12 LAB — CBC
HCT: 37.9 % — ABNORMAL LOW (ref 39.0–52.0)
MCV: 93.6 fL (ref 78.0–100.0)
RDW: 13.1 % (ref 11.5–15.5)
WBC: 11.1 10*3/uL — ABNORMAL HIGH (ref 4.0–10.5)

## 2013-03-12 MED ORDER — POLYETHYLENE GLYCOL 3350 17 G PO PACK
17.0000 g | PACK | Freq: Every day | ORAL | Status: DC
  Administered 2013-03-12 – 2013-03-13 (×2): 17 g via ORAL
  Filled 2013-03-12 (×2): qty 1

## 2013-03-12 NOTE — Progress Notes (Signed)
Stroke Team Progress Note  HISTORY Frank Weeks is an 79 y.o. male with a past medical history significant for hypertension, hyperlipidemia, CAD s/p CABG, s/p pacemaker placement, stroke without residual deficit, aneurysmal SAH at age 66 years old, brought to [facility] by medics 03/08/2013 as a code stroke due to acute onset right hemiparesis, right facial weakness, and dysarthria.  He was last seen well at 8:15 am. He walked to the mailbox and fell. Ambulance was called and they found him dysarthric with dense right hemiparesis and right face weakness.  He has no residual deficits from prior stroke and SAH.  NIHSS 22. CT brain showed no acute intracranial abnormality.  Patient was not a TPA candidate secondary to history of aneurysmal SAH. He was admitted for further evaluation and treatment.  SUBJECTIVE Patient working with PT currently. No family at bedside.  OBJECTIVE Most recent Vital Signs: Filed Vitals:   03/11/13 1757 03/11/13 2205 03/12/13 0635 03/12/13 0938  BP: 132/78 123/90 126/78 116/63  Pulse: 86 84 70 70  Temp: 97.5 F (36.4 C) 98.3 F (36.8 C) 98.2 F (36.8 C) 97.3 F (36.3 C)  TempSrc: Oral Oral Oral Oral  Resp: 20 20 18 18   Height:      Weight:      SpO2: 100% 98% 97% 98%   CBG (last 3)   Recent Labs  03/09/13 1146 03/09/13 1534  GLUCAP 125* 138*   IV Fluid Intake:   . sodium chloride 50 mL/hr at 03/12/13 0735   MEDICATIONS  . antiseptic oral rinse  15 mL Mouth Rinse q12n4p  . apixaban  5 mg Oral BID  . chlorhexidine  15 mL Mouth Rinse BID  . pneumococcal 23 valent vaccine  0.5 mL Intramuscular Tomorrow-1000  . rosuvastatin  10 mg Oral q1800  . Travoprost (BAK Free)  1 drop Both Eyes QHS   PRN:  RESOURCE THICKENUP CLEAR, senna-docusate  Diet:  Dysphagia 1 nectar thick Activity:  Up with assistance  DVT Prophylaxis:  Lovenox 40 mg sq daily   CLINICALLY SIGNIFICANT STUDIES Basic Metabolic Panel:   Recent Labs Lab 03/08/13 0925 03/08/13 0931  03/10/13 0440  NA 137 142 135  K 4.5 4.5 3.9  CL 102 106 103  CO2 25  --  22  GLUCOSE 100* 98 107*  BUN 15 17 12   CREATININE 0.96 0.90 0.75  CALCIUM 9.5  --  8.9   Liver Function Tests:   Recent Labs Lab 03/08/13 0925  AST 21  ALT 18  ALKPHOS 93  BILITOT 0.4  PROT 8.5*  ALBUMIN 3.6   CBC:   Recent Labs Lab 03/08/13 0925  03/11/13 0600 03/12/13 0540  WBC 9.9  < > 9.7 11.1*  NEUTROABS 7.2  --   --   --   HGB 14.1  < > 13.0 13.2  HCT 41.6  < > 38.6* 37.9*  MCV 96.7  < > 94.8 93.6  PLT 185  < > 163 185  < > = values in this interval not displayed. Coagulation:   Recent Labs Lab 03/08/13 0925  LABPROT 13.3  INR 1.02   Cardiac Enzymes:   Recent Labs Lab 03/08/13 0925  TROPONINI <0.30   Urinalysis: No results found for this basename: COLORURINE, APPERANCEUR, LABSPEC, PHURINE, GLUCOSEU, HGBUR, BILIRUBINUR, KETONESUR, PROTEINUR, UROBILINOGEN, NITRITE, LEUKOCYTESUR,  in the last 168 hours Lipid Panel    Component Value Date/Time   CHOL 137 03/09/2013 0055   TRIG 130 03/09/2013 0055   HDL 28* 03/09/2013 0055  CHOLHDL 4.9 03/09/2013 0055   VLDL 26 03/09/2013 0055   LDLCALC 83 03/09/2013 0055   HgbA1C  Lab Results  Component Value Date   HGBA1C 6.1* 03/09/2013   Urine Drug Screen:   No results found for this basename: labopia,  cocainscrnur,  labbenz,  amphetmu,  thcu,  labbarb    Alcohol Level:   Recent Labs Lab 03/08/13 0925  ETH <11   CT of the brain   03/09/2013   Developing low density in the left basal ganglia/external capsule region consistent with acute infarction.  No mass effect or hemorrhage.    03/08/2013    1.  No definite acute large territory infarct.  No intraparenchymal or extra-axial hemorrhage. 2.  Prior infarcts involving the right subcortical parietal lobe and right cerebellum.  3.  Atrophy and advanced microvascular ischemic disease.    CT angio of the head 03/10/2013 Filling defect throughout the left M1 segment compatible with an  embolus causing acute infarct in the left basal ganglia. Mild atherosclerotic disease in the distal vertebral arteries and the cavernous carotid artery bilaterally. Negative for aneurysm.   MRI/A of the brain  pacemaker  2D Echocardiogram  EF 55-60% with no source of embolus.   Carotid Doppler  No evidence of hemodynamically significant internal carotid artery stenosis. Vertebral artery flow is antegrade.   CXR   03/09/2013 Persistent cardiac enlargement, CHF, left pleural effusion and left lower lobe atelectasis. 03/08/2013  Cardiomegaly with suspected mild interstitial edema.  Moderate left pleural effusion.  Associated left lower lobe opacity, possibly atelectasis.  EKG  normal sinus rhythm.   Therapy Recommendations CIR  Physical Exam   GENERAL EXAM: Patient is in no distress  CARDIOVASCULAR: Regular rate and rhythm, no murmurs, no carotid bruits  NEUROLOGIC: MENTAL STATUS: awake, alert, EXPRESSIVE APHASIA; FOLLOWS SIMPLE COMMANDS. CANNOT NAME. NO INTELLIGIBLE SPEECH.  CRANIAL NERVE: pupils equal and reactive to light, visual fields full to confrontation, extraocular muscles intact, no nystagmus, facial sensation symm, DECR RIGHT LOWER FACIAL STRENGTH. Uvula midline, shoulder shrug ABSENT ON RIGHT. Tongue DEVIATES TO RIGHT. MOTOR: RUE 0, RLE 1-2/5.  SENSORY: normal and symmetric to light touch COORDINATION: NOT FOLLOWING COMMANDS. REFLEXES: deep tendon reflexes present and symmetric; RIGHT TOE UPGOING.  ASSESSMENT Frank Weeks is a 79 y.o. male presenting with acute onset right hemiparesis, right facial weakness, and dysarthria. Imaging confirms a left basal ganglia infarct. Infarct felt to be embolic secondary to known atrial fibrillation, CT angio confirms M1 embolus. On aspirin 81 mg orally every day prior to admission. Now on aspirin 325 mg orally every day and heparin whiich was started last night from cardiology recommendations for secondary stroke prevention. Patient with  resultant dysphagia, left hemiparesis.   Remote history of subarachnoid hemorrhage 50 years ago presumably aneurysm negative as patient was treated conservatively. CT angiogram shows no evidence of aneurysm.   Hx paroxsymal atrial fibrillation. Dr. Graciela Husbands had recommended though patient refused coumadin,  as well as had reported difficulty gait, balance and falls. After attending discussion with cardiology, pt was started on full dose IV heparin last night. 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 Patient has a 10-15% risk of having another stroke over the next year, the highest risk is within 2 weeks of the most recent stroke/TIA (risk of having a stroke following a stroke or TIA is the same). Ongoing risk factor control by Primary Care Physician Stroke Service will sign off. Please call should any needs arise. SNF at time of discharge  Follow up with Dr. Pearlean Brownie, Stroke Clinic, in 2 months.  Annie Main, MSN, RN, ANVP-BC, ANP-BC, [facility] Pager: [phone] 03/12/2013 10:02 AM  I have personally examined this patient, reviewed chart, developed plan of care and agree with above. Delia Heady, MD

## 2013-03-12 NOTE — Progress Notes (Signed)
Clinical Social Work Department CLINICAL SOCIAL WORK PLACEMENT NOTE 03/12/2013  Patient:  EMMITTE, SURGEON  Account Number:  [id number] Admit date:  03/08/2013  Clinical Social Worker:  Robin Searing  Date/time:  03/12/2013 03:54 PM  Clinical Social Work is seeking post-discharge placement for this patient at the following level of care:   SKILLED NURSING   (*CSW will update this form in Epic as items are completed)   03/12/2013  Patient/family provided with [facility] Department of Clinical Social Work's list of facilities offering this level of care within the geographic area requested by the patient (or if unable, by the patient's family).  03/12/2013  Patient/family informed of their freedom to choose among providers that offer the needed level of care, that participate in Medicare, Medicaid or managed care program needed by the patient, have an available bed and are willing to accept the patient.  03/12/2013  Patient/family informed of [facility]' ownership interest in [facility], as well as of the fact that they are under no obligation to receive care at this facility.  PASARR submitted to EDS on 03/12/2013 PASARR number received from EDS on 03/12/2013  FL2 transmitted to all facilities in geographic area requested by pt/family on  03/12/2013 FL2 transmitted to all facilities within larger geographic area on   Patient informed that his/her managed care company has contracts with or will negotiate with  certain facilities, including the following:     Patient/family informed of bed offers received:   Patient chooses bed at  Physician recommends and patient chooses bed at    Patient to be transferred to  on   Patient to be transferred to facility by   The following physician request were entered in Epic:   Additional Comments: Reece Levy, MSW, Theresia Majors [phone]

## 2013-03-12 NOTE — Progress Notes (Signed)
Family has selected [facility] as their preference for SNF bed at this time- working with [employer] for [employer] and will f/u tomorrow to finalize these plans- Wife has voiced interest in a SNF near their home in [facility] which I am investigating - will f/u to further plans tomorrow a.m. Reece Levy, MSW, Theresia Majors [phone]

## 2013-03-12 NOTE — Progress Notes (Signed)
Speech Language Pathology Treatment Patient Details Name: RALLY OUCH MRN: [id number] DOB: 1929/04/04 Today's Date: 03/12/2013 Time: 4098-1191 SLP Time Calculation (min): 25 min  Assessment / Plan / Recommendation Clinical Impression  Treatment session focused on functional communication, automatic speech tasks and dysphagia goals. Pt observed with nectar thick liquids via straw and required Min verbal cues for utilization of small sips. Pt declined trials of textures due to just finishing lunch, however, required Min verbal and question cues to clear minimal right buccal pocketing. Pt answered yes/no questions appropriately and produced /m/ and all vowel sounds appropriately with max A verbal, visual and tactile cues and able to produce CV X 1 attempt with "me." Pt also able to demonstrate approximation and appropriate intonation with automatic tasks of counting and days of the week but unable to produce initial phoneme during task ("on, wo, ee, our, etc.). Pt's wife also reported he is able to approximate social responses at times.     SLP Plan  Continue with current plan of care    Pertinent Vitals/Pain No/Denies Pain  SLP Goals  SLP Goals Potential to Achieve Goals: Good  General Temperature Spikes Noted: No Respiratory Status: Room air Behavior/Cognition: Alert;Cooperative;Pleasant mood Oral Cavity - Dentition: Adequate natural dentition Patient Positioning: Upright in bed  Treatment Treatment focused on: Aphasia;Patient/family/caregiver education Family/Caregiver Educated: wife   GO     Feliberto Gottron 03/12/2013, 2:37 PM  Feliberto Gottron, [place], CCC-SLP [phone]

## 2013-03-12 NOTE — Progress Notes (Signed)
Clinical Social Work Department BRIEF PSYCHOSOCIAL ASSESSMENT 03/12/2013  Patient:  RIVAAN, KENDALL     Account Number:  [id number]     Admit date:  03/08/2013  Clinical Social Worker:  Robin Searing  Date/Time:  03/12/2013 03:49 PM  Referred by:  Physician  Date Referred:  03/12/2013 Referred for  SNF Placement   Other Referral:   Interview type:  Other - See comment Other interview type:   Met with patient and his wife, son and daughter    PSYCHOSOCIAL DATA Living Status:  FAMILY Admitted from facility:   Level of care:   Primary support name:  spouse- Dot [phone] Primary support relationship to patient:  FAMILY Degree of support available:   good    CURRENT CONCERNS Current Concerns  Post-Acute Placement   Other Concerns:    SOCIAL WORK ASSESSMENT / PLAN Patient admitted from home where he lives with his wife (in [place], [place]).  Plans are for SNF at d/c- everyone is in agreement with this plan-   Assessment/plan status:  Other - See comment Other assessment/ plan:   FL2 and Pasarr completion   Information/referral to community resources:   SNF  EMS    PATIENT'S/FAMILY'S RESPONSE TO PLAN OF CARE: Patient and family agreeable to plans for SNF search- Will advise as offers are rec'd.       Reece Levy, MSW, Theresia Majors [phone]

## 2013-03-12 NOTE — Progress Notes (Signed)
Physical Therapy Treatment Patient Details Name: Frank Weeks MRN: [id number] DOB: 1929-03-14 Today's Date: 03/12/2013 Time: 6387-5643 PT Time Calculation (min): 50 min  PT Assessment / Plan / Recommendation Comments on Treatment Session  Pt with acute right hemiparesis, right facial weakness and expressive aphasia. Pt tolerated increased bed mobility and midline perception training. +2 stand pivot transfer from bed to bedside commode, but pt unable to have bowel movement. Pt urged to sit up in chair after treatment, but was adament about returning to bed. Acute PT to maximize functional independence and saftey for d/c to next venue.    Follow Up Recommendations  CIR     Does the patient have the potential to tolerate intense rehabilitation   yes           Frequency Min 4X/week   Plan Discharge plan remains appropriate    Precautions / Restrictions Precautions Precautions: Fall Precaution Comments: expressive aphasia   Pertinent Vitals/Pain Pt denies pain    Mobility  Bed Mobility Bed Mobility: Rolling Right;Rolling Left;Right Sidelying to Sit;Sitting - Scoot to Edge of Bed;Sit to Supine Rolling Right: 4: Min guard;With rail (performed x3 for technique) Rolling Left: 3: Mod assist (performed x2 for technique) Right Sidelying to Sit: 3: Mod assist;With rails;HOB flat Sitting - Scoot to Edge of Bed: 3: Mod assist Sit to Supine: 2: Max assist Details for Bed Mobility Assistance: v/c and t/c for pt to use left LE to assit right LE when performing sidelying to sit and sit to sidelying. Transfers Transfers: Editor, commissioning Transfers: 1: +2 Total assist Stand Pivot Transfers: Patient Percentage: 50% Details for Transfer Assistance: stand pivot from bed to bedside commode and back. Facilitation provided for hip extension and upright trunk. Pt requiring max assist to bring weight to left side during stance. Pt able to assist pivot action with left  LE. Ambulation/Gait Ambulation/Gait Assistance: Not tested (comment) Modified Rankin (Stroke Patients Only) Modified Rankin: Severe disability    Exercises General Exercises - Upper Extremity Shoulder Flexion: PROM;Right;5 reps;Supine (AROM left shoulder x 5) Shoulder ABduction: PROM;Right;5 reps;Supine Elbow Flexion: PROM;Right;5 reps;Supine (AROM left x5) Elbow Extension: PROM;Right;5 reps;Supine (AROM left x5) Wrist Flexion: PROM;Right;5 reps;Supine Wrist Extension: PROM;Right;5 reps;Supine General Exercises - Lower Extremity Ankle Circles/Pumps: PROM;Right;Supine;10 reps Heel Slides: PROM;Right;10 reps;Supine   PT Goals Acute Rehab PT Goals PT Goal: Supine/Side to Sit - Progress: Progressing toward goal PT Goal: Sit at Edge Of Bed - Progress: Progressing toward goal PT Goal: Sit to Stand - Progress: Progressing toward goal PT Transfer Goal: Bed to Chair/Chair to Bed - Progress: Progressing toward goal PT Goal: Stand - Progress: Progressing toward goal PT Goal: Ambulate - Progress: Progressing toward goal  Visit Information  Last PT Received On: 03/12/13 Assistance Needed: +2    Subjective Data  Subjective: Pt using head nods and minor facial expressions indicating he did not want to sit up in the chair. Patient Stated Goal: not stated   Cognition  Cognition Arousal/Alertness: Awake/alert Behavior During Therapy: WFL for tasks assessed/performed Overall Cognitive Status: Difficult to assess Area of Impairment: Problem solving;Awareness Following Commands: Follows one step commands with increased time General Comments: Pt with appropriate yes/no responses throughout treatment. Facial expressions situationally appropriate throughout treatment. 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 Requiring intermittent min/mod assist to prevent fall due to LOB. EOB 5 minutes. Dynamic Sitting Balance Dynamic Sitting - Balance Support: Feet supported;No upper extremity supported Dynamic Sitting - Level of Assistance: 5: Stand by assistance;4: Min assist;3: Mod assist Dynamic Sitting Balance - Compensations: Sitting in chair, pt leaned to the left by therapist and then asked to correct back to midline. Pt able to correct with 80% accuracy with only v/c. Pt also sitting EOB and instructed to look around the room for objects while maintaining proper midline allignment. Pt requiring min/mod assist intermittently due to LOB. Stand by assit 50% of this time. EOB 8 minutes   End of Session PT - End of Session Equipment Utilized During Treatment: Gait belt Activity Tolerance: Patient tolerated treatment well Patient left: in bed;with call bell/phone within reach;with bed alarm set;with nursing in room Nurse Communication: Mobility status   GP     Payton Doughty 03/12/2013, 11:21 AM Payton Doughty, PT Student

## 2013-03-12 NOTE — Progress Notes (Signed)
ELECTROPHYSIOLOGY ROUNDING NOTE    Patient Name: Frank Weeks Date of Encounter: 03-12-2013    SUBJECTIVE:Patient with expressive aphasia.  Admitted as code stroke 03-08-2013.  Apixiban initiated this admission.   TELEMETRY: Reviewed telemetry pt in atrial fibrillation with ventricular pacing Filed Vitals:   03/11/13 1300 03/11/13 1757 03/11/13 2205 03/12/13 0635  BP: 106/68 132/78 123/90 126/78  Pulse: 66 86 84 70  Temp: 98.3 F (36.8 C) 97.5 F (36.4 C) 98.3 F (36.8 C) 98.2 F (36.8 C)  TempSrc: Oral Oral Oral Oral  Resp: 18 20 20 18   Height:      Weight:      SpO2: 96% 100% 98% 97%    Intake/Output Summary (Last 24 hours) at 03/12/13 0709 Last data filed at 03/12/13 0640  Gross per 24 hour  Intake    900 ml  Output   3275 ml  Net  -2375 ml    LABS: Basic Metabolic Panel:  Recent Labs  04/54/09 0440  NA 135  K 3.9  CL 103  CO2 22  GLUCOSE 107*  BUN 12  CREATININE 0.75  CALCIUM 8.9   CBC:  Recent Labs  03/11/13 0600 03/12/13 0540  WBC 9.7 11.1*  HGB 13.0 13.2  HCT 38.6* 37.9*  MCV 94.8 93.6  PLT 163 185    Radiology/Studies:  Ct Angio Head W/cm &/or Wo Cm 03/10/2013   *RADIOLOGY REPORT*  Clinical Data:  Stroke.  Right-sided weakness and expressive aphasia.  Pacemaker  CT ANGIOGRAPHY HEAD  Technique:  Multidetector CT imaging of the head was performed using the standard protocol during bolus administration of intravenous contrast.  Multiplanar CT image reconstructions including MIPs were obtained to evaluate the vascular anatomy.  Contrast: 80mL OMNIPAQUE IOHEXOL 350 MG/ML SOLN  Comparison:  CT head 03/09/2013  Findings:  Progression of ill-defined hypodensity in the left basal ganglia consistent with acute infarct.  This involves essentially all of the putamen.  The left globus pallidus may also be involved. No other acute infarct or hemorrhage.  There is generalized atrophy and chronic microvascular ischemia in the white matter.  Chronic infarct  right cerebellum is unchanged.  Both vertebral arteries are patent to the basilar.  There is mild atherosclerotic disease in the distal basilar bilaterally.  Right PICA is widely patent.  Left PICA not visualized.  PICA and superior cerebellar arteries are patent bilaterally.  Posterior cerebral arteries are patent bilaterally.  Right cavernous carotid is patent with atherosclerotic disease and mild stenosis.  Right anterior and  middle cerebral arteries are patent bilaterally.  Left cavernous carotid shows mild atherosclerotic disease and mild stenosis.  There is filling defect in the left M1 segment compatible with thrombus or embolus.  The patient does have atrial fibrillation suggesting   embolus.  This is nonobstructing and there is flow in left middle cerebral artery branches which appear normal.  Left anterior cerebral artery is widely patent.  Negative for cerebral aneurysm.   Review of the MIP images confirms the above findings.  IMPRESSION: Filling defect throughout the left M1 segment compatible with an embolus causing acute infarct in the left basal ganglia.  Mild atherosclerotic disease in the distal vertebral arteries and the cavernous carotid artery bilaterally.  Negative for aneurysm.   Original Report Authenticated By: Janeece Riggers, M.D.   Dg Chest 2 View 03/09/2013   *RADIOLOGY REPORT*  Clinical Data: CVA.  CHEST - 2 VIEW  Comparison:  03/08/2013.  Findings: The heart is enlarged but stable.  Stable  pacer wires. Persistent vascular congestion and interstitial edema along with a left pleural effusion and left lower lobe atelectasis.  IMPRESSION: Persistent cardiac enlargement, CHF, left pleural effusion and left lower lobe atelectasis.   Original Report Authenticated By: Rudie Meyer, M.D.   PHYSICAL EXAM aphasis and hemiparetic   Active Problems:   HYPERTENSION, UNSPECIFIED   CAD, ARTERY BYPASS GRAFT   Atrioventricular block, complete   Atrial fibrillation   Subarachnoid hemorrhage    Hemiparesis   Atrial flutter   i sat with him for a few minutes  So sad and he upset, understandably.   Little to add

## 2013-03-12 NOTE — Progress Notes (Addendum)
[facility] PROGRESS NOTE  Frank Weeks:[id number] DOB: [birth year] DOA: 03/08/2013 PCP: Ezequiel Kayser, MD  Assessment/Plan: CVA - suspect L basal ganglia/external capsule  - Patient is continued on Eliquis.  - cleared for D1 nectar diet per SLP  CAD s/p CABG 2007  cotninue ASA/Statin. Stable  PVD s/p R CEA  No signif stenosis noted via dopplers  H/o Parox afib-  -Cardiology consuled -Continue anticoagulation as per above HTN:  Stable - cont to hold lisinopril for now to avoid potential relative hypotension  S/p pacemaker due to second degree heart block and bradycardia  HLD  On oral agent  Scabies  -Status post permethrin cream Constipation  -Abdomen is mildly distended and tympanic. Patient claims to have not moved his bowels in at least several days we'll continue with aggressive bowel regimen.      Code Status: Full code Family Communication: Patient in room (indicate person spoken with, relationship, and if by phone, the number) Disposition Plan: Pending   Consultants:  Neurology  Cardiology  Procedures: 5/25 - carotid dopplers - no significant ICA stenosis - right CEA appears patent - vertebral artery flow is antegrade  5/26 - TTE - unremarkable  Antibiotics:  Permethrin cream ended on 03/11/2013  HPI/Subjective: Patient is without overnight events  Objective: Filed Vitals:   03/11/13 1300 03/11/13 1757 03/11/13 2205 03/12/13 0635  BP: 106/68 132/78 123/90 126/78  Pulse: 66 86 84 70  Temp: 98.3 F (36.8 C) 97.5 F (36.4 C) 98.3 F (36.8 C) 98.2 F (36.8 C)  TempSrc: Oral Oral Oral Oral  Resp: 18 20 20 18   Height:      Weight:      SpO2: 96% 100% 98% 97%    Intake/Output Summary (Last 24 hours) at 03/12/13 0857 Last data filed at 03/12/13 0640  Gross per 24 hour  Intake    900 ml  Output   3275 ml  Net  -2375 ml   Filed Weights   03/09/13 2100  Weight: 74.844 kg (165 lb)    Exam:   General:  Patient awake and  apparent  Cardiovascular: Regular, S1-S2  Respiratory: Normal respiratory effort, no crackles no wheeze  Abdomen: Soft, positive bowel sound  Musculoskeletal: Perfused distally, no clubbing or cyanosis   Data Reviewed: Basic Metabolic Panel:  Recent Labs Lab 03/08/13 0925 03/08/13 0931 03/10/13 0440  NA 137 142 135  K 4.5 4.5 3.9  CL 102 106 103  CO2 25  --  22  GLUCOSE 100* 98 107*  BUN 15 17 12   CREATININE 0.96 0.90 0.75  CALCIUM 9.5  --  8.9   Liver Function Tests:  Recent Labs Lab 03/08/13 0925  AST 21  ALT 18  ALKPHOS 93  BILITOT 0.4  PROT 8.5*  ALBUMIN 3.6   No results found for this basename: LIPASE, AMYLASE,  in the last 168 hours No results found for this basename: AMMONIA,  in the last 168 hours CBC:  Recent Labs Lab 03/08/13 0925 03/08/13 0931 03/10/13 0440 03/11/13 0600 03/12/13 0540  WBC 9.9  --  9.7 9.7 11.1*  NEUTROABS 7.2  --   --   --   --   HGB 14.1 15.0 13.1 13.0 13.2  HCT 41.6 44.0 38.6* 38.6* 37.9*  MCV 96.7  --  95.3 94.8 93.6  PLT 185  --  176 163 185   Cardiac Enzymes:  Recent Labs Lab 03/08/13 0925  TROPONINI <0.30   BNP (last 3 results) No results found for  this basename: PROBNP,  in the last 8760 hours CBG:  Recent Labs Lab 03/08/13 0950 03/09/13 0733 03/09/13 1146 03/09/13 1534  GLUCAP 93 110* 125* 138*    Recent Results (from the past 240 hour(s))  MRSA PCR SCREENING     Status: None   Collection Time    03/08/13  3:13 PM      Result Value Range Status   MRSA by PCR NEGATIVE  NEGATIVE Final   Comment:            The GeneXpert MRSA Assay (FDA     approved for NASAL specimens     only), is one component of a     comprehensive MRSA colonization     surveillance program. It is not     intended to diagnose MRSA     infection nor to guide or     monitor treatment for     MRSA infections.     Studies: Ct Angio Head W/cm &/or Wo Cm  03/10/2013   *RADIOLOGY REPORT*  Clinical Data:  Stroke.   Right-sided weakness and expressive aphasia.  Pacemaker  CT ANGIOGRAPHY HEAD  Technique:  Multidetector CT imaging of the head was performed using the standard protocol during bolus administration of intravenous contrast.  Multiplanar CT image reconstructions including MIPs were obtained to evaluate the vascular anatomy.  Contrast: 80mL OMNIPAQUE IOHEXOL 350 MG/ML SOLN  Comparison:  CT head 03/09/2013  Findings:  Progression of ill-defined hypodensity in the left basal ganglia consistent with acute infarct.  This involves essentially all of the putamen.  The left globus pallidus may also be involved. No other acute infarct or hemorrhage.  There is generalized atrophy and chronic microvascular ischemia in the white matter.  Chronic infarct right cerebellum is unchanged.  Both vertebral arteries are patent to the basilar.  There is mild atherosclerotic disease in the distal basilar bilaterally.  Right PICA is widely patent.  Left PICA not visualized.  PICA and superior cerebellar arteries are patent bilaterally.  Posterior cerebral arteries are patent bilaterally.  Right cavernous carotid is patent with atherosclerotic disease and mild stenosis.  Right anterior and  middle cerebral arteries are patent bilaterally.  Left cavernous carotid shows mild atherosclerotic disease and mild stenosis.  There is filling defect in the left M1 segment compatible with thrombus or embolus.  The patient does have atrial fibrillation suggesting   embolus.  This is nonobstructing and there is flow in left middle cerebral artery branches which appear normal.  Left anterior cerebral artery is widely patent.  Negative for cerebral aneurysm.   Review of the MIP images confirms the above findings.  IMPRESSION: Filling defect throughout the left M1 segment compatible with an embolus causing acute infarct in the left basal ganglia.  Mild atherosclerotic disease in the distal vertebral arteries and the cavernous carotid artery bilaterally.   Negative for aneurysm.   Original Report Authenticated By: Janeece Riggers, M.D.   Dg Chest Port 1 View  03/11/2013   *RADIOLOGY REPORT*  Clinical Data: Rule out aspiration  PORTABLE CHEST - 1 VIEW  Comparison: Chest radiograph 03/09/2013  Findings: Left-sided pacemaker overlies normal cardiac silhouette. There is a left pleural effusion not changed on paired to prior. There is atelectasis at the left  lung base which also unchanged. Right lung is relatively clear.  No pneumothorax  IMPRESSION: 1.  No significant change. 2.  Left lower lobe atelectasis and effusion.   Original Report Authenticated By: Genevive Bi, M.D.    Scheduled  Meds: . antiseptic oral rinse  15 mL Mouth Rinse q12n4p  . apixaban  5 mg Oral BID  . chlorhexidine  15 mL Mouth Rinse BID  . pneumococcal 23 valent vaccine  0.5 mL Intramuscular Tomorrow-1000  . rosuvastatin  10 mg Oral q1800  . Travoprost (BAK Free)  1 drop Both Eyes QHS   Continuous Infusions: . sodium chloride 50 mL/hr at 03/12/13 1610    Active Problems:   HYPERTENSION, UNSPECIFIED   CAD, ARTERY BYPASS GRAFT   Atrioventricular block, complete   Atrial fibrillation   Subarachnoid hemorrhage   Hemiparesis   Atrial flutter    Time spent: 20 minutes    Yanina Knupp K  [facility] Pager [phone]. If 7PM-7AM, please contact night-coverage at [email or web address], password [facility] 03/12/2013, 8:57 AM  LOS: 4 days

## 2013-03-12 NOTE — Progress Notes (Signed)
I have read and agree with the below treatment note and plan. Brita Jurgensen Helen Whitlow PT, DPT Pager: [phone] 

## 2013-03-12 NOTE — Progress Notes (Signed)
Occupational Therapy Treatment Patient Details Name: Frank Weeks MRN: [id number] DOB: 06/23/1929 Today's Date: 03/12/2013 Time: 4098-1191 OT Time Calculation (min): 58 min  OT Assessment / Plan / Recommendation Comments on Treatment Session   Pt admitted with dense R hemiplegia. 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 Pt has old infarcts and a significant cardiac hx.  CT positive for embolus causing acute infarct in the left basal ganglia. Session focused on transfers to help progress patient toward a wheelchair level of care. Family (wife daughter son) all educated on session and focus of OT goals during acute stay.    Follow Up Recommendations  CIR;Other (comment) (family to pursue SNF)    Barriers to Discharge       Equipment Recommendations  3 in 1 bedside comode;Wheelchair (measurements OT);Wheelchair cushion (measurements OT)    Recommendations for Other Services Rehab consult  Frequency Min 3X/week   Plan Discharge plan remains appropriate    Precautions / Restrictions Precautions Precautions: Fall Precaution Comments: expressive aphasia   Pertinent Vitals/Pain Pain with prolonged sitting    ADL  Grooming: Wash/dry face;Set up Where Assessed - Grooming: Supported sitting Equipment Used: Gait belt Transfers/Ambulation Related to ADLs: session focused on bed mobiilty and EOB <>chair <>EOB. pt not wanting to remain in chair due to pain at belly and right side. Difficult to fully assess due to aphasia. Pt required total +2 (A) for transfers with Rt LE buckling. pt demonstrates hyperextension at Rt knee during session as well. Pt initiates steps with Lt Le and facilitate weight shift. Pt implusive with transfers needing max cues ADL Comments: Pt supine on arrival. pt attempting to verbalize throughout session. pt said YeS and "I am ready to go walk to ?" OT was unable to determine last word in statement with multiple attempts. pt encouraged to use YES NO and thumbs up and down during session. Pt with  LOB when not using LT UE support.     OT Diagnosis:    OT Problem List:   OT Treatment Interventions:     OT Goals Acute Rehab OT Goals OT Goal Formulation: With patient Time For Goal Achievement: 03/23/13 Potential to Achieve Goals: Good ADL Goals Pt Will Perform Eating: with supervision;Supported;Sitting, chair Pt Will Perform Grooming: Sitting, chair;with set-up ADL Goal: Grooming - Progress: Progressing toward goals Pt Will Transfer to Toilet: with mod assist;with DME;Stand pivot transfer ADL Goal: Toilet Transfer - Progress: Progressing toward goals Miscellaneous OT Goals Miscellaneous OT Goal #1: Pt will scan R visual field to locate items on plate or tray during ADL without cues. OT Goal: Miscellaneous Goal #1 - Progress: Progressing toward goals Miscellaneous OT Goal #2: Pt will sit with supervision at EOB in prep for ADL. Miscellaneous OT Goal #3: Pt/family will perform P/AAROM R UE independently. Miscellaneous OT Goal #4: Pt will protect R UE during mobility/transfers with minimal reminders. Miscellaneous OT Goal #5: Pt will perform bed mobility with min assist in prep for ADL at EOB. OT Goal: Miscellaneous Goal #5 - Progress: Progressing toward goals  Visit Information  Last OT Received On: 03/12/13 Assistance Needed: +2    Subjective Data      Prior Functioning       Cognition  Cognition Arousal/Alertness: Awake/alert Behavior During Therapy: WFL for tasks assessed/performed Overall Cognitive Status: Difficult to assess General Comments: pt responds correctly to questions and smiling during this session. pt uses yes no response correctly > 75 % of session Difficult to assess due to: Impaired communication    Mobility  Bed Mobility Bed Mobility: Rolling Right;Right Sidelying to Sit;Supine to Sit Rolling Right: 4: Min guard;With rail Right Sidelying to Sit: 3: Mod assist;With rails;HOB flat Supine to Sit: 3: Mod assist;With rails Sitting - Scoot to Edge of  Bed: 3: Mod assist Sit to Supine: 2: Max assist;HOB flat;With rail (Needed (A) with BIL LE) Details for Bed Mobility Assistance: Pt needed cues to sequence task and extended time. Pt using LT LE to lift RT LE during session with cues. Pt using gravity for sit<>Supine and needs extended practice to control descend Transfers Transfers: Sit to Stand;Stand to Sit Sit to Stand: 1: +2 Total assist;With upper extremity assist;From bed Sit to Stand: Patient Percentage: 40% Stand to Sit: 1: +2 Total assist;With upper extremity assist;To bed Stand to Sit: Patient Percentage: 40% Details for Transfer Assistance: pushing with Lt UE and attempting to initiate transfer prematurely with cues to weight shift first.     Exercises      Balance     End of Session OT - End of Session Activity Tolerance: Patient tolerated treatment well Patient left: in chair;with call bell/phone within reach Nurse Communication: Mobility status;Precautions  GO     Lucile Shutters 03/12/2013, 4:39 PM Pager: [phone]

## 2013-03-13 ENCOUNTER — Encounter (HOSPITAL_COMMUNITY): Payer: Self-pay | Admitting: *Deleted

## 2013-03-13 ENCOUNTER — Inpatient Hospital Stay (HOSPITAL_COMMUNITY)
Admission: RE | Admit: 2013-03-13 | Discharge: 2013-04-06 | DRG: 945 | Disposition: A | Discharge status: Skilled Nursing Facility | Payer: Medicare Other | Source: Intra-hospital | Attending: Physical Medicine & Rehabilitation | Admitting: Physical Medicine & Rehabilitation

## 2013-03-13 DIAGNOSIS — Z951 Presence of aortocoronary bypass graft: Secondary | ICD-10-CM | POA: Diagnosis not present

## 2013-03-13 DIAGNOSIS — M19079 Primary osteoarthritis, unspecified ankle and foot: Secondary | ICD-10-CM | POA: Diagnosis not present

## 2013-03-13 DIAGNOSIS — I4891 Unspecified atrial fibrillation: Secondary | ICD-10-CM | POA: Diagnosis not present

## 2013-03-13 DIAGNOSIS — R29898 Other symptoms and signs involving the musculoskeletal system: Secondary | ICD-10-CM | POA: Diagnosis not present

## 2013-03-13 DIAGNOSIS — R4789 Other speech disturbances: Secondary | ICD-10-CM

## 2013-03-13 DIAGNOSIS — Z8249 Family history of ischemic heart disease and other diseases of the circulatory system: Secondary | ICD-10-CM

## 2013-03-13 DIAGNOSIS — I2510 Atherosclerotic heart disease of native coronary artery without angina pectoris: Secondary | ICD-10-CM | POA: Diagnosis not present

## 2013-03-13 DIAGNOSIS — F4323 Adjustment disorder with mixed anxiety and depressed mood: Secondary | ICD-10-CM | POA: Diagnosis not present

## 2013-03-13 DIAGNOSIS — K219 Gastro-esophageal reflux disease without esophagitis: Secondary | ICD-10-CM | POA: Diagnosis not present

## 2013-03-13 DIAGNOSIS — R5381 Other malaise: Secondary | ICD-10-CM | POA: Diagnosis not present

## 2013-03-13 DIAGNOSIS — H409 Unspecified glaucoma: Secondary | ICD-10-CM

## 2013-03-13 DIAGNOSIS — I672 Cerebral atherosclerosis: Secondary | ICD-10-CM | POA: Diagnosis not present

## 2013-03-13 DIAGNOSIS — Z8673 Personal history of transient ischemic attack (TIA), and cerebral infarction without residual deficits: Secondary | ICD-10-CM

## 2013-03-13 DIAGNOSIS — K589 Irritable bowel syndrome without diarrhea: Secondary | ICD-10-CM

## 2013-03-13 DIAGNOSIS — Z950 Presence of cardiac pacemaker: Secondary | ICD-10-CM

## 2013-03-13 DIAGNOSIS — Z7901 Long term (current) use of anticoagulants: Secondary | ICD-10-CM | POA: Diagnosis not present

## 2013-03-13 DIAGNOSIS — R4701 Aphasia: Secondary | ICD-10-CM

## 2013-03-13 DIAGNOSIS — E785 Hyperlipidemia, unspecified: Secondary | ICD-10-CM

## 2013-03-13 DIAGNOSIS — Z87891 Personal history of nicotine dependence: Secondary | ICD-10-CM | POA: Diagnosis not present

## 2013-03-13 DIAGNOSIS — R1310 Dysphagia, unspecified: Secondary | ICD-10-CM | POA: Diagnosis not present

## 2013-03-13 DIAGNOSIS — N390 Urinary tract infection, site not specified: Secondary | ICD-10-CM

## 2013-03-13 DIAGNOSIS — I739 Peripheral vascular disease, unspecified: Secondary | ICD-10-CM | POA: Diagnosis not present

## 2013-03-13 DIAGNOSIS — I10 Essential (primary) hypertension: Secondary | ICD-10-CM | POA: Diagnosis not present

## 2013-03-13 DIAGNOSIS — Z79899 Other long term (current) drug therapy: Secondary | ICD-10-CM | POA: Diagnosis not present

## 2013-03-13 DIAGNOSIS — Z5189 Encounter for other specified aftercare: Secondary | ICD-10-CM | POA: Diagnosis present

## 2013-03-13 DIAGNOSIS — B370 Candidal stomatitis: Secondary | ICD-10-CM | POA: Diagnosis not present

## 2013-03-13 DIAGNOSIS — I6340 Cerebral infarction due to embolism of unspecified cerebral artery: Secondary | ICD-10-CM

## 2013-03-13 DIAGNOSIS — Z801 Family history of malignant neoplasm of trachea, bronchus and lung: Secondary | ICD-10-CM

## 2013-03-13 LAB — CBC
Hemoglobin: 13.3 g/dL (ref 13.0–17.0)
MCH: 32.8 pg (ref 26.0–34.0)
MCHC: 35 g/dL (ref 30.0–36.0)
MCV: 93.6 fL (ref 78.0–100.0)

## 2013-03-13 MED ORDER — LUBIPROSTONE 24 MCG PO CAPS
24.0000 ug | ORAL_CAPSULE | Freq: Two times a day (BID) | ORAL | Status: DC
  Administered 2013-03-14 – 2013-04-06 (×47): 24 ug via ORAL
  Filled 2013-03-13 (×49): qty 1

## 2013-03-13 MED ORDER — ONDANSETRON HCL 4 MG PO TABS: 4.0000 mg | ORAL_TABLET | Freq: Four times a day (QID) | ORAL | Status: DC | PRN

## 2013-03-13 MED ORDER — ONDANSETRON HCL 4 MG/2ML IJ SOLN: 4.0000 mg | Freq: Four times a day (QID) | INTRAMUSCULAR | Status: DC | PRN

## 2013-03-13 MED ORDER — APIXABAN 5 MG PO TABS: 5.0000 mg | ORAL_TABLET | Freq: Two times a day (BID) | ORAL | Status: DC

## 2013-03-13 MED ORDER — ROSUVASTATIN CALCIUM 10 MG PO TABS
10.0000 mg | ORAL_TABLET | Freq: Every day | ORAL | Status: DC
  Administered 2013-03-13 – 2013-04-05 (×24): 10 mg via ORAL
  Filled 2013-03-13 (×25): qty 1

## 2013-03-13 MED ORDER — BIOTENE DRY MOUTH MT LIQD: 15.0000 mL | Freq: Two times a day (BID) | OROMUCOSAL | Status: DC

## 2013-03-13 MED ORDER — SENNOSIDES-DOCUSATE SODIUM 8.6-50 MG PO TABS: 1.0000 | ORAL_TABLET | Freq: Every evening | ORAL | Status: DC | PRN

## 2013-03-13 MED ORDER — LUBIPROSTONE 24 MCG PO CAPS: 24.0000 ug | ORAL_CAPSULE | Freq: Two times a day (BID) | ORAL | Status: DC

## 2013-03-13 MED ORDER — APIXABAN 5 MG PO TABS
5.0000 mg | ORAL_TABLET | Freq: Two times a day (BID) | ORAL | Status: DC
  Administered 2013-03-13 – 2013-04-06 (×48): 5 mg via ORAL
  Filled 2013-03-13 (×50): qty 1

## 2013-03-13 MED ORDER — SENNOSIDES-DOCUSATE SODIUM 8.6-50 MG PO TABS
1.0000 | ORAL_TABLET | Freq: Every evening | ORAL | Status: DC | PRN
  Administered 2013-03-19 – 2013-03-20 (×2): 1 via ORAL
  Filled 2013-03-13 (×2): qty 1

## 2013-03-13 MED ORDER — TRAVOPROST (BAK FREE) 0.004 % OP SOLN
1.0000 [drp] | Freq: Every day | OPHTHALMIC | Status: DC
  Administered 2013-03-13 – 2013-04-05 (×22): 1 [drp] via OPHTHALMIC
  Filled 2013-03-13: qty 2.5

## 2013-03-13 MED ORDER — BIOTENE DRY MOUTH MT LIQD
15.0000 mL | Freq: Two times a day (BID) | OROMUCOSAL | Status: DC
  Administered 2013-03-13 – 2013-04-05 (×41): 15 mL via OROMUCOSAL

## 2013-03-13 MED ORDER — ACETAMINOPHEN 325 MG PO TABS
325.0000 mg | ORAL_TABLET | ORAL | Status: DC | PRN
  Administered 2013-03-17: 650 mg via ORAL
  Administered 2013-03-17: 325 mg via ORAL
  Administered 2013-03-19 – 2013-04-02 (×3): 650 mg via ORAL
  Filled 2013-03-13 (×5): qty 2

## 2013-03-13 MED ORDER — POLYETHYLENE GLYCOL 3350 17 G PO PACK
17.0000 g | PACK | Freq: Every day | ORAL | Status: DC
  Administered 2013-03-14 – 2013-03-15 (×2): 17 g via ORAL
  Filled 2013-03-13 (×6): qty 1

## 2013-03-13 MED ORDER — POLYETHYLENE GLYCOL 3350 17 G PO PACK: 17.0000 g | PACK | Freq: Every day | ORAL | Status: DC

## 2013-03-13 MED ORDER — MAGNESIUM CITRATE PO SOLN
1.0000 | Freq: Once | ORAL | Status: AC
  Administered 2013-03-13: 1 via ORAL
  Filled 2013-03-13: qty 296

## 2013-03-13 MED ORDER — FLUCONAZOLE 100 MG PO TABS
100.0000 mg | ORAL_TABLET | Freq: Every day | ORAL | Status: AC
  Administered 2013-03-13 – 2013-03-15 (×3): 100 mg via ORAL
  Filled 2013-03-13 (×3): qty 1

## 2013-03-13 MED ORDER — LUBIPROSTONE 24 MCG PO CAPS
24.0000 ug | ORAL_CAPSULE | Freq: Two times a day (BID) | ORAL | Status: DC
  Administered 2013-03-13: 24 ug via ORAL
  Filled 2013-03-13 (×2): qty 1

## 2013-03-13 MED ORDER — RESOURCE THICKENUP CLEAR PO POWD
ORAL | Status: DC | PRN
  Filled 2013-03-13: qty 125

## 2013-03-13 MED ORDER — CHLORHEXIDINE GLUCONATE 0.12 % MT SOLN: 15.0000 mL | Freq: Two times a day (BID) | OROMUCOSAL | Status: DC

## 2013-03-13 NOTE — Progress Notes (Signed)
Family is being considered for CIR- they are awaiting word from [employer] and are hopeful for a decision soon- our alternate option is for SNF bed at [facility]- will await further word from CIR.  Reece Levy, MSW, Theresia Majors [phone]

## 2013-03-13 NOTE — Plan of Care (Signed)
Overall Plan of Care [facility]) Patient Details Name: Frank Weeks MRN: [id number] DOB: Dec 28, 1928  Diagnosis:  L MCA infarct  Co-morbidities: Chronic constipation, afib  Functional Problem List  Patient demonstrates impairments in the following areas: Balance, Bladder, Bowel, Cognition, Endurance, Linguistic, Medication Management, Motor, Nutrition, Pain, Perception, Safety, Sensory  and Skin Integrity  Basic ADL's: eating, grooming, bathing, dressing and toileting Advanced ADL's: N/A  Transfers:  bed mobility, bed to chair, toilet, tub/shower and car Locomotion:  ambulation and wheelchair mobility  Additional Impairments:  Functional use of upper extremity  Anticipated Outcomes Item Anticipated Outcome  Eating/Swallowing  Supervision/cues  Basic self-care  Supervision/min assist  Tolieting  Min assist  Bowel/Bladder  Continent with timed toileting  Transfers  S/Mod-Independent  Locomotion  S/Mod-Independent using LRAD x 30'  Communication  Min assist  Cognition  Min assist  Pain  3 or less on scale 0-10  Safety/Judgment    Other     Therapy Plan: PT Intensity: Minimum of 1-2 x/day ,45 to 90 minutes PT Frequency: 5 out of 7 days PT Duration Estimated Length of Stay: 4 weeks OT Intensity: Minimum of 1-2 x/day, 45 to 90 minutes OT Frequency: 5 out of 7 days OT Duration/Estimated Length of Stay: 4 weeks SLP Frequency: 5 out of 7 days SLP Duration/Estimated Length of Stay: 3 weeksST Intensity:  One time per day ST Frequency:  5 out of 7 days ST  Estimated length of stay:  3-4 weeks.   Team Interventions: Item RN PT OT SLP SW TR Other  Self Care/Advanced ADL Retraining   x      Neuromuscular Re-Education  x x      Therapeutic Activities  x x X     UE/LE Strength Training/ROM  x x      UE/LE Coordination Activities  x x      Visual/Perceptual Remediation/Compensation   x      DME/Adaptive Equipment Instruction  x x      Therapeutic Exercise  x x X      Balance/Vestibular Training  x x      Patient/Family Education x x x X     Cognitive Remediation/Compensation   x X     Functional Mobility Training  x x      Ambulation/Gait Training  x       Printmaker    X     Speech/Language Facilitation    X     Bladder Management x        Bowel Management x        Disease Management/Prevention         Pain Management x  x      Medication Management x        Skin Care/Wound Management x        Splinting/Orthotics         Discharge Planning   x      Psychosocial Support   x X                            Team Discharge Planning: Destination: PT-Home ,OT- Home (may need SNF as wife can only provide supervision) , SLP-HomeHome Projected Follow-up: PT-Home health PT,  OT-  Home health OT (may need SNF as pt's wife unable to provide physical assist), SLP-Home Health [facility] SLP Projected Equipment Needs: PT- , OT- 3 in 1 bedside comode;Tub/shower seat, SLP-None recommended by [facility] Patient/family involved in discharge planning: PT- Patient;Family member/caregiver,  OT-Patient;Family member/caregiver, SLP-Patient;Family member/caregiverPatient, family member  MD ELOS: 4 weeks Medical Rehab Prognosis:  Good Assessment: 79 yo male with afib admitted for Embolic left MCA distribution infarct causing R hemiparesis and aphasia.  Now requiring 24/7 rehab RN/MD, CIR level PT/OT/SLP.  Team with concentrate on  Communication, NM re education W/C level mobuility and ADLs.    See Team Conference Notes for weekly updates to the plan of care

## 2013-03-13 NOTE — Progress Notes (Signed)
Patient is ready for rehab, report called to [facility] RN on rehab.

## 2013-03-13 NOTE — Progress Notes (Signed)
Admitting patient to CIR today. Have received authorization from [facility] for CIR admission today. Called [employer] to inquire on policy regarding possible need for SNF after CIR. Was informed that the policy is that if the pt needs further rehabilitation post CIR, SNF would be approved. Informed pt's wife and son of above. They are aware of [employer]'s policy but that we cannot make any guarantee re: insurance. They agreed that they would like pt to come to come to CIR today rather than to SNF. Have informed Dr Rhona Leavens of plan. For questions: [phone]

## 2013-03-13 NOTE — Interval H&P Note (Signed)
Frank Weeks was admitted today to Inpatient Rehabilitation with the diagnosis of left basal ganglia/external capsule infarct, likely embolic.  The patient's history has been reviewed, patient examined, and there is no change in status.  Patient continues to be appropriate for intensive inpatient rehabilitation.  I have reviewed the patient's chart and labs.  Questions were answered to the patient's satisfaction.  [name] T 03/13/2013, 6:03 PM

## 2013-03-13 NOTE — H&P (Signed)
Physical Medicine and Rehabilitation Admission H&P    Chief Complaint  Patient presents with  . Code Stroke  : HPI: Frank Weeks is a 79 y.o. right-handed male with history of CAD/CABG, PAF pacemaker, subarachnoid hemorrhage 50 years ago CVA. Admitted 03/08/2013 with fall reported by wife with right-sided weakness and slurred speech. Cranial CT showed developing low density left basal ganglia external capsule region consistent with acute infarct. Carotid Dopplers with no ICA stenosis. Echocardiogram with ejection fraction of 60% and normal systolic function. CT angiogram of the head showed a filling defect throughout the left M1 segment compatible with embolus causing acute infarct in left basal ganglia. Patient did not receive TPA. Neurology services consulted and placed on Eliquis for stroke prophylaxis in the setting of PAF . Patient is on a dysphagia 2 nectar thick liquid followed by speech therapy. Physical and occupational therapy evaluations completed 03/09/2013 recommendations made for physical medicine rehabilitation consult. Patient was felt to be a candidate for inpatient rehabilitation services and was admitted for comprehensive rehabilitation program  Review of Systems  Cardiovascular: Positive for palpitations and leg swelling.  Gastrointestinal:       GERD  Musculoskeletal: Positive for myalgias.  Neurological: Positive for weakness.  All other systems reviewed and are negative.     Past Medical History  Diagnosis Date  . Coronary artery disease     a. 2007 CABGx3: LIMA->LAD, VG->RI, VG->RCA;  b. 2010 Aden MV: nonischemic.  . Bradycardia     a. s/p PPM.  . PVD (peripheral vascular disease)   . HTN (hypertension)   . Dyslipidemia   . Subarachnoid hemorrhage     a. aneurysmal 1964, s/p clipping.  . Cerebrovascular disease     a. s/p R CEA;  b. 02/2013 Carotid U/S: no signif ICA stenosis.  . GERD (gastroesophageal reflux disease)   . Stroke     a. first @ age 58->memory  deficits;  b. 02/2013 embolic stroke - left basal ganglia.  . Glaucoma   . Pleural effusion   . PAF (paroxysmal atrial fibrillation)     a. previously refused coumadin and felt to be poor coumadin candidate 2/2 falls/unsteady gait;  b. 02/2013 Echo: EF 55-60%, mild to mod AS.   Past Surgical History  Procedure Laterality Date  . Coronary artery bypass graft  03/18/2006  . Right carotid endarectomy  09/2004  . Left carotid to vertebral bypass    . Dual-chamber pacemaker implantation    . Thoracentesis and bronchoscopy     Family History  Problem Relation Age of Onset  . Heart attack Mother   . Lung cancer Mother   . Heart attack Father   . Tremor     Social History:  reports that he quit smoking about 62 years ago. He does not have any smokeless tobacco history on file. He reports that he does not drink alcohol. His drug history is not on file. Allergies:  Allergies  Allergen Reactions  . Atorvastatin Other (See Comments)    Joints sore.    Medications Prior to Admission  Medication Sig Dispense Refill  . aspirin EC 81 MG tablet Take 1 tablet (81 mg total) by mouth daily.      . co-enzyme Q-10 30 MG capsule Take 30 mg by mouth 2 (two) times daily.      . fish oil-omega-3 fatty acids 1000 MG capsule Take 2 g by mouth daily.        . nitroGLYCERIN (NITROSTAT) 0.4 MG SL tablet Place 0.4   mg under the tongue every 5 (five) minutes as needed for chest pain.      . permethrin (ELIMITE) 5 % cream Apply 1 application topically once. Took first treatment and needs second treatment on Wednesday.      . ramipril (ALTACE) 10 MG capsule Take 10 mg by mouth daily.        . rosuvastatin (CRESTOR) 10 MG tablet Take 10 mg by mouth. Take 1 tablet by mouth three times a week.       . travoprost, benzalkonium, (TRAVATAN) 0.004 % ophthalmic solution Place 1 drop into both eyes at bedtime.          Home: Home Living Lives With: Spouse Available Help at Discharge: Family;Available 24 hours/day (wife  can only provide supervision) Type of Home: House Home Access: Stairs to enter Entrance Stairs-Number of Steps: 1 Entrance Stairs-Rails: None Home Layout: Two level;Able to live on main level with bedroom/bathroom Bathroom Shower/Tub: Walk-in shower Bathroom Toilet:  (comfort height) Home Adaptive Equipment: Walker - standard   Functional History:    Functional Status:  Mobility: Bed Mobility Bed Mobility: Rolling Right;Right Sidelying to Sit;Supine to Sit Rolling Right: 4: Min guard;With rail Rolling Left: 3: Mod assist (performed x2 for technique) Right Sidelying to Sit: 3: Mod assist;With rails;HOB flat Left Sidelying to Sit: 1: +2 Total assist Left Sidelying to Sit: Patient Percentage: 40% Supine to Sit: 3: Mod assist;With rails Supine to Sit: Patient Percentage: 40% Sitting - Scoot to Edge of Bed: 3: Mod assist Sit to Supine: 2: Max assist;HOB flat;With rail (Needed (A) with BIL LE) Transfers Transfers: Stand Pivot Transfers Sit to Stand: 1: +2 Total assist;With upper extremity assist;From bed Sit to Stand: Patient Percentage: 40% Stand to Sit: 1: +2 Total assist;With upper extremity assist;To bed Stand to Sit: Patient Percentage: 40% Stand Pivot Transfers: 1: +2 Total assist Stand Pivot Transfers: Patient Percentage: 50% Squat Pivot Transfers: 1: +2 Total assist Squat Pivot Transfers: Patient Percentage: 50% Ambulation/Gait Ambulation/Gait Assistance: Not tested (comment)    ADL: ADL Grooming: Wash/dry face;Set up Where Assessed - Grooming: Supported sitting Upper Body Bathing: Maximal assistance Where Assessed - Upper Body Bathing: Supported sitting Lower Body Bathing: +1 Total assistance Where Assessed - Lower Body Bathing: Supported sitting;Supported sit to stand Upper Body Dressing: Maximal assistance Where Assessed - Upper Body Dressing: Supported sitting Lower Body Dressing: +1 Total assistance Where Assessed - Lower Body Dressing: Supported  sitting;Supported sit to stand Toilet Transfer: +2 Total assistance Toilet Transfer Method: Stand pivot Toilet Transfer Equipment: Raised toilet seat with arms (or 3-in-1 over toilet) Equipment Used: Gait belt Transfers/Ambulation Related to ADLs: session focused on bed mobiilty and EOB <>chair <>EOB. pt not wanting to remain in chair due to pain at belly and right side. Difficult to fully assess due to aphasia. Pt required total +2 (A) for transfers with Rt LE buckling. pt demonstrates hyperextension at Rt knee during session as well. Pt initiates steps with Lt Le and facilitate weight shift. Pt implusive with transfers needing max cues ADL Comments: Pt supine on arrival. pt attempting to verbalize throughout session. pt said YeS and "I am ready to go walk to ?" OT was unable to determine last word in statement with multiple attempts. pt encouraged to use YES NO and thumbs up and down during session. Pt with LOB when not using LT UE support.   Cognition: Cognition Overall Cognitive Status: Difficult to assess Arousal/Alertness: Awake/alert Orientation Level: Oriented to person;Disoriented to time;Disoriented to situation;Disoriented to place (assessed   with questions and pt nodding head) Attention: Focused;Sustained Focused Attention: Appears intact Sustained Attention: Appears intact Memory:  (UTA) Awareness: Impaired Awareness Impairment: Emergent impairment Problem Solving: Impaired Problem Solving Impairment: Functional basic Cognition Arousal/Alertness: Awake/alert Behavior During Therapy: WFL for tasks assessed/performed Overall Cognitive Status: Difficult to assess Area of Impairment: Problem solving;Awareness Following Commands: Follows one step commands with increased time Problem Solving: Slow processing General Comments: pt responds correctly to questions and smiling during this session. pt uses yes no response correctly > 75 % of session Difficult to assess due to: Impaired  communication  Physical Exam: Blood pressure 117/62, pulse 68, temperature 97.7 F (36.5 C), temperature source Oral, resp. rate 22, height 5' 8" (1.727 m), weight 74.844 kg (165 lb), SpO2 97.00%. Physical Exam  Vitals reviewed.  HENT: thrush over tongue Head: Normocephalic.  Eyes:  Pupils reactive to light , sclera anicteric Neck: Neck supple. No thyromegaly present.no jvd or LAD  Cardiovascular:  Cardiac rate control, no murmurs rubs or gallops,   Pulmonary/Chest: Effort normal and breath sounds normal. No respiratory distress,  Abdominal: Bowel sounds are normal. He exhibits mild distension. He has mild tenderness Neurological: He is alert.  Patient makes good eye contact with examiner. More alert today. Right central 7, right tongue deviation but improved control. Patient is aphasic. Does occasionally grunt, wife reports one word answers. He does not y/n correctly 50-75%. He did follow simple one-step commands with delay but fairly consistent. Has somewhat of a left gaze preference. Dense RUE 0/5 with decreased but present sense of pain. RLE is 1 to 1+ with HE and KE--0/5 distally. Senses pain more in the right leg.  Skin: Skin is warm and dry   Results for orders placed during the hospital encounter of 03/08/13 (from the past 48 hour(s))  CBC     Status: Abnormal   Collection Time    03/12/13  5:40 AM      Result Value Range   WBC 11.1 (*) 4.0 - 10.5 K/uL   RBC 4.05 (*) 4.22 - 5.81 MIL/uL   Hemoglobin 13.2  13.0 - 17.0 g/dL   HCT 37.9 (*) 39.0 - 52.0 %   MCV 93.6  78.0 - 100.0 fL   MCH 32.6  26.0 - 34.0 pg   MCHC 34.8  30.0 - 36.0 g/dL   RDW 13.1  11.5 - 15.5 %   Platelets 185  150 - 400 K/uL  CBC     Status: Abnormal   Collection Time    03/13/13  5:13 AM      Result Value Range   WBC 14.2 (*) 4.0 - 10.5 K/uL   RBC 4.06 (*) 4.22 - 5.81 MIL/uL   Hemoglobin 13.3  13.0 - 17.0 g/dL   HCT 38.0 (*) 39.0 - 52.0 %   MCV 93.6  78.0 - 100.0 fL   MCH 32.8  26.0 - 34.0 pg    MCHC 35.0  30.0 - 36.0 g/dL   RDW 13.2  11.5 - 15.5 %   Platelets 183  150 - 400 K/uL   Dg Chest Port 1 View  03/11/2013   *RADIOLOGY REPORT*  Clinical Data: Rule out aspiration  PORTABLE CHEST - 1 VIEW  Comparison: Chest radiograph 03/09/2013  Findings: Left-sided pacemaker overlies normal cardiac silhouette. There is a left pleural effusion not changed on paired to prior. There is atelectasis at the left  lung base which also unchanged. Right lung is relatively clear.  No pneumothorax  IMPRESSION: 1.  No significant   change. 2.  Left lower lobe atelectasis and effusion.   Original Report Authenticated By: Stewart Edmunds, M.D.    Post Admission Physician Evaluation: 1. Functional deficits secondary  to external capsule infarct, likely embolic. 2. Patient is admitted to receive collaborative, interdisciplinary care between the physiatrist, rehab nursing staff, and therapy team. 3. Patient's level of medical complexity and substantial therapy needs in context of that medical necessity cannot be provided at a lesser intensity of care such as a SNF. 4. Patient has experienced substantial functional loss from his/her baseline which was documented above under the "Functional History" and "Functional Status" headings.  Judging by the patient's diagnosis, physical exam, and functional history, the patient has potential for functional progress which will result in measurable gains while on inpatient rehab.  These gains will be of substantial and practical use upon discharge  in facilitating mobility and self-care at the household level. 5. Physiatrist will provide 24 hour management of medical needs as well as oversight of the therapy plan/treatment and provide guidance as appropriate regarding the interaction of the two. 6. 24 hour rehab nursing will assist with bladder management, bowel management, safety, skin/wound care, disease management, medication administration, pain management and patient education   and help integrate therapy concepts, techniques,education, etc. 7. PT will assess and treat for/with: Lower extremity strength, range of motion, stamina, balance, functional mobility, safety, adaptive techniques and equipment, NMR, visual-spatial awareness, cognitive perceptual awareness.   Goals are: mod assist. 8. OT will assess and treat for/with: ADL's, functional mobility, safety, upper extremity strength, adaptive techniques and equipment, NMR, visual-spatial awareness, cognitive perceptual awareness.   Goals are: moderate assist.. 9. SLP will assess and treat for/with: language, communication, speech, swallow.  Goals are: mod to max assist. 10. Case Management and Social Worker will assess and treat for psychological issues and discharge planning. 11. Team conference will be held weekly to assess progress toward goals and to determine barriers to discharge. 12. Patient will receive at least 3 hours of therapy per day at least 5 days per week. 13. ELOS: 3 weeks      Prognosis:  good   Medical Problem List and Plan: 1. Left basal ganglia, external capsule infarct felt to be embolic 2. DVT Prophylaxis/Anticoagulation: Eliquis 3. Pain Management: Tylenol as needed 4. Neuropsych: This patient is not capable of making decisions on his/her own behalf. 5. Dysphasia. Dysphagia 2 nectar thick liquids. Monitor for any signs of aspiration. Followup speech therapy  -encourage adequate PO intake 6. PAF/CAD/ CABG. Cardiac rate controlled. Continue Eliquis as directed. Followup cardiology services as needed 7. Hyperlipidemia. Crestor 8. Glaucoma. Continue eyedrops 9. Irritable bowel syndrome.Amitiza twice a day and MiraLAX daily.  -acute service to give bowel prep before transfer?           Zachary T. Swartz, MD, FAAPMR [place] [facility]  03/13/2013 

## 2013-03-13 NOTE — PMR Pre-admission (Signed)
PMR Admission Coordinator Pre-Admission Assessment  Patient: Frank Weeks is an 79 y.o., male MRN: [id number] DOB: July 24, 1929 Height: 5\' 8"  (172.7 cm) Weight: 74.844 kg (165 lb) (from previous admission)              Insurance Information HMO: yes    PPO:      PCP:      IPA:      80/20:      OTHER:  PRIMARY: [employer]      Policy#: [id number]      Subscriber: self  CM Name: Leatrice Jewels      Phone#: [phone]     Fax#: [phone] Pre-Cert#: TBD      Employer: retired Benefits:  Phone #: [phone]     Name: Marry Guan. Date: 10/15/12     Deduct: none      Out of Pocket Max: $3400      Life Max: none CIR: $170 day 1-7 / $0 day 8+      SNF: $0 day 1-10 / $50 day 11-100 Outpatient: no limit     Co-Pay: $25 (pre-cert for ST) Home Health: no copay DME: 80/20% Providers: in network  Emergency Contact Information Contact Information   Name Relation Home Work Mobile   Papesh,Dorothy Spouse [phone]  [phone]   Dontai, Pember   [phone]     Current Medical History  Patient Admitting Diagnosis: Left basal ganglia, external capsule infarct  History of Present Illness: 79 y.o. right-handed male with history of CAD/CABG, pacemaker, subarachnoid hemorrhage 50 years ago CVA. Admitted 03/08/2013 with fall reported by wife with right-sided weakness and slurred speech. Cranial CT showed developing low density left basal ganglia external capsule region consistent with acute infarct. Carotid Dopplers with no ICA stenosis. Echocardiogram with ejection fraction of 60% and normal systolic function. CT angiogram of the head showed a filling defect throughout the left M1 segment compatible with embolus causing acute infarct in left basal ganglia. Patient did not receive TPA. Patient is on a dysphagia 1 nectar thick liquid followed by speech therapy. 03/13/13 UPDATE:Neurology services consulted and placed on Eliquis for stroke prophylaxis in the setting of PAF .   Total: 19=NIH   Past Medical  History  Past Medical History  Diagnosis Date  . Coronary artery disease     a. 2007 CABGx3: LIMA->LAD, VG->RI, VG->RCA;  b. 2010 Aden MV: nonischemic.  . Bradycardia     a. s/p PPM.  . PVD (peripheral vascular disease)   . HTN (hypertension)   . Dyslipidemia   . Subarachnoid hemorrhage     a. aneurysmal 1964, s/p clipping.  . Cerebrovascular disease     a. s/p R CEA;  b. 02/2013 Carotid U/S: no signif ICA stenosis.  Marland Kitchen GERD (gastroesophageal reflux disease)   . Stroke     a. first @ age 52->memory deficits;  b. 02/2013 embolic stroke - left basal ganglia.  . Glaucoma   . Pleural effusion   . PAF (paroxysmal atrial fibrillation)     a. previously refused coumadin and felt to be poor coumadin candidate 2/2 falls/unsteady gait;  b. 02/2013 Echo: EF 55-60%, mild to mod AS.    Family History  family history includes Heart attack in his father and mother; Lung cancer in his mother; and Tremor in an unspecified family member.  Prior Rehab/Hospitalizations: No prior CIR  Current Medications  Current facility-administered medications:0.9 %  sodium chloride infusion, , Intravenous, Continuous, Lonia Blood, MD, Last Rate: 50 mL/hr at 03/13/13 0700;  antiseptic oral rinse (BIOTENE) solution 15 mL, 15 mL, Mouth Rinse, q12n4p, Zannie Cove, MD, 15 mL at 03/12/13 1700;  apixaban (ELIQUIS) tablet 5 mg, 5 mg, Oral, BID, Micki Riley, MD, 5 mg at 03/13/13 0954 chlorhexidine (PERIDEX) 0.12 % solution 15 mL, 15 mL, Mouth Rinse, BID, Zannie Cove, MD, 15 mL at 03/13/13 0753;  magnesium citrate solution 1 Bottle, 1 Bottle, Oral, Once, Jerald Kief, MD;  polyethylene glycol (MIRALAX / GLYCOLAX) packet 17 g, 17 g, Oral, Daily, Jerald Kief, MD, 17 g at 03/13/13 0953;  RESOURCE THICKENUP CLEAR, , Oral, PRN, Lonia Blood, MD rosuvastatin (CRESTOR) tablet 10 mg, 10 mg, Oral, q1800, Micki Riley, MD, 10 mg at 03/12/13 1745;  senna-docusate (Senokot-S) tablet 1 tablet, 1 tablet, Oral, QHS PRN,  Zannie Cove, MD;  Travoprost (BAK Free) (TRAVATAN) 0.004 % ophthalmic solution SOLN 1 drop, 1 drop, Both Eyes, QHS, Micki Riley, MD, 1 drop at 03/12/13 2111  Patients Current Diet: Dysphagia 2, nectar-thick liquids  Precautions / Restrictions Precautions Precautions: Fall Precaution Comments: expressive aphasia Restrictions Weight Bearing Restrictions: No   Prior Activity Level Community (5-7x/wk): Active daily Home Assistive Devices / Equipment Home Assistive Devices/Equipment: None Home Adaptive Equipment: Walker - standard  Prior Functional Level Prior Function Level of Independence: Independent Able to Take Stairs?: Yes Driving: Yes Vocation: Retired  Current Functional Level Cognition  Arousal/Alertness: Awake/alert Overall Cognitive Status: Difficult to assess Difficult to assess due to: Impaired communication Orientation Level: Oriented to person;Disoriented to time;Disoriented to situation;Disoriented to place (assessed with questions and pt nodding head) Following Commands: Follows one step commands with increased time General Comments: pt responds correctly to questions and smiling during this session. pt uses yes no response correctly > 75 % of session Attention: Focused;Sustained Focused Attention: Appears intact Sustained Attention: Appears intact Memory:  (UTA) Awareness: Impaired Awareness Impairment: Emergent impairment Problem Solving: Impaired Problem Solving Impairment: Functional basic    Extremity Assessment (includes Sensation/Coordination)  RUE ROM/Strength/Tone: Deficits RUE ROM/Strength/Tone Deficits: No movement including involuntary.  Tone WFL. Shoulder subluxation not palpable.  Hand is maintained open. RUE Sensation: Deficits RUE Sensation Deficits: Not able to formally test due to aphasia, appears to have impaired proprioceptive awareness. RUE Coordination: Deficits RUE Coordination Deficits: no functional use  RLE  ROM/Strength/Tone: Deficits RLE ROM/Strength/Tone Deficits: PROM WFL with increased extensor tone noted at ankle, minimal voluntary movement noted. RLE Sensation: Deficits RLE Sensation Deficits: decreased as compared to left    ADLs  Grooming: Wash/dry face;Set up Where Assessed - Grooming: Supported sitting Upper Body Bathing: Maximal assistance Where Assessed - Upper Body Bathing: Supported sitting Lower Body Bathing: +1 Total assistance Where Assessed - Lower Body Bathing: Supported sitting;Supported sit to stand Upper Body Dressing: Maximal assistance Where Assessed - Upper Body Dressing: Supported sitting Lower Body Dressing: +1 Total assistance Where Assessed - Lower Body Dressing: Supported sitting;Supported sit to Pharmacist, hospital: +2 Total assistance Toilet Transfer: Patient Percentage: 30% Statistician Method: Surveyor, minerals: Raised toilet seat with arms (or 3-in-1 over toilet) Toileting - Clothing Manipulation and Hygiene: +1 Total assistance Where Assessed - Glass blower/designer Manipulation and Hygiene: Standing Equipment Used: Gait belt Transfers/Ambulation Related to ADLs: session focused on bed mobiilty and EOB <>chair <>EOB. pt not wanting to remain in chair due to pain at belly and right side. Difficult to fully assess due to aphasia. Pt required total +2 (A) for transfers with Rt LE buckling. pt demonstrates hyperextension at Rt knee during session as  well. Pt initiates steps with Lt Le and facilitate weight shift. Pt implusive with transfers needing max cues ADL Comments: Pt supine on arrival. pt attempting to verbalize throughout session. pt said YeS and "I am ready to go walk to ?" OT was unable to determine last word in statement with multiple attempts. pt encouraged to use YES NO and thumbs up and down during session. Pt with LOB when not using LT UE support.     Mobility  Bed Mobility: Rolling Right;Right Sidelying to Sit;Supine to  Sit Rolling Right: 4: Min guard;With rail Rolling Left: 3: Mod assist (performed x2 for technique) Right Sidelying to Sit: 3: Mod assist;With rails;HOB flat Left Sidelying to Sit: 1: +2 Total assist Left Sidelying to Sit: Patient Percentage: 40% Supine to Sit: 3: Mod assist;With rails Supine to Sit: Patient Percentage: 40% Sitting - Scoot to Edge of Bed: 3: Mod assist Sit to Supine: 2: Max assist;HOB flat;With rail (Needed (A) with BIL LE)    Transfers  Transfers: Stand Pivot Transfers Sit to Stand: 1: +2 Total assist;With upper extremity assist;From bed Sit to Stand: Patient Percentage: 40% Stand to Sit: 1: +2 Total assist;With upper extremity assist;To bed Stand to Sit: Patient Percentage: 40% Stand Pivot Transfers: 1: +2 Total assist Stand Pivot Transfers: Patient Percentage: 50% Squat Pivot Transfers: 1: +2 Total assist Squat Pivot Transfers: Patient Percentage: 50%    Ambulation / Gait / Stairs / Wheelchair Mobility  Ambulation/Gait Ambulation/Gait Assistance: Not tested (comment)    Posture / Balance Static Sitting Balance Static Sitting - Balance Support: Feet supported;Left upper extremity supported;No upper extremity supported Static Sitting - Level of Assistance: 4: Min assist Static Sitting - Comment/# of Minutes: v/c to line up with IV pole to find midline. Requiring intermittent min/mod assist to prevent fall due to LOB. EOB 5 minutes. Dynamic Sitting Balance Dynamic Sitting - Balance Support: Feet supported;No upper extremity supported Dynamic Sitting - Level of Assistance: 5: Stand by assistance;4: Min assist;3: Mod assist Dynamic Sitting Balance - Compensations: Sitting in chair, pt leaned to the left by therapist and then asked to correct back to midline. Pt able to correct with 80% accuracy with only v/c. Pt also sitting EOB and instructed to look around the room for objects while maintaining proper midline allignment. Pt requiring min/mod assist intermittently due  to LOB. Stand by assit 50% of this time. EOB 8 minutes  Static Standing Balance Static Standing - Balance Support: During functional activity Static Standing - Level of Assistance: 1: +2 Total assist Static Standing - Comment/# of Minutes: 5 minutes. Pt requiring faciilitation for right hip drop, hip extension, and upright trunk. Weight shift to the left requiring max facilitation.  Dynamic Standing Balance Dynamic Standing - Balance Support: Left upper extremity supported Dynamic Standing - Level of Assistance: 1: +2 Total assist Dynamic Standing - Balance Activities: Lateral lean/weight shifting Dynamic Standing - Comments: Pt requiring faciilitation for right hip drop, hip extension, and upright trunk. Weight shift to the left requiring max facilitation. Stood for 3 minutes total with seated rest breaks twice.    Special needs/care consideration Continuous Drip IV: yes Skin: WDL                              Bowel mgmt: ? LBM Bladder mgmt: Foley cath    Previous Home Environment Lives With: Spouse Available Help at Discharge: Family;Available 24 hours/day (wife can only provide supervision) Type of Home: House  Home Layout: Two level;Able to live on main level with bedroom/bathroom Home Access: Stairs to enter Entrance Stairs-Rails: None Entrance Stairs-Number of Steps: 1 Bathroom Shower/Tub: Health visitor:  (comfort height) Home Care Services: No  Discharge Living Setting Plans for Discharge Living Setting: Patient's home;Other (Comment) (Most likely will need SNF after CIR) Type of Home at Discharge: House Discharge Home Layout: Two level;Able to live on main level with bedroom/bathroom Discharge Home Access: Stairs to enter Entrance Stairs-Number of Steps: 1 Discharge Bathroom Shower/Tub: Walk-in shower Do you have any problems obtaining your medications?: No  Social/Family/Support Systems Patient Roles: Spouse;Parent Contact Information: Home:  [phone] Anticipated Caregiver: Wife: Breckan Cafiero Anticipated Caregiver's Contact Information: [phone] Ability/Limitations of Caregiver: Supervision - min assist Caregiver Availability: 24/7 Discharge Plan Discussed with Primary Caregiver: Yes (Plan is for SNF after CIR, if needed.) Is Caregiver In Agreement with Plan?: Yes Does Caregiver/Family have Issues with Lodging/Transportation while Pt is in Rehab?: No   Goals/Additional Needs Patient/Family Goal for Rehab: Moderate assist overall Expected length of stay: 3 weeks Cultural Considerations: none Dietary Needs: Dysphagia 2, nectar thick liquids Equipment Needs: TBD Pt/Family Agrees to Admission and willing to participate: Yes Program Orientation Provided & Reviewed with Pt/Caregiver Including Roles  & Responsibilities: Yes  Decrease burden of Care through IP rehab admission: Specialzed equipment needs, Diet advancement, Decrease number of caregivers, Bowel and bladder program and Patient/family education  Possible need for SNF placement upon discharge: Yes. Family is in agreement that patient will most likely require further rehabilitation at a SNF after CIR. Discussed with [employer] CM and have been assured (verbally) that this will be approved if patient meets criteria for SNF.  Patient Condition: This patient's medical and functional status has changed since the consult dated: 03/11/13 in which the Rehabilitation Physician determined and documented that the patient's condition is appropriate for intensive rehabilitative care in an inpatient rehabilitation facility. See "History of Present Illness" (above) for medical update. Functional changes are: Increased activity tolerance, min - mod assist with bed mobility, at 50% with transfers, continues with dysphagia 2  diet with nectar thick liquids . Patient's medical and functional status update has been discussed with the Rehabilitation physician and patient remains appropriate for  inpatient rehabilitation. Will admit to inpatient rehab today.  Preadmission Screen Completed By:  Meryl Dare, 03/13/2013 11:21 AM ______________________________________________________________________   Discussed status with Dr. Riley Kill on 03/13/13 at 1430 and received telephone approval for admission today.  Admission Coordinator:  Meryl Dare, time 1434/Date 03/13/13

## 2013-03-13 NOTE — Progress Notes (Signed)
Patient came from [facility] admitted to inpatient rehab at 1700.  Patient is alert, oriented to self, with global aphasia.  Admission packet provided to family with explanation. Side rails up x3,call bell within reach, patient is resting.

## 2013-03-13 NOTE — Discharge Summary (Signed)
Physician Discharge Summary  Frank Weeks WUJ:[id number] DOB: [birth year] DOA: 03/08/2013  PCP: Ezequiel Kayser, MD  Admit date: 03/08/2013 Discharge date: 03/13/2013  Time spent: 20 minutes  Recommendations for Outpatient Follow-up:  Followup with primary care provider in 1-2 weeks after hospital discharge Follow up with Neurology Dr. Pearlean Brownie as scheduled  Discharge Diagnoses:  Active Problems:   HYPERTENSION, UNSPECIFIED   CAD, ARTERY BYPASS GRAFT   Atrioventricular block, complete   Atrial fibrillation   Subarachnoid hemorrhage   Hemiparesis   Atrial flutter   Discharge Condition: Stable  Diet recommendation: Dysphasia level II with nectar thick liquid  Filed Weights   03/09/13 2100  Weight: 74.844 kg (165 lb)    History of present illness:  Frank Weeks is a 79 y.o. 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 EMS arrived and noted that he was had dense R hemiparesis, dysarthria.  He was brought to [facility] as code stroke, was not given TPA due to H/o SAH.  CT head negative for acute changes   Hospital Course:  Patient was noted to the inpatient service. Patient was initially on aspirin at home for stroke prevention. Given the history of atrial fibrillation, patient was recommended to transition to Eliquis. Carotid Dopplers were unremarkable. The patient was noted to have a flutter on an EKG. Patient currently followed by left [facility] who were subsequently consulted with recommendations to continue anticoagulation as tolerated. Patient otherwise remains rate controlled during this hospital course.  Patient also completed his treatment of permethrin cream for his recently diagnosed scabies. Last dose was on 03/11/2013   Patient is also noted be constipated during his hospital course. Patient did receive multiple bowel regimens with little relief. The patient was  ultimately started on Amitiza with continued cathartics as tolerated.  Presently, the patient remains medically stable and is appropriate for transfer to [facility].  Procedures: 5/25 - carotid dopplers - no significant ICA stenosis - right CEA appears patent - vertebral artery flow is antegrade  5/26 - TTE - unremarkable   Consultations:  Neurology  [facility]  Discharge Exam: Filed Vitals:   03/13/13 0208 03/13/13 0533 03/13/13 0930 03/13/13 1317  BP: 109/61 117/72 117/62 96/52  Pulse: 69 69 68 70  Temp: 97.5 F (36.4 C) 97.8 F (36.6 C) 97.7 F (36.5 C) 98 F (36.7 C)  TempSrc: Oral Oral Oral Oral  Resp: 20 20 22 20   Height:      Weight:      SpO2: 95% 98% 97% 97%    General: Patient is awake in no apparent distress Cardiovascular: Regular, S1-S2 Respiratory: Normal respiratory effort, no crackles no wheezing  Discharge Instructions       Future Appointments Provider Department Dept Phone   03/14/2013 8:00 AM Mable Fill, CCC-SLP [facility]  [address]  [place] [phone]   03/14/2013 9:15 AM Karrie Meres, PT [facility]  [address]  [place] [phone]   03/14/2013 11:00 AM Agustin Cree, OT [facility]  [address]  [place] [phone]   03/14/2013 3:00 PM Agustin Cree, OT [facility]  [address]  [place] [phone]   03/15/2013 2:00 PM Philip Aspen, PT [facility]  [address] [place] [phone]   03/19/2013 9:30 AM Duke Salvia, MD [facility] Main Office [facility])  [phone]   05/06/2013 10:30 AM Rollene Rotunda, MD [facility] [place] [phone]       Medication List    STOP taking these medications       aspirin EC 81 MG tablet     permethrin 5 % cream  Commonly known as:  ELIMITE      TAKE these medications       antiseptic oral rinse Liqd  15 mLs by Mouth Rinse route 2 times  daily at 12 noon and 4 pm.     apixaban 5 MG Tabs tablet  Commonly known as:  ELIQUIS  Take 1 tablet (5 mg total) by mouth 2 (two) times daily.     chlorhexidine 0.12 % solution  Commonly known as:  PERIDEX  Use as directed 15 mLs in the mouth or throat 2 (two) times daily.     co-enzyme Q-10 30 MG capsule  Take 30 mg by mouth 2 (two) times daily.     fish oil-omega-3 fatty acids 1000 MG capsule  Take 2 g by mouth daily.     lubiprostone 24 MCG capsule  Commonly known as:  AMITIZA  Take 1 capsule (24 mcg total) by mouth 2 (two) times daily with a meal.     nitroGLYCERIN 0.4 MG SL tablet  Commonly known as:  NITROSTAT  Place 0.4 mg under the tongue every 5 (five) minutes as needed for chest pain.     polyethylene glycol packet  Commonly known as:  MIRALAX / GLYCOLAX  Take 17 g by mouth daily.     ramipril 10 MG capsule  Commonly known as:  ALTACE  Take 10 mg by mouth daily.     rosuvastatin 10 MG tablet  Commonly known as:  CRESTOR  Take 10 mg by mouth. Take 1 tablet by mouth three times a week.     senna-docusate 8.6-50 MG per tablet  Commonly known as:  Senokot-S  Take 1 tablet by mouth at bedtime as needed.     travoprost (benzalkonium) 0.004 % ophthalmic solution  Commonly known as:  TRAVATAN  Place 1 drop into both eyes at bedtime.       Allergies  Allergen Reactions  . Atorvastatin Other (See Comments)    Joints sore.    Follow-up Information   Follow up with Gates Rigg, MD. Schedule an appointment as soon as possible for a visit in 2 months. (stroke clinic)    Contact information:   [address] [place] [address] [phone]       Follow up with Rodrigo Ran A, MD In 2 weeks.   Contact information:   [address] [place] [address] [phone]        The results of significant diagnostics from this hospitalization (including imaging, microbiology, ancillary and laboratory) are listed below for reference.     Significant Diagnostic Studies: Ct Angio Head W/cm &/or Wo Cm  03/10/2013   *RADIOLOGY REPORT*  Clinical Data:  Stroke.  Right-sided weakness and expressive aphasia.  Pacemaker  CT ANGIOGRAPHY HEAD  Technique:  Multidetector CT imaging of the head was performed using the standard protocol during bolus administration of intravenous contrast.  Multiplanar CT image reconstructions including MIPs were obtained to evaluate the vascular anatomy.  Contrast: 80mL OMNIPAQUE IOHEXOL 350 MG/ML SOLN  Comparison:  CT head 03/09/2013  Findings:  Progression of ill-defined hypodensity in the left basal ganglia consistent with acute infarct.  This involves essentially all of the putamen.  The left globus pallidus  may also be involved. No other acute infarct or hemorrhage.  There is generalized atrophy and chronic microvascular ischemia in the white matter.  Chronic infarct right cerebellum is unchanged.  Both vertebral arteries are patent to the basilar.  There is mild atherosclerotic disease in the distal basilar bilaterally.  Right PICA is widely patent.  Left PICA not visualized.  PICA and superior cerebellar arteries are patent bilaterally.  Posterior cerebral arteries are patent bilaterally.  Right cavernous carotid is patent with atherosclerotic disease and mild stenosis.  Right anterior and  middle cerebral arteries are patent bilaterally.  Left cavernous carotid shows mild atherosclerotic disease and mild stenosis.  There is filling defect in the left M1 segment compatible with thrombus or embolus.  The patient does have atrial fibrillation suggesting   embolus.  This is nonobstructing and there is flow in left middle cerebral artery branches which appear normal.  Left anterior cerebral artery is widely patent.  Negative for cerebral aneurysm.   Review of the MIP images confirms the above findings.  IMPRESSION: Filling defect throughout the left M1 segment compatible with an embolus causing acute infarct in the left  basal ganglia.  Mild atherosclerotic disease in the distal vertebral arteries and the cavernous carotid artery bilaterally.  Negative for aneurysm.   Original Report Authenticated By: Janeece Riggers, M.D.   Dg Chest 2 View  03/09/2013   *RADIOLOGY REPORT*  Clinical Data: CVA.  CHEST - 2 VIEW  Comparison:  03/08/2013.  Findings: The heart is enlarged but stable.  Stable pacer wires. Persistent vascular congestion and interstitial edema along with a left pleural effusion and left lower lobe atelectasis.  IMPRESSION: Persistent cardiac enlargement, CHF, left pleural effusion and left lower lobe atelectasis.   Original Report Authenticated By: Rudie Meyer, M.D.   Ct Head Wo Contrast  03/09/2013   *RADIOLOGY REPORT*  Clinical Data: Right hemipareses.  Right facial weakness. Dysarthria.  CT HEAD WITHOUT CONTRAST  Technique:  Contiguous axial images were obtained from the base of the skull through the vertex without contrast.  Comparison: 03/08/2013  Findings: The patient has developed low density in the left basal ganglia and external capsule region consistent with acute infarction.  No mass effect or hemorrhage.  There are old cerebellar infarctions inferiorly on the right. There is an old right frontal parietal deep white matter infarction.  Chronic small vessel changes effect the white matter diffusely.  IMPRESSION: Developing low density in the left basal ganglia/external capsule region consistent with acute infarction.  No mass effect or hemorrhage.   Original Report Authenticated By: Paulina Fusi, M.D.   Ct Head Wo Contrast  03/08/2013   *RADIOLOGY REPORT*  Clinical Data: Acute onset right sided facial droop, altered speech  CT HEAD WITHOUT CONTRAST  Technique:  Contiguous axial images were obtained from the base of the skull through the vertex without contrast.  Comparison: None.  Findings:  There is asymmetric periventricular hypodensities involving the subcortical white matter about the right lateral  ventricle extending to the subcortical surface of the right parietal lobe (images 24 and 25), suggestive of infarct.  A lacunar infarct is seen within the left insular cortex (image 18).  Discrete areas of encephalomalacia compatible with prior infarcts are seen involving the right cerebellum (images six and seven).  Mild diffuse atrophy with centralized volume loss and commiserate ex vacuo dilatation of the ventricular system.  There is mild increased attenuation of the intracranial blood pool with the bilateral MCAs appearing grossly symmetrically dense given obliquity.  Given extensive background parenchymal abnormalities, there is no CT evidence of acute large territory infarct. No definite intraparenchymal or extra-axial mass or hemorrhage.  Normal size and configuration of the ventricles and basilar cisterns.  No midline shift.  Limited visualization of the paranasal sinuses demonstrates minimal mucosal thickening within the left maxillary sinus.  The remaining paranasal sinuses and mastoid air cells appear normally aerated. Regional soft tissues appear normal.  No displaced calvarial fracture.  IMPRESSION:  1.  No definite acute large territory infarct.  No intraparenchymal or extra-axial hemorrhage. 2.  Prior infarcts involving the right subcortical parietal lobe and right cerebellum.  3.  Atrophy and advanced microvascular ischemic disease.  Above findings discussed with Dr. Leroy Kennedy at 09:39.   Original Report Authenticated By: Tacey Ruiz, MD   Dg Chest Port 1 View  03/11/2013   *RADIOLOGY REPORT*  Clinical Data: Rule out aspiration  PORTABLE CHEST - 1 VIEW  Comparison: Chest radiograph 03/09/2013  Findings: Left-sided pacemaker overlies normal cardiac silhouette. There is a left pleural effusion not changed on paired to prior. There is atelectasis at the left  lung base which also unchanged. Right lung is relatively clear.  No pneumothorax  IMPRESSION: 1.  No significant change. 2.  Left lower lobe  atelectasis and effusion.   Original Report Authenticated By: Genevive Bi, M.D.   Dg Chest Portable 1 View  03/08/2013   *RADIOLOGY REPORT*  Clinical Data: Stroke  PORTABLE CHEST - 1 VIEW  Comparison: 03/24/2009  Findings: Cardiomegaly with suspected mild interstitial edema. Moderate left pleural effusion.  Associated left lower lobe opacity, possibly atelectasis.  Postsurgical changes related to prior CABG.  Left subclavian pacemaker.  IMPRESSION: Cardiomegaly with suspected mild interstitial edema.  Moderate left pleural effusion.  Associated left lower lobe opacity, possibly atelectasis.   Original Report Authenticated By: Charline Bills, M.D.   Dg Swallowing Func-speech Pathology  03/09/2013   Riley Nearing Deblois, CCC-SLP     03/09/2013 11:36 AM Objective Swallowing Evaluation: Modified Barium Swallowing Study   Patient Details  Name: GRANGER CHUI MRN: [id number] Date of Birth: 1928/12/16  Today's Date: 03/09/2013 Time: 4098-1191 SLP Time Calculation (min): 29 min  Past Medical History:  Past Medical History  Diagnosis Date  . Coronary artery disease     Prior CABG  . Chest pain, non-cardiac     Negative Myoview  . Pacemaker     Second degree heart block and bradycardia  . PVD (peripheral vascular disease)   . HTN (hypertension)   . Dyslipidemia   . Subarachnoid hemorrhage   . Cerebrovascular disease   . GERD (gastroesophageal reflux disease)   . Second degree AV block, Mobitz type II   . Heart block AV first degree   . SOB (shortness of breath)   . Stroke   . Glaucoma(365)   . Pleural effusion    Past Surgical History:  Past Surgical History  Procedure Laterality Date  . Coronary artery bypass graft  03/18/2006  . Right carotid endarectomy  09/2004  . Left carotid to vertebral bypass    . Dual-chamber pacemaker implantation    . Thoracentesis and bronchoscopy     HPI:  79 y.o. male with acute onset right hemiparesis, right face  weakness, and dysarthria. Demosntrated overt evidence of  aspiration at  bedside, MBS to objectively evaluate function.      Assessment / Plan / Recommendation Clinical Impression  Dysphagia Diagnosis: Moderate oral phase dysphagia;Moderate  pharyngeal phase dysphagia Clinical impression: Pt presents with a  sensorimotor oral  dysphagia with right oral weakness and numbness and lingual  weakness leading to moderate oral residuals and anterior  spillage. With max assist to place bites in on the left and sweep  right buccal cavity, the pt can manage soft solids. Will attempt  in therapy.   Pharyngeal phase characterized primarily by sensory deficits  with decreased sensation for swallow trigger. Thin liquids are  aspirated before the swallow with sensation of only significant  aspiration. Cough is not sufficient to expel aspirate. Recommend  Dys 1 (puree) diet and nectar thick liquids via straw with full  supervision. SLP will follow for tolerance and therapeutic  intervention.     Treatment Recommendation  Therapy as outlined in treatment plan below    Diet Recommendation Dysphagia 1 (Puree);Nectar-thick liquid   Liquid Administration via: Straw Medication Administration: Crushed with puree Supervision: Full supervision/cueing for compensatory  strategies;Patient able to self feed Compensations: Slow rate;Small sips/bites;Check for  pocketing;Check for anterior loss Postural Changes and/or Swallow Maneuvers: Seated upright 90  degrees;Out of bed for meals    Other  Recommendations Recommended Consults: MBS Oral Care Recommendations: Oral care BID;Oral care before and  after PO (clear out mouth after PO) Other Recommendations: Order thickener from pharmacy   Follow Up Recommendations  Inpatient Rehab    Frequency and Duration min 2x/week  2 weeks   Pertinent Vitals/Pain NA    SLP Swallow Goals Patient will utilize recommended strategies during swallow to  increase swallowing safety with: Moderate cueing Swallow Study Goal #2 - Progress: Progressing toward goal   General HPI: 79 y.o. male  with acute onset right hemiparesis,  right face weakness, and dysarthria. Demosntrated overt evidence  of aspiration at bedside, MBS to objectively evaluate function.  Type of Study: Modified Barium Swallowing Study Reason for Referral: Objectively evaluate swallowing function Diet Prior to this Study: NPO Temperature Spikes Noted: No Respiratory Status: Room air History of Recent Intubation: No Behavior/Cognition: Alert;Cooperative;Pleasant mood Oral Cavity - Dentition: Adequate natural dentition Oral Motor / Sensory Function: Impaired - see Bedside swallow  eval Self-Feeding Abilities: Able to feed self;Needs assist Patient Positioning: Upright in chair Baseline Vocal Quality: Clear;Low vocal intensity Volitional Cough: Weak Volitional Swallow: Able to elicit Anatomy: Within functional limits Pharyngeal Secretions: Not observed secondary MBS    Reason for Referral Objectively evaluate swallowing function   Oral Phase Oral Preparation/Oral Phase Oral Phase: Impaired Oral - Nectar Oral - Nectar Cup: Right anterior bolus loss;Weak lingual  manipulation;Right pocketing in lateral sulci;Delayed oral  transit Oral - Nectar Straw: Delayed oral transit;Weak lingual  manipulation Oral - Thin Oral - Thin Cup: Right anterior bolus loss;Weak lingual  manipulation;Right pocketing in lateral sulci;Delayed oral  transit Oral - Solids Oral - Puree: Right anterior bolus loss;Weak lingual  manipulation;Right pocketing in lateral sulci;Delayed oral  transit Oral - Mechanical Soft: Right anterior bolus loss;Weak lingual  manipulation;Right pocketing in lateral sulci;Delayed oral  transit Oral - Pill: Delayed oral transit;Pocketing in anterior  sulcus;Reduced posterior propulsion   Pharyngeal Phase Pharyngeal Phase Pharyngeal Phase: Impaired Pharyngeal - Nectar Pharyngeal - Nectar Cup: Delayed swallow initiation;Premature  spillage to pyriform sinuses Pharyngeal - Nectar Straw: Delayed swallow initiation;Premature  spillage to  pyriform sinuses Penetration/Aspiration details (nectar straw): Material does not  enter airway Pharyngeal - Thin Pharyngeal - Thin Cup: Delayed swallow initiation;Premature  spillage to pyriform sinuses;Penetration/Aspiration before  swallow;Significant aspiration (Amount) Penetration/Aspiration details (thin cup): Material enters  airway, passes BELOW cords without attempt by patient to eject  out (  silent aspiration);Material enters airway, passes BELOW  cords and not ejected out despite cough attempt by patient (chin  tuck increases aspiration. ) Pharyngeal - Solids Pharyngeal - Puree: Delayed swallow initiation;Premature spillage  to valleculae Pharyngeal - Mechanical Soft: Premature spillage to  valleculae;Delayed swallow initiation Pharyngeal - Pill: Not tested  Cervical Esophageal Phase    GO   Harlon Ditty, MA CCC-SLP [phone]            Claudine Mouton 03/09/2013, 11:33 AM     Microbiology: Recent Results (from the past 240 hour(s))  MRSA PCR SCREENING     Status: None   Collection Time    03/08/13  3:13 PM      Result Value Range Status   MRSA by PCR NEGATIVE  NEGATIVE Final   Comment:            The GeneXpert MRSA Assay (FDA     approved for NASAL specimens     only), is one component of a     comprehensive MRSA colonization     surveillance program. It is not     intended to diagnose MRSA     infection nor to guide or     monitor treatment for     MRSA infections.     Labs: Basic Metabolic Panel:  Recent Labs Lab 03/08/13 0925 03/08/13 0931 03/10/13 0440  NA 137 142 135  K 4.5 4.5 3.9  CL 102 106 103  CO2 25  --  22  GLUCOSE 100* 98 107*  BUN 15 17 12   CREATININE 0.96 0.90 0.75  CALCIUM 9.5  --  8.9   Liver Function Tests:  Recent Labs Lab 03/08/13 0925  AST 21  ALT 18  ALKPHOS 93  BILITOT 0.4  PROT 8.5*  ALBUMIN 3.6   No results found for this basename: LIPASE, AMYLASE,  in the last 168 hours No results found for this basename: AMMONIA,  in  the last 168 hours CBC:  Recent Labs Lab 03/08/13 0925 03/08/13 0931 03/10/13 0440 03/11/13 0600 03/12/13 0540 03/13/13 0513  WBC 9.9  --  9.7 9.7 11.1* 14.2*  NEUTROABS 7.2  --   --   --   --   --   HGB 14.1 15.0 13.1 13.0 13.2 13.3  HCT 41.6 44.0 38.6* 38.6* 37.9* 38.0*  MCV 96.7  --  95.3 94.8 93.6 93.6  PLT 185  --  176 163 185 183   Cardiac Enzymes:  Recent Labs Lab 03/08/13 0925  TROPONINI <0.30   BNP: BNP (last 3 results) No results found for this basename: PROBNP,  in the last 8760 hours CBG:  Recent Labs Lab 03/08/13 0950 03/09/13 0733 03/09/13 1146 03/09/13 1534  GLUCAP 93 110* 125* 138*       Signed:  CHIU, STEPHEN K  [facility] 03/13/2013, 3:17 PM

## 2013-03-13 NOTE — Progress Notes (Signed)
Received authorization # from [facility]: [id number].

## 2013-03-13 NOTE — H&P (View-Only) (Signed)
Physical Medicine and Rehabilitation Admission H&P    Chief Complaint  Patient presents with  . Code Stroke  : HPI: Frank Weeks is a 79 y.o. right-handed male with history of CAD/CABG, PAF pacemaker, subarachnoid hemorrhage 50 years ago CVA. Admitted 03/08/2013 with fall reported by wife with right-sided weakness and slurred speech. Cranial CT showed developing low density left basal ganglia external capsule region consistent with acute infarct. Carotid Dopplers with no ICA stenosis. Echocardiogram with ejection fraction of 60% and normal systolic function. CT angiogram of the head showed a filling defect throughout the left M1 segment compatible with embolus causing acute infarct in left basal ganglia. Patient did not receive TPA. Neurology services consulted and placed on Eliquis for stroke prophylaxis in the setting of PAF . Patient is on a dysphagia 2 nectar thick liquid followed by speech therapy. Physical and occupational therapy evaluations completed 03/09/2013 recommendations made for physical medicine rehabilitation consult. Patient was felt to be a candidate for inpatient rehabilitation services and was admitted for comprehensive rehabilitation program  Review of Systems  Cardiovascular: Positive for palpitations and leg swelling.  Gastrointestinal:       GERD  Musculoskeletal: Positive for myalgias.  Neurological: Positive for weakness.  All other systems reviewed and are negative.     Past Medical History  Diagnosis Date  . Coronary artery disease     a. 2007 CABGx3: LIMA->LAD, VG->RI, VG->RCA;  b. 2010 Aden MV: nonischemic.  . Bradycardia     a. s/p PPM.  . PVD (peripheral vascular disease)   . HTN (hypertension)   . Dyslipidemia   . Subarachnoid hemorrhage     a. aneurysmal 1964, s/p clipping.  . Cerebrovascular disease     a. s/p R CEA;  b. 02/2013 Carotid U/S: no signif ICA stenosis.  Marland Kitchen GERD (gastroesophageal reflux disease)   . Stroke     a. first @ age 56->memory  deficits;  b. 02/2013 embolic stroke - left basal ganglia.  . Glaucoma   . Pleural effusion   . PAF (paroxysmal atrial fibrillation)     a. previously refused coumadin and felt to be poor coumadin candidate 2/2 falls/unsteady gait;  b. 02/2013 Echo: EF 55-60%, mild to mod AS.   Past Surgical History  Procedure Laterality Date  . Coronary artery bypass graft  03/18/2006  . Right carotid endarectomy  09/2004  . Left carotid to vertebral bypass    . Dual-chamber pacemaker implantation    . Thoracentesis and bronchoscopy     Family History  Problem Relation Age of Onset  . Heart attack Mother   . Lung cancer Mother   . Heart attack Father   . Tremor     Social History:  reports that he quit smoking about 62 years ago. He does not have any smokeless tobacco history on file. He reports that he does not drink alcohol. His drug history is not on file. Allergies:  Allergies  Allergen Reactions  . Atorvastatin Other (See Comments)    Joints sore.    Medications Prior to Admission  Medication Sig Dispense Refill  . aspirin EC 81 MG tablet Take 1 tablet (81 mg total) by mouth daily.      Marland Kitchen co-enzyme Q-10 30 MG capsule Take 30 mg by mouth 2 (two) times daily.      . fish oil-omega-3 fatty acids 1000 MG capsule Take 2 g by mouth daily.        . nitroGLYCERIN (NITROSTAT) 0.4 MG SL tablet Place 0.4  mg under the tongue every 5 (five) minutes as needed for chest pain.      Marland Kitchen permethrin (ELIMITE) 5 % cream Apply 1 application topically once. Took first treatment and needs second treatment on Wednesday.      . ramipril (ALTACE) 10 MG capsule Take 10 mg by mouth daily.        . rosuvastatin (CRESTOR) 10 MG tablet Take 10 mg by mouth. Take 1 tablet by mouth three times a week.       . travoprost, benzalkonium, (TRAVATAN) 0.004 % ophthalmic solution Place 1 drop into both eyes at bedtime.          Home: Home Living Lives With: Spouse Available Help at Discharge: Family;Available 24 hours/day (wife  can only provide supervision) Type of Home: House Home Access: Stairs to enter [employer] of Steps: 1 Entrance Stairs-Rails: None Home Layout: Two level;Able to live on main level with bedroom/bathroom Bathroom Shower/Tub: Health visitor:  (comfort height) Home Adaptive Equipment: Walker - standard   Functional History:    Functional Status:  Mobility: Bed Mobility Bed Mobility: Rolling Right;Right Sidelying to Sit;Supine to Sit Rolling Right: 4: Min guard;With rail Rolling Left: 3: Mod assist (performed x2 for technique) Right Sidelying to Sit: 3: Mod assist;With rails;HOB flat Left Sidelying to Sit: 1: +2 Total assist Left Sidelying to Sit: Patient Percentage: 40% Supine to Sit: 3: Mod assist;With rails Supine to Sit: Patient Percentage: 40% Sitting - Scoot to Edge of Bed: 3: Mod assist Sit to Supine: 2: Max assist;HOB flat;With rail (Needed (A) with BIL LE) Transfers Transfers: Stand Pivot Transfers Sit to Stand: 1: +2 Total assist;With upper extremity assist;From bed Sit to Stand: Patient Percentage: 40% Stand to Sit: 1: +2 Total assist;With upper extremity assist;To bed Stand to Sit: Patient Percentage: 40% Stand Pivot Transfers: 1: +2 Total assist Stand Pivot Transfers: Patient Percentage: 50% Squat Pivot Transfers: 1: +2 Total assist Squat Pivot Transfers: Patient Percentage: 50% Ambulation/Gait Ambulation/Gait Assistance: Not tested (comment)    ADL: ADL Grooming: Wash/dry face;Set up Where Assessed - Grooming: Supported sitting Upper Body Bathing: Maximal assistance Where Assessed - Upper Body Bathing: Supported sitting Lower Body Bathing: +1 Total assistance Where Assessed - Lower Body Bathing: Supported sitting;Supported sit to stand Upper Body Dressing: Maximal assistance Where Assessed - Upper Body Dressing: Supported sitting Lower Body Dressing: +1 Total assistance Where Assessed - Lower Body Dressing: Supported  sitting;Supported sit to Pharmacist, hospital: +2 Total assistance Toilet Transfer Method: Surveyor, minerals: Raised toilet seat with arms (or 3-in-1 over toilet) Equipment Used: Gait belt Transfers/Ambulation Related to ADLs: session focused on bed mobiilty and EOB <>chair <>EOB. pt not wanting to remain in chair due to pain at belly and right side. Difficult to fully assess due to aphasia. Pt required total +2 (A) for transfers with Rt LE buckling. pt demonstrates hyperextension at Rt knee during session as well. Pt initiates steps with Lt Le and facilitate weight shift. Pt implusive with transfers needing max cues ADL Comments: Pt supine on arrival. pt attempting to verbalize throughout session. pt said YeS and "I am ready to go walk to ?" OT was unable to determine last word in statement with multiple attempts. pt encouraged to use YES NO and thumbs up and down during session. Pt with LOB when not using LT UE support.   Cognition: Cognition Overall Cognitive Status: Difficult to assess Arousal/Alertness: Awake/alert Orientation Level: Oriented to person;Disoriented to time;Disoriented to situation;Disoriented to place (assessed  with questions and pt nodding head) Attention: Focused;Sustained Focused Attention: Appears intact Sustained Attention: Appears intact Memory:  (UTA) Awareness: Impaired Awareness Impairment: Emergent impairment Problem Solving: Impaired Problem Solving Impairment: Functional basic Cognition Arousal/Alertness: Awake/alert Behavior During Therapy: WFL for tasks assessed/performed Overall Cognitive Status: Difficult to assess Area of Impairment: Problem solving;Awareness Following Commands: Follows one step commands with increased time Problem Solving: Slow processing General Comments: pt responds correctly to questions and smiling during this session. pt uses yes no response correctly > 75 % of session Difficult to assess due to: Impaired  communication  Physical Exam: Blood pressure 117/62, pulse 68, temperature 97.7 F (36.5 C), temperature source Oral, resp. rate 22, height 5\' 8"  (1.727 m), weight 74.844 kg (165 lb), SpO2 97.00%. Physical Exam  Vitals reviewed.  HENT: thrush over tongue Head: Normocephalic.  Eyes:  Pupils reactive to light , sclera anicteric Neck: Neck supple. No thyromegaly present.no jvd or LAD  Cardiovascular:  Cardiac rate control, no murmurs rubs or gallops,   Pulmonary/Chest: Effort normal and breath sounds normal. No respiratory distress,  Abdominal: Bowel sounds are normal. He exhibits mild distension. He has mild tenderness Neurological: He is alert.  Patient makes good eye contact with examiner. More alert today. Right central 7, right tongue deviation but improved control. Patient is aphasic. Does occasionally grunt, wife reports one word answers. He does not y/n correctly 50-75%. He did follow simple one-step commands with delay but fairly consistent. Has somewhat of a left gaze preference. Dense RUE 0/5 with decreased but present sense of pain. RLE is 1 to 1+ with HE and KE--0/5 distally. Senses pain more in the right leg.  Skin: Skin is warm and dry   Results for orders placed during the hospital encounter of 03/08/13 (from the past 48 hour(s))  CBC     Status: Abnormal   Collection Time    03/12/13  5:40 AM      Result Value Range   WBC 11.1 (*) 4.0 - 10.5 K/uL   RBC 4.05 (*) 4.22 - 5.81 MIL/uL   Hemoglobin 13.2  13.0 - 17.0 g/dL   HCT 45.4 (*) 09.8 - 11.9 %   MCV 93.6  78.0 - 100.0 fL   MCH 32.6  26.0 - 34.0 pg   MCHC 34.8  30.0 - 36.0 g/dL   RDW 14.7  82.9 - 56.2 %   Platelets 185  150 - 400 K/uL  CBC     Status: Abnormal   Collection Time    03/13/13  5:13 AM      Result Value Range   WBC 14.2 (*) 4.0 - 10.5 K/uL   RBC 4.06 (*) 4.22 - 5.81 MIL/uL   Hemoglobin 13.3  13.0 - 17.0 g/dL   HCT 13.0 (*) 86.5 - 78.4 %   MCV 93.6  78.0 - 100.0 fL   MCH 32.8  26.0 - 34.0 pg    MCHC 35.0  30.0 - 36.0 g/dL   RDW 69.6  29.5 - 28.4 %   Platelets 183  150 - 400 K/uL   Dg Chest Port 1 View  03/11/2013   *RADIOLOGY REPORT*  Clinical Data: Rule out aspiration  PORTABLE CHEST - 1 VIEW  Comparison: Chest radiograph 03/09/2013  Findings: Left-sided pacemaker overlies normal cardiac silhouette. There is a left pleural effusion not changed on paired to prior. There is atelectasis at the left  lung base which also unchanged. Right lung is relatively clear.  No pneumothorax  IMPRESSION: 1.  No significant  change. 2.  Left lower lobe atelectasis and effusion.   Original Report Authenticated By: Genevive Bi, M.D.    Post Admission Physician Evaluation: 1. Functional deficits secondary  to external capsule infarct, likely embolic. 2. Patient is admitted to receive collaborative, interdisciplinary care between the physiatrist, rehab nursing staff, and therapy team. 3. Patient's level of medical complexity and substantial therapy needs in context of that medical necessity cannot be provided at a lesser intensity of care such as a SNF. 4. Patient has experienced substantial functional loss from his/her baseline which was documented above under the "Functional History" and "Functional Status" headings.  Judging by the patient's diagnosis, physical exam, and functional history, the patient has potential for functional progress which will result in measurable gains while on inpatient rehab.  These gains will be of substantial and practical use upon discharge  in facilitating mobility and self-care at the household level. 5. Physiatrist will provide 24 hour management of medical needs as well as oversight of the therapy plan/treatment and provide guidance as appropriate regarding the interaction of the two. 6. 24 hour rehab nursing will assist with bladder management, bowel management, safety, skin/wound care, disease management, medication administration, pain management and patient education   and help integrate therapy concepts, techniques,education, etc. 7. PT will assess and treat for/with: Lower extremity strength, range of motion, stamina, balance, functional mobility, safety, adaptive techniques and equipment, NMR, visual-spatial awareness, cognitive perceptual awareness.   Goals are: mod assist. 8. OT will assess and treat for/with: ADL's, functional mobility, safety, upper extremity strength, adaptive techniques and equipment, NMR, visual-spatial awareness, cognitive perceptual awareness.   Goals are: moderate assist.. 9. SLP will assess and treat for/with: language, communication, speech, swallow.  Goals are: mod to max assist. 10. Case Management and Social Worker will assess and treat for psychological issues and discharge planning. 11. Team conference will be held weekly to assess progress toward goals and to determine barriers to discharge. 12. Patient will receive at least 3 hours of therapy per day at least 5 days per week. 13. ELOS: 3 weeks      Prognosis:  good   Medical Problem List and Plan: 1. Left basal ganglia, external capsule infarct felt to be embolic 2. DVT Prophylaxis/Anticoagulation: Eliquis 3. Pain Management: Tylenol as needed 4. Neuropsych: This patient is not capable of making decisions on his/her own behalf. 5. Dysphasia. Dysphagia 2 nectar thick liquids. Monitor for any signs of aspiration. Followup speech therapy  -encourage adequate PO intake 6. PAF/CAD/ CABG. Cardiac rate controlled. Continue Eliquis as directed. Followup cardiology services as needed 7. Hyperlipidemia. Crestor 8. Glaucoma. Continue eyedrops 9. Irritable bowel syndrome.Amitiza twice a day and MiraLAX daily.  -acute service to give bowel prep before transfer?           Ranelle Oyster, MD, [facility] Physical Medicine & Rehabilitation  03/13/2013

## 2013-03-13 NOTE — Progress Notes (Signed)
a 

## 2013-03-13 NOTE — Progress Notes (Signed)
CIR Admissions: Have spoken with CM at [facility] re: possibility for pt to admit to CIR and, if needed, to be able to d/c to SNF for further rehab. I have faxed recent clinicals to [facility] and await answer. Pt's wife is in agreement with this plan. Have informed CSW of possible admit to CIR today. For questions: [phone]

## 2013-03-14 ENCOUNTER — Inpatient Hospital Stay (HOSPITAL_COMMUNITY): Payer: Medicare Other | Admitting: Occupational Therapy

## 2013-03-14 ENCOUNTER — Inpatient Hospital Stay (HOSPITAL_COMMUNITY): Payer: Medicare Other | Admitting: Physical Therapy

## 2013-03-14 ENCOUNTER — Inpatient Hospital Stay (HOSPITAL_COMMUNITY): Payer: Medicare Other | Admitting: Speech Pathology

## 2013-03-14 DIAGNOSIS — I6330 Cerebral infarction due to thrombosis of unspecified cerebral artery: Secondary | ICD-10-CM

## 2013-03-14 DIAGNOSIS — G8110 Spastic hemiplegia affecting unspecified side: Secondary | ICD-10-CM

## 2013-03-14 NOTE — Evaluation (Signed)
Occupational Therapy Assessment and Plan  Patient Details  Name: Frank Weeks MRN: [id number] Date of Birth: 1928-12-19  OT Diagnosis: apraxia, cognitive deficits, disturbance of vision, flaccid hemiplegia and hemiparesis, hemiplegia affecting dominant side and muscle weakness (generalized) Rehab Potential: Rehab Potential: Good ELOS: 4 weeks   Today's Date: 03/14/2013 Time: 1105-1205 and 1300-1310 Time Calculation (min): 60 min and 10 min  Problem List:  Patient Active Problem List   Diagnosis Date Noted  . Atrial flutter 03/11/2013  . Hemiparesis 03/08/2013  . Falls 06/17/2012  . Fatigue 06/12/2011  . Chest pain, non-cardiac   . Pacemaker-St.Jude   . DIASTOLIC HEART FAILURE, ACUTE 40/98/1191  . Atrial fibrillation 11/04/2009  . DYSLIPIDEMIA 07/08/2009  . GLAUCOMA 07/08/2009  . HYPERTENSION, UNSPECIFIED 07/08/2009  . CAD, ARTERY BYPASS GRAFT 07/08/2009  . Subarachnoid hemorrhage 07/08/2009  . CEREBROVASCULAR DISEASE 07/08/2009  . PERIPHERAL VASCULAR DISEASE 07/08/2009  . PLEURAL EFFUSION 07/08/2009  . GASTROESOPHAGEAL REFLUX DISEASE 07/08/2009  . 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 SHORTNESS OF BREATH 03/03/2009    Past Medical History:  Past Medical History  Diagnosis Date  . Coronary artery disease     a. 2007 CABGx3: LIMA->LAD, VG->RI, VG->RCA;  b. 2010 Aden MV: nonischemic.  . Bradycardia     a. s/p PPM.  . PVD (peripheral vascular disease)   . HTN (hypertension)   . Dyslipidemia   . Subarachnoid hemorrhage     a. aneurysmal 1964, s/p clipping.  . Cerebrovascular disease     a. s/p R CEA;  b. 02/2013 Carotid U/S: no signif ICA stenosis.  Marland Kitchen GERD (gastroesophageal reflux disease)   . Stroke     a. first @ age 54->memory deficits;  b. 02/2013 embolic stroke - left basal ganglia.  . Glaucoma   . Pleural effusion   . PAF (paroxysmal atrial fibrillation)     a. previously refused coumadin and felt to be poor coumadin candidate 2/2 falls/unsteady gait;   b. 02/2013 Echo: EF 55-60%, mild to mod AS.   Past Surgical History:  Past Surgical History  Procedure Laterality Date  . Coronary artery bypass graft  03/18/2006  . Right carotid endarectomy  09/2004  . Left carotid to vertebral bypass    . Dual-chamber pacemaker implantation    . Thoracentesis and bronchoscopy      Assessment & Plan Clinical Impression: Patient is a 79 y.o. right-handed male with history of CAD/CABG, PAF pacemaker, subarachnoid hemorrhage 50 years ago CVA. Admitted 03/08/2013 with fall reported by wife with right-sided weakness and slurred speech. Cranial CT showed developing low density left basal ganglia external capsule region consistent with acute infarct. Carotid Dopplers with no ICA stenosis. Echocardiogram with ejection fraction of 60% and normal systolic function. CT angiogram of the head showed a filling defect throughout the left M1 segment compatible with embolus causing acute infarct in left basal ganglia. Patient did not receive TPA. Neurology services consulted and placed on Eliquis for stroke prophylaxis in the setting of PAF . Patient is on a dysphagia 2 nectar thick liquid followed by speech therapy. Physical and occupational therapy evaluations completed 03/09/2013 recommendations made for physical medicine rehabilitation consult. Patient was felt to be a candidate for inpatient rehabilitation services and was admitted for comprehensive rehabilitation program.   Patient transferred to CIR on 03/13/2013 .    Patient currently requires total with basic self-care skills secondary to muscle weakness, impaired timing and sequencing, abnormal tone, unbalanced muscle activation, motor apraxia, decreased coordination and decreased motor planning, decreased visual perceptual  skills and decreased visual motor skills, decreased attention to right, decreased awareness, decreased problem solving and delayed processing and decreased sitting balance, decreased standing balance,  decreased postural control, hemiplegia and decreased balance strategies.  Prior to hospitalization, patient could complete ADLs with independent .  Patient will benefit from skilled intervention to decrease level of assist with basic self-care skills prior to discharge home with care partner.  Anticipate patient will require 24 hour supervision and minimal physical assistance and follow up home health.  OT - End of Session Activity Tolerance: Tolerates 30+ min activity without fatigue Endurance Deficit: No OT Assessment Rehab Potential: Good Barriers to Discharge: None OT Plan OT Intensity: Minimum of 1-2 x/day, 45 to 90 minutes OT Frequency: 5 out of 7 days OT Duration/Estimated Length of Stay: 4 weeks OT Treatment/Interventions: Balance/vestibular training;Cognitive remediation/compensation;Discharge planning;Disease mangement/prevention;DME/adaptive equipment instruction;Functional mobility training;Neuromuscular re-education;Pain management;Patient/family education;Psychosocial support;Self Care/advanced ADL retraining;Skin care/wound managment;Therapeutic Activities;Therapeutic Exercise;UE/LE Strength taining/ROM;UE/LE Coordination activities;Visual/perceptual remediation/compensation OT Recommendation Recommendations for Other Services: Neuropsych consult Patient destination: Home (may need SNF as wife can only provide supervision) Follow Up Recommendations: Home health OT (may need SNF as pt's wife unable to provide physical assist) Equipment Recommended: 3 in 1 bedside comode;Tub/shower seat   Skilled Therapeutic Intervention 1) OT eval, ADL assessment conducted at bed level, seated EOB, and w/c level. Pt with pushing tendencies and decreased sitting balance with LOB to Rt.  Squat pivot transfer to w/c with +2 assist secondary to pushing and requiring manual facilitation at hips for full pivot.  Transitioned back to bed secondary to bowel incontinence, engaged in rolling with  facilitation of proper placement of RUE and RLE to assist in rolling to Lt and use of grab bars when rolling to Rt for hygiene and LB dressing.  Returned to seated EOB with increased participation in pushing through elbow, however required min-mod assist to maintain static sitting balance during UB dressing. Pt aphasic requiring increased time for word finding during eval, pt's wife and daughter present and able to answer questions as needed.  2) Pt in bed upon arrival and reports fatigue from prior sessions.  Attempted to engage pt, however difficult due fatigue and keeping his eyes closed.  Pt would respond to his name and would attempt to answer, however would continue to return to eyes closed.  Repositioned pt in bed and propped RUE on pillow for improved positioning.    OT Evaluation Precautions/Restrictions  Precautions Precautions: Fall Precaution Comments: expressive aphasia Restrictions Weight Bearing Restrictions: No Pain Pain Assessment Pain Assessment: No/denies pain (no pain at rest, family notes pt c/o R hand pain w/ massage) Home Living/Prior Functioning Home Living Lives With: Spouse (son lives in [place], dtr lives in [place]) Available Help at Discharge: Family;Available 24 hours/day Type of Home: House (also have [place] Condo with elevator access ) Home Access: Stairs to enter Entrance Stairs-Number of Steps: 1 Entrance Stairs-Rails: None Home Layout: Two level;Able to live on main level with bedroom/bathroom Bathroom Shower/Tub: Walk-in shower;Door;Curtain (one step over) Firefighter: Standard Bathroom Accessibility: Yes How Accessible: Accessible via walker Home Adaptive Equipment: Walker - standard (was mother in laws) IADL History Homemaking Responsibilities: No Prior Function Level of Independence: Independent with basic ADLs;Independent with gait;Independent with transfers Able to Take Stairs?: Yes Driving: Yes Vocation: Retired ADL ADL Grooming:  Moderate assistance Where Assessed-Grooming: Sitting at sink Upper Body Bathing: Moderate assistance Where Assessed-Upper Body Bathing: Sitting at sink Lower Body Bathing: Maximal assistance Where Assessed-Lower Body Bathing: Sitting at sink;Bed level Upper Body  Dressing: Maximal assistance Where Assessed-Upper Body Dressing: Edge of bed Lower Body Dressing: Dependent Where Assessed-Lower Body Dressing: Bed level ADL Comments: Pt with pushing tendencies and LOB to Rt and posterior in sitting Vision/Perception  Vision - History Baseline Vision: Wears glasses all the time Patient Visual Report: No change from baseline Vision - Assessment Vision Assessment: Vision tested Ocular Range of Motion: Restricted on the left (?nystagmus when scanning to Lt) Saccades: Additional eye shifts occurred during testing;Undershoots Visual Fields: Right visual field deficit;Impaired - to be further tested in functional context Perception Perception: Impaired Inattention/Neglect: Does not attend to right side of body Praxis Praxis: Impaired Praxis Impairment Details: Initiation;Motor planning;Perseveration  Cognition Overall Cognitive Status: Impaired/Different from baseline Arousal/Alertness: Awake/alert Attention: Focused;Sustained Memory: Impaired Awareness: Impaired Problem Solving: Impaired Problem Solving Impairment: Functional basic Behaviors: Impulsive Safety/Judgment: Impaired Sensation Sensation Light Touch: Impaired by gross assessment Proprioception: Impaired by gross assessment Coordination Gross Motor Movements are Fluid and Coordinated: No Fine Motor Movements are Fluid and Coordinated: No Coordination and Movement Description: Rt hemiplegia Finger Nose Finger Test: RUE flaccid, increased time with LUE Mobility  Bed Mobility Bed Mobility: Rolling Right;Right Sidelying to Sit;Supine to Sit;Rolling Left Rolling Right: 4: Min guard;With rail Rolling Left: 3: Mod  assist Rolling Left Details: Tactile cues for initiation;Tactile cues for placement;Verbal cues for sequencing;Verbal cues for technique Right Sidelying to Sit: 2: Max assist;With rails;HOB flat Right Sidelying to Sit Details: Manual facilitation for weight shifting Sitting - Scoot to Edge of Bed: 3: Mod assist Transfers Sit to Stand: 2: Max assist;With upper extremity assist;From bed  Extremity/Trunk Assessment RUE Assessment RUE Assessment: Exceptions to [facility] (flaccid) LUE Assessment LUE Assessment: Within Functional Limits  FIM:  FIM - Eating Eating Activity: 5: Needs verbal cues/supervision;5: Set-up assist for cut food;5: Set-up assist for open containers;4: Help with managing cup/glass;4: Helper checks for pocketed food;4: Help with picking up utensils FIM - Grooming Grooming Steps: Wash, rinse, dry face Grooming: 2: Patient completes 1 of 4 or 2 of 5 steps FIM - Bathing Bathing Steps Patient Completed: Chest;Abdomen;Left upper leg;Left lower leg (including foot) Bathing: 2: Max-Patient completes 3-4 36f 10 parts or 25-49% FIM - Upper Body Dressing/Undressing Upper body dressing/undressing steps patient completed: Thread/unthread left sleeve of pullover shirt/dress Upper body dressing/undressing: 2: Max-Patient completed 25-49% of tasks FIM - Lower Body Dressing/Undressing Lower body dressing/undressing: 1: Total-Patient completed less than 25% of tasks FIM - Bed/Chair Transfer Bed/Chair Transfer: 2: Supine > Sit: Max A (lifting assist/Pt. 25-49%);2: Bed > Chair or W/C: Max A (lift and lower assist);2: Chair or W/C > Bed: Max A (lift and lower assist);2: Sit > Supine: Max A (lifting assist/Pt. 25-49%);1: Two helpers   Refer to Care Plan for Long Term Goals  Recommendations for other services: Neuropsych  Discharge Criteria: Patient will be discharged from OT if patient refuses treatment 3 consecutive times without medical reason, if treatment goals not met, if there is a change  in medical status, if patient makes no progress towards goals or if patient is discharged from hospital.  The above assessment, treatment plan, treatment alternatives and goals were discussed and mutually agreed upon: by patient and by family  Leonette Monarch 03/14/2013, 3:38 PM

## 2013-03-14 NOTE — Evaluation (Signed)
Physical Therapy Assessment and Plan  Patient Details  Name: Frank Weeks MRN: [id number] Date of Birth: 10-Oct-1929  PT Diagnosis: Abnormal posture, Abnormality of gait and Hemiplegia dominant Rehab Potential: Good ELOS: 4 weeks   Today's Date: 03/14/2013 Time: 6295-2841 Time Calculation (min): 60 min  Problem List:  Patient Active Problem List   Diagnosis Date Noted  . Atrial flutter 03/11/2013  . Hemiparesis 03/08/2013  . Falls 06/17/2012  . Fatigue 06/12/2011  . Chest pain, non-cardiac   . Pacemaker-St.Jude   . DIASTOLIC HEART FAILURE, ACUTE 32/44/0102  . Atrial fibrillation 11/04/2009  . DYSLIPIDEMIA 07/08/2009  . GLAUCOMA 07/08/2009  . HYPERTENSION, UNSPECIFIED 07/08/2009  . CAD, ARTERY BYPASS GRAFT 07/08/2009  . Subarachnoid hemorrhage 07/08/2009  . CEREBROVASCULAR DISEASE 07/08/2009  . PERIPHERAL VASCULAR DISEASE 07/08/2009  . PLEURAL EFFUSION 07/08/2009  . GASTROESOPHAGEAL REFLUX DISEASE 07/08/2009  . 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 SHORTNESS OF BREATH 03/03/2009    Past Medical History:  Past Medical History  Diagnosis Date  . Coronary artery disease     a. 2007 CABGx3: LIMA->LAD, VG->RI, VG->RCA;  b. 2010 Aden MV: nonischemic.  . Bradycardia     a. s/p PPM.  . PVD (peripheral vascular disease)   . HTN (hypertension)   . Dyslipidemia   . Subarachnoid hemorrhage     a. aneurysmal 1964, s/p clipping.  . Cerebrovascular disease     a. s/p R CEA;  b. 02/2013 Carotid U/S: no signif ICA stenosis.  Marland Kitchen GERD (gastroesophageal reflux disease)   . Stroke     a. first @ age 3->memory deficits;  b. 02/2013 embolic stroke - left basal ganglia.  . Glaucoma   . Pleural effusion   . PAF (paroxysmal atrial fibrillation)     a. previously refused coumadin and felt to be poor coumadin candidate 2/2 falls/unsteady gait;  b. 02/2013 Echo: EF 55-60%, mild to mod AS.   Past Surgical History:  Past Surgical History  Procedure Laterality Date  . Coronary artery  bypass graft  03/18/2006  . Right carotid endarectomy  09/2004  . Left carotid to vertebral bypass    . Dual-chamber pacemaker implantation    . Thoracentesis and bronchoscopy      Assessment & Plan Clinical Impression: Frank Weeks is a 79 y.o. right-handed male with history of CAD/CABG, PAF pacemaker, subarachnoid hemorrhage 79 years ago CVA. Admitted 03/08/2013 with fall reported by wife with right-sided weakness and slurred speech. Cranial CT showed developing low density left basal ganglia external capsule region consistent with acute infarct. Carotid Dopplers with no ICA stenosis. Echocardiogram with ejection fraction of 60% and normal systolic function. CT angiogram of the head showed a filling defect throughout the left M1 segment compatible with embolus causing acute infarct in left basal ganglia. Patient did not receive TPA. Neurology services consulted and placed on Eliquis for stroke prophylaxis in the setting of PAF . Patient is on a dysphagia 2 nectar thick liquid followed by speech therapy. Physical and occupational therapy evaluations completed 03/09/2013 recommendations made for physical medicine rehabilitation consult.   Patient transferred to CIR on 03/13/2013 .   Patient currently requires max with mobility secondary to muscle weakness, muscle joint tightness and muscle paralysis and abnormal tone and decreased coordination.  Prior to hospitalization, patient was independent  with mobility and lived with Spouse (son lives in [place], dtr lives in [place]) in a [address] (also have [place] Condo with Engineer, structural access ) home.  Home access is 1Stairs to enter.  Patient will  benefit from skilled PT intervention to maximize safe functional mobility, minimize fall risk and decrease caregiver burden for planned discharge home with 24 hour supervision.  Anticipate patient will benefit from follow up HH at discharge.  PT - End of Session Activity Tolerance: Tolerates 30+ min activity with multiple  rests Endurance Deficit: Yes PT Assessment Rehab Potential: Good Barriers to Discharge: Decreased caregiver support (wife is unable to lift patient for transfers/gait) PT Plan PT Intensity: Minimum of 1-2 x/day ,45 to 90 minutes PT Frequency: 5 out of 7 days PT Duration Estimated Length of Stay: 4 weeks PT Treatment/Interventions: Ambulation/gait training;Balance/vestibular training;DME/adaptive equipment instruction;Functional mobility training;Neuromuscular re-education;Patient/family education;Therapeutic Activities;Therapeutic Exercise;UE/LE Strength taining/ROM PT Recommendation Follow Up Recommendations: Home health PT Patient destination: Home  Skilled Therapeutic Intervention   PT Evaluation Precautions/Restrictions Precautions Precautions: Fall Precaution Comments: expressive aphasia Restrictions Weight Bearing Restrictions: No General Chart Reviewed: Yes Family/Caregiver Present: Yes (wife (Dorothy), dtr Rosalita Chessman)) Vital SignsTherapy Vitals Temp: 97.8 F (36.6 C) Temp src: Oral Pulse Rate: 70 Resp: 18 BP: 113/76 mmHg Patient Position, if appropriate: Lying Oxygen Therapy SpO2: 97 % Pain Pain Assessment Pain Assessment: No/denies pain (no pain at rest, family notes pt c/o R hand pain w/ massage) Home Living/Prior Functioning Home Living Lives With: Spouse (son lives in [place], dtr lives in [place]) Available Help at Discharge: Family;Available 24 hours/day Type of Home: House (also have [place] Condo with elevator access ) Home Access: Stairs to enter Entrance Stairs-Number of Steps: 1 Entrance Stairs-Rails: None Home Layout: Two level;Able to live on main level with bedroom/bathroom Bathroom Shower/Tub: Walk-in shower;Door;Curtain (one step over) Firefighter: Standard Bathroom Accessibility: Yes How Accessible: Accessible via walker Prior Function Level of Independence: Independent with basic ADLs;Independent with gait;Independent with transfers Able  to Take Stairs?: Yes Driving: Yes Vocation: Retired Optometrist - History Baseline Vision: Wears glasses all the time  Cognition Overall Cognitive Status: Impaired/Different from baseline Arousal/Alertness: Awake/alert Attention: Focused;Sustained Memory: Impaired Problem Solving: Impaired Behaviors: Impulsive Safety/Judgment: Impaired Sensation Sensation Light Touch: Impaired by gross assessment Proprioception: Impaired by gross assessment Coordination Gross Motor Movements are Fluid and Coordinated: No Fine Motor Movements are Fluid and Coordinated: No Coordination and Movement Description: Rt hemiplegia Finger Nose Finger Test: RUE flaccid, increased time with LUE Heel Shin Test: R LE hemiplegia Motor  Motor Motor: Hemiplegia  Mobility Bed Mobility Bed Mobility: Rolling Right;Rolling Left;Right Sidelying to Sit;Left Sidelying to Sit;Sitting - Scoot to [place] of Bed;Sit to Sidelying Right;Sit to Sidelying Left;Scooting to [facility] Rolling Right: 4: Min assist Rolling Left: 3: Mod assist Rolling Left Details: Tactile cues for initiation;Tactile cues for placement;Verbal cues for sequencing;Verbal cues for technique Right Sidelying to Sit: 2: Max assist;With rails;HOB flat Right Sidelying to Sit Details: Manual facilitation for weight shifting Left Sidelying to Sit: 2: Max assist Sitting - Scoot to Edge of Bed: 3: Mod assist Sit to Sidelying Right: 1: +1 Total assist Sit to Sidelying Left: 2: Max assist Scooting to HOB: 1: +2 Total assist Transfers Sit to Stand: 2: Max assist;With upper extremity assist;From bed Stand to Sit: 2: Max assist;With upper extremity assist;To bed Stand Pivot Transfers: 2: Max Designer, television/film set Transfers: 1: +2 Total assist Squat Pivot Transfer Details: Verbal cues for sequencing;Verbal cues for technique;Manual facilitation for weight shifting;Manual facilitation for placement Locomotion  Ambulation Ambulation/Gait Assistance: Not  tested (comment) Stairs / Additional Locomotion Curb: Not tested (comment) Wheelchair Mobility Wheelchair Mobility: No  Trunk/Postural Assessment  Postural Control Postural Control: Deficits on evaluation (leans to the rightsitting  EOB)  Balance Balance Balance Assessed: Yes Static Sitting Balance Static Sitting - Balance Support: Feet supported;Left upper extremity supported Static Sitting - Level of Assistance: 4: Min assist Dynamic Sitting Balance Dynamic Sitting - Balance Support: Feet supported;No upper extremity supported Dynamic Sitting - Level of Assistance: 4: Min Oncologist Standing - Balance Support: Bilateral upper extremity supported Static Standing - Level of Assistance: 2: Max assist Dynamic Standing Balance Dynamic Standing - Balance Support: Bilateral upper extremity supported Dynamic Standing - Level of Assistance: 1: +1 Total assist Extremity Assessment  RLE Assessment RLE Assessment: Exceptions to WFL (R LE grossly 0/5) LLE Assessment LLE Assessment: Within Functional Limits  FIM:  FIM - Locomotion: Ambulation Ambulation/Gait Assistance: Not tested (comment)   Refer to Care Plan for Long Term Goals  Recommendations for other services: None  Discharge Criteria: Patient will be discharged from PT if patient refuses treatment 3 consecutive times without medical reason, if treatment goals not met, if there is a change in medical status, if patient makes no progress towards goals or if patient is discharged from hospital.  The above assessment, treatment plan, treatment alternatives and goals were discussed and mutually agreed upon: by patient and by family  Rex Kras 03/14/2013, 4:47 PM

## 2013-03-14 NOTE — Evaluation (Signed)
Speech Language Pathology Assessment and Plan  Patient Details  Name: EDMAN LIPSEY MRN: [id number] Date of Birth: 1929-07-21  SLP Diagnosis: Cognitive Impairments;Dysphagia;Speech and Language deficits;Dysarthria;Aphasia  Rehab Potential: Good ELOS: 3 weeks   Today's Date: 03/14/2013 Time: 0810-0900 Time Calculation (min): 50 min  Problem List:  Patient Active Problem List   Diagnosis Date Noted  . Atrial flutter 03/11/2013  . Hemiparesis 03/08/2013  . Falls 06/17/2012  . Fatigue 06/12/2011  . Chest pain, non-cardiac   . Pacemaker-[employer]   . DIASTOLIC HEART FAILURE, ACUTE 40/98/1191  . Atrial fibrillation 11/04/2009  . DYSLIPIDEMIA 07/08/2009  . GLAUCOMA 07/08/2009  . HYPERTENSION, UNSPECIFIED 07/08/2009  . CAD, ARTERY BYPASS GRAFT 07/08/2009  . Subarachnoid hemorrhage 07/08/2009  . CEREBROVASCULAR DISEASE 07/08/2009  . PERIPHERAL VASCULAR DISEASE 07/08/2009  . PLEURAL EFFUSION 07/08/2009  . GASTROESOPHAGEAL REFLUX DISEASE 07/08/2009  . 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 SHORTNESS OF BREATH 03/03/2009   Past Medical History:  Past Medical History  Diagnosis Date  . Coronary artery disease     a. 2007 CABGx3: LIMA->LAD, VG->RI, VG->RCA;  b. 2010 Aden MV: nonischemic.  . Bradycardia     a. s/p PPM.  . PVD (peripheral vascular disease)   . HTN (hypertension)   . Dyslipidemia   . Subarachnoid hemorrhage     a. aneurysmal 1964, s/p clipping.  . Cerebrovascular disease     a. s/p R CEA;  b. 02/2013 Carotid U/S: no signif ICA stenosis.  Marland Kitchen GERD (gastroesophageal reflux disease)   . Stroke     a. first @ age 49->memory deficits;  b. 02/2013 embolic stroke - left basal ganglia.  . Glaucoma   . Pleural effusion   . PAF (paroxysmal atrial fibrillation)     a. previously refused coumadin and felt to be poor coumadin candidate 2/2 falls/unsteady gait;  b. 02/2013 Echo: EF 55-60%, mild to mod AS.   Past Surgical History:  Past Surgical History  Procedure  Laterality Date  . Coronary artery bypass graft  03/18/2006  . Right carotid endarectomy  09/2004  . Left carotid to vertebral bypass    . Dual-chamber pacemaker implantation    . Thoracentesis and bronchoscopy      Assessment / Plan / Recommendation Clinical Impression  The patient is an 79 year old right-handed male with history of CAD/CABG, PAF pacemaker, subarachnoid hemorrhage 50 years ago.  He was admitted on 03/08/2013, with fall reported by wife.  He had right-sided weakness and slurred speech. Cranial CT showed developing low density left basal ganglia external capsule region consistent with acute infarct. CT angiogram of the head showed a filling defect throughout the left M1 segment compatible with embolus causing acute infarct in left basal ganglia. The patient is now admitted to inpatient rehabilitation services for intensive rehabilitation therapy.  Speech Therapy evaluation revealed cognitive, linguistic, and swallowing deficits, requiring therapy to improve auditory comprehension, expressive language, swallowing dysfunction.      SLP Assessment  Patient will need skilled Speech Lanaguage Pathology Services during CIR admission    Recommendations  Recommended Consults: MBS Diet Recommendations: Dysphagia 2 (Fine chop);Nectar-thick liquid Liquid Administration via: Cup;Straw Medication Administration: Crushed with puree Supervision: Full supervision/cueing for compensatory strategies Compensations: Slow rate;Small sips/bites;Check for pocketing Postural Changes and/or Swallow Maneuvers: Seated upright 90 degrees Oral Care Recommendations: Oral care BID Patient destination: Home Follow up Recommendations: Home Health SLP Equipment Recommended: None recommended by SLP    SLP Frequency 5 out of 7 days   SLP Treatment/Interventions Cognitive remediation/compensation;Dysphagia/aspiration precaution training;Patient/family education;Oral  motor exercises;Therapeutic  Exercise;Therapeutic Activities    Pain Pain Assessment Pain Assessment: No/denies pain Prior Functioning    Short Term Goals: Week 1: SLP Short Term Goal 1 (Week 1): Patient will perform oral motor exercises with min A multimodal cueing to improve labial/lingual strength. (New goal) SLP Short Term Goal 2 (Week 1): Patient will perform pharyngeal strengthening exercises with min A multimodal cueing to decrease aspiration risk.  SLP Short Term Goal 3 (Week 1): Patient will answer yes/no questions with 90% accuracy.  SLP Short Term Goal 4 (Week 1): Patient will follow one-step commands with 90% accuracy.  SLP Short Term Goal 5 (Week 1): Patient will use compensatory strategies to increase speech intelligibility with min A.   See FIM for current functional status Refer to Care Plan for Long Term Goals  Recommendations for other services: None  Discharge Criteria: Patient will be discharged from SLP if patient refuses treatment 3 consecutive times without medical reason, if treatment goals not met, if there is a change in medical status, if patient makes no progress towards goals or if patient is discharged from hospital.  The above assessment, treatment plan, treatment alternatives and goals were discussed and mutually agreed upon: by family  Lenny Pastel 03/14/2013, 4:16 PM

## 2013-03-14 NOTE — Progress Notes (Signed)
  HPI: Frank Weeks is a 79 y.o. right-handed male with history of CAD/CABG, PAF pacemaker, subarachnoid hemorrhage 50 years ago CVA. Admitted 03/08/2013 with fall reported by wife with right-sided weakness and slurred speech. Cranial CT showed developing low density left basal ganglia external capsule region consistent with acute infarct. Carotid Dopplers with no ICA stenosis. Echocardiogram with ejection fraction of 60% and normal systolic function. CT angiogram of the head showed a filling defect throughout the left M1 segment compatible with embolus causing acute infarct in left basal ganglia. Patient did not receive TPA. Neurology services consulted and placed on Eliquis for stroke prophylaxis in the setting of PAF . Patient is on a dysphagia 2 nectar thick liquid followed by speech therapy. Physical and occupational therapy evaluations completed 03/09/2013 recommendations made for physical medicine rehabilitation consult. Patient was felt to be a candidate for inpatient rehabilitation services and was admitted for comprehensive rehabilitation program  Subjective: Patient denies chest pain, shortness of breath. He does admit to weakness.  Review of Systems  Neurological: Positive for weakness.   patient admits to weakness no other specific complaints.  Physical Exam: Blood pressure 113/76, pulse 70, temperature 97.8 F (36.6 C), temperature source Oral, resp. rate 18, height 5\' 8"  (1.727 m), weight 172 lb 2.9 oz (78.1 kg), SpO2 97.00%.  Elderly male in no acute distress. Chest clear to auscultation. Cardiac exam S1-S2 are regular. Abdominal exam active bowel sounds, soft. Extremities no edema.  Results for orders placed during the hospital encounter of 03/08/13 (from the past 48 hour(s))  CBC     Status: Abnormal   Collection Time    03/13/13  5:13 AM      Result Value Range   WBC 14.2 (*) 4.0 - 10.5 K/uL   RBC 4.06 (*) 4.22 - 5.81 MIL/uL   Hemoglobin 13.3  13.0 - 17.0 g/dL   HCT 16.1 (*)  09.6 - 52.0 %   MCV 93.6  78.0 - 100.0 fL   MCH 32.8  26.0 - 34.0 pg   MCHC 35.0  30.0 - 36.0 g/dL   RDW 04.5  40.9 - 81.1 %   Platelets 183  150 - 400 K/uL   No results found.   1. Functional deficits secondary  to external capsule infarct, likely embolic.  Medical Problem List and Plan: 1. Left basal ganglia, external capsule infarct felt to be embolic 2. DVT Prophylaxis/Anticoagulation: Eliquis 3. Pain Management: Tylenol as needed 4. Neuropsych: This patient is not capable of making decisions on his/her own behalf. 5. Dysphasia. Dysphagia 2 nectar thick liquids. Monitor for any signs of aspiration. Followup speech therapy  -encourage adequate PO intake 6. PAF/CAD/ CABG. Cardiac rate controlled. Continue Eliquis as directed. Followup cardiology services as needed 7. Hyperlipidemia. Crestor 8. Glaucoma. Continue eyedrops 9. Irritable bowel syndrome.Amitiza twice a day and MiraLAX daily.  -acute service to give bowel prep before transfer?

## 2013-03-15 ENCOUNTER — Inpatient Hospital Stay (HOSPITAL_COMMUNITY): Payer: Medicare Other

## 2013-03-15 MED ORDER — TRAZODONE HCL 50 MG PO TABS
25.0000 mg | ORAL_TABLET | Freq: Every evening | ORAL | Status: DC | PRN
  Administered 2013-03-15 – 2013-03-18 (×3): 25 mg via ORAL
  Filled 2013-03-15 (×3): qty 1

## 2013-03-15 NOTE — Progress Notes (Signed)
  HPI: Frank Weeks is a 79 y.o. right-handed male with history of CAD/CABG, PAF pacemaker, subarachnoid hemorrhage 50 years ago CVA. Admitted 03/08/2013 with fall reported by wife with right-sided weakness and slurred speech. Cranial CT showed developing low density left basal ganglia external capsule region consistent with acute infarct. Carotid Dopplers with no ICA stenosis. Echocardiogram with ejection fraction of 60% and normal systolic function. CT angiogram of the head showed a filling defect throughout the left M1 segment compatible with embolus causing acute infarct in left basal ganglia. Patient did not receive TPA. Neurology services consulted and placed on Eliquis for stroke prophylaxis in the setting of PAF . Patient is on a dysphagia 2 nectar thick liquid followed by speech therapy. Physical and occupational therapy evaluations completed 03/09/2013 recommendations made for physical medicine rehabilitation consult. Patient was felt to be a candidate for inpatient rehabilitation services and was admitted for comprehensive rehabilitation program  Subjective: Patient is unable to speak but he denies chest pain, shortness of breath. He admits to being weak when asked. He answers questions shaking his head yes or no.  Review of Systems  Neurological: Positive for weakness.   patient admits to weakness no other specific complaints.  Physical Exam: Blood pressure 113/76, pulse 70, temperature 97.8 F (36.6 C), temperature source Oral, resp. rate 18, height 5\' 8"  (1.727 m), weight 172 lb 2.9 oz (78.1 kg), SpO2 97.00%.  Elderly male in no acute distress. Chest clear to auscultation. Cardiac exam S1-S2 are regular. Abdominal exam active bowel sounds, soft. Extremities no edema.  Results for orders placed during the hospital encounter of 03/08/13 (from the past 48 hour(s))  CBC     Status: Abnormal   Collection Time    03/13/13  5:13 AM      Result Value Range   WBC 14.2 (*) 4.0 - 10.5 K/uL   RBC 4.06 (*) 4.22 - 5.81 MIL/uL   Hemoglobin 13.3  13.0 - 17.0 g/dL   HCT 65.7 (*) 84.6 - 96.2 %   MCV 93.6  78.0 - 100.0 fL   MCH 32.8  26.0 - 34.0 pg   MCHC 35.0  30.0 - 36.0 g/dL   RDW 95.2  84.1 - 32.4 %   Platelets 183  150 - 400 K/uL   No results found.   1. Functional deficits secondary  to external capsule infarct, likely embolic.  Medical Problem List and Plan: 1. Left basal ganglia, external capsule infarct felt to be embolic 2. DVT Prophylaxis/Anticoagulation: Eliquis 3. Pain Management: Tylenol as needed 4. Neuropsych: This patient is not capable of making decisions on his/her own behalf. 5. Dysphasia. Dysphagia 2 nectar thick liquids. Monitor for any signs of aspiration. Followup speech therapy  -encourage adequate PO intake 6. PAF/CAD/ CABG. Cardiac rate controlled. Continue Eliquis as directed. Followup cardiology services as needed 7. Hyperlipidemia. Crestor 8. Glaucoma. Continue eyedrops 9. Irritable bowel syndrome.Amitiza twice a day and MiraLAX daily.  -acute service to give bowel prep before transfer?

## 2013-03-15 NOTE — Plan of Care (Signed)
Problem: RH BOWEL ELIMINATION Goal: RH STG MANAGE BOWEL W/MEDICATION W/ASSISTANCE STG Manage Bowel with Medication with mod Assistance.  Outcome: Progressing Mag citrate on 5/31 effective in producing BM

## 2013-03-15 NOTE — Plan of Care (Signed)
Problem: RH BOWEL ELIMINATION Goal: RH STG MANAGE BOWEL WITH ASSISTANCE STG Manage Bowel with mod Assistance.  Outcome: Progressing Incont of bowel but able to communicate when he needs to be cleaned.

## 2013-03-15 NOTE — Progress Notes (Signed)
Physical Therapy Session Note  Patient Details  Name: TARANCE BALAN MRN: [id number] Date of Birth: 1929-01-13  Today's Date: 03/15/2013 Time: 1400-1440 Time Calculation (min): 40 min  Skilled Therapeutic Interventions/Progress Updates:   Pt reports not feeling well; denies pain or nausea/upset stomach but shakes head yes to being very tired. Pt declined attempt at OOB at beginning and again at end of session. Focused on supine stretching and ROM to RLE and strengthening exercises on LLE including hip abduction/adduction, heel slides, and ankle pumps. No active movement noted on RLE.Pt becoming frustrated with inability to express himself - attempted writing with L hand (pt is R handed) but also difficult for pt. Family arrived at end of session and educated on status today as well primary therapy team.   Therapy Documentation Precautions:  Precautions Precautions: Fall Precaution Comments: expressive aphasia Restrictions Weight Bearing Restrictions: No  Pain:  Denies pain.  See FIM for current functional status  Therapy/Group: Individual Therapy  Karolee Stamps [facility] 03/15/2013, 2:43 PM

## 2013-03-16 ENCOUNTER — Inpatient Hospital Stay (HOSPITAL_COMMUNITY): Payer: Medicare Other | Admitting: Physical Therapy

## 2013-03-16 ENCOUNTER — Inpatient Hospital Stay (HOSPITAL_COMMUNITY): Payer: Medicare Other | Admitting: Occupational Therapy

## 2013-03-16 ENCOUNTER — Inpatient Hospital Stay (HOSPITAL_COMMUNITY): Payer: Medicare Other | Admitting: Speech Pathology

## 2013-03-16 DIAGNOSIS — I6340 Cerebral infarction due to embolism of unspecified cerebral artery: Secondary | ICD-10-CM

## 2013-03-16 DIAGNOSIS — G8110 Spastic hemiplegia affecting unspecified side: Secondary | ICD-10-CM

## 2013-03-16 DIAGNOSIS — I6330 Cerebral infarction due to thrombosis of unspecified cerebral artery: Secondary | ICD-10-CM

## 2013-03-16 LAB — COMPREHENSIVE METABOLIC PANEL
ALT: 18 U/L (ref 0–53)
Alkaline Phosphatase: 103 U/L (ref 39–117)
CO2: 22 mEq/L (ref 19–32)
Chloride: 99 mEq/L (ref 96–112)
GFR calc Af Amer: 89 mL/min — ABNORMAL LOW (ref >90)
GFR calc non Af Amer: 77 mL/min — ABNORMAL LOW (ref >90)
Glucose, Bld: 106 mg/dL — ABNORMAL HIGH (ref 70–99)
Potassium: 4.6 mEq/L (ref 3.5–5.1)
Sodium: 133 mEq/L — ABNORMAL LOW (ref 135–145)
Total Bilirubin: 0.4 mg/dL (ref 0.3–1.2)
Total Protein: 7.8 g/dL (ref 6.0–8.3)

## 2013-03-16 LAB — CBC WITH DIFFERENTIAL/PLATELET
Hemoglobin: 14.8 g/dL (ref 13.0–17.0)
Lymphocytes Relative: 8 % — ABNORMAL LOW (ref 12–46)
Lymphs Abs: 0.8 10*3/uL (ref 0.7–4.0)
Neutro Abs: 6.9 10*3/uL (ref 1.7–7.7)
Neutrophils Relative %: 75 % (ref 43–77)
Platelets: 244 10*3/uL (ref 150–400)
RBC: 4.58 MIL/uL (ref 4.22–5.81)
WBC: 9.2 10*3/uL (ref 4.0–10.5)

## 2013-03-16 MED ORDER — ENSURE PUDDING PO PUDG
1.0000 | Freq: Four times a day (QID) | ORAL | Status: DC
  Administered 2013-03-16 – 2013-04-06 (×63): 1 via ORAL

## 2013-03-16 NOTE — Progress Notes (Signed)
Physical Therapy Session Note  Patient Details  Name: Frank Weeks MRN: [id number] Date of Birth: [birth year]  Today's Date: 03/16/2013 Time: 1415-1510 Time Calculation (min): 55 min  Short Term Goals: Week 1:  PT Short Term Goal 1 (Week 1): patient will be able to perform bed mobility with mod-Assist. PT Short Term Goal 2 (Week 1): Patient will be able to perform transfers with Mod-Assist. PT Short Term Goal 3 (Week 1): Patient will be able to ambulate 10' using LRAD with Max-Assist  PT Short Term Goal 4 (Week 1): Patient will be able to maintain dynamic sitting balance with Mod-assist  Skilled Therapeutic Interventions/Progress Updates:   Patient very fatigued but willing to participate.  Performed transfer w/c > mat to R side, mat > w/c to L and w/c > bed to R with max A with manual facilitation for trunk elongation during anterior lean to L, lifting assistance and assistance for full pivot.  Performed sit <> supine on mat and sit > supine on bed with mod A for controlled lowering of trunk and to bring RLE onto mat or bed.  In supine on mat performed RLE hip ADD and hip flexion and quad stretch with RLE off side of mat; patient noted to have some flexor withdrawal during stretch.  Also performed bilat LE and lower trunk rotations to L and R for increased ROM in trunk and hips; patient grimacing in pain during L rotation and using excess extension on LLE to rotate to R.  Performed supine>sit edge of mat with max A with verbal cues for rolling sequence and significant lifting assistance needed to bring trunk upright and weight shift to L side.  Once seated performed NMR for sitting balance and trunk control at edge of mat with reaching with LUE in various directions out of BOS for rings with focus on head righting to maintain balance and use of lateral trunk elongation and shortening for reaching to L, R and forwards.  Patient with multiple episodes of L, posterior, R and forwards LOB with max-total A  to recover.  Returned to w/c and to supine in bed to rest.  Patient very fatigued at end of session; will discuss with team if patient needs to be 15/7 for increased activity tolerance to therapy.    Therapy Documentation Precautions:  Precautions Precautions: Fall Precaution Comments: expressive aphasia Restrictions Weight Bearing Restrictions: No Pain: Pain Assessment Pain Assessment: No/denies pain at rest but during mobility patient complains of R hip lateral and anterior pain; denies falling on R side  See FIM for current functional status  Therapy/Group: Individual Therapy  [name] [facility] 03/16/2013, 3:53 PM

## 2013-03-16 NOTE — Progress Notes (Signed)
Occupational Therapy Session Note  Patient Details  Name: Frank Weeks MRN: [id number] Date of Birth: [birth year]  Today's Date: 03/16/2013 Time: 1030-1130 Time Calculation (min): 60 min  Short Term Goals: Week 1:  OT Short Term Goal 1 (Week 1): Pt will sit EOB for 2 mins with supervision during self-care task  OT Short Term Goal 2 (Week 1): Pt will complete UB bathing with min assist and less than 25% cues OT Short Term Goal 3 (Week 1): Pt will complete UB dressing with min assist OT Short Term Goal 4 (Week 1): Pt will complete LB bathing with mod assist OT Short Term Goal 5 (Week 1): Pt will complete squat pivot transfer to [facility] with max assist of 1 person  Skilled Therapeutic Interventions/Progress Updates:    Pt seen for ADL retraining with focus on bed mobility, transfers, and sit <> stand.  Pt in bed upon arrival and reports need to toilet.  Pt completed rolling to Rt with mod facilitation and setup of RUE and RLE to increase positioning to assist in rolling to sidelying. Max assist sidelying to sit with use of bed rail.  Stand pivot to Lt to [facility] with +2 assist to complete pivot secondary to hesitancy to pivot to Lt (pushing tendency?).  Engaged in sit <> stand x7 for hygiene with this therapist providing mod assist sit > stand and tactile cues to Rt knee to prevent buckling while 2nd person completed hygiene.  Pt required mod verbal cues for hand placement to push up from [facility] and not pull on therapist.  In standing pt benefited from LUE resting on therapist's shoulders to promote upright standing.  Pt progressed to increased participation and weight shift forward for sit to stand throughout session.  Completed LB dressing at sit <> stand level as well with +2 to pull up pants while standing with this therapist.  Pt with decreased verbalizations throughout session but appeared to respond appropriately to yes/no questions.  Pt returned to bed with stand pivot transfer with +2 and +2 assist for sit  to supine to improve positioning.  Therapy Documentation Precautions:  Precautions Precautions: Fall Precaution Comments: expressive aphasia Restrictions Weight Bearing Restrictions: No Pain: Pain Assessment Pain Assessment: No/denies pain  See FIM for current functional status  Therapy/Group: Individual Therapy  Leonette Monarch 03/16/2013, 1:19 PM

## 2013-03-16 NOTE — Progress Notes (Addendum)
Speech Language Pathology Daily Session Note  Patient Details  Name: Frank Weeks MRN: [id number] Date of Birth: 1929/09/15  Today's Date: 03/16/2013 Time: 0900-0930 Time Calculation (min): 30 min  Short Term Goals: Week 1: SLP Short Term Goal 1 (Week 1): Patient will perform oral motor exercises with min A multimodal cueing to improve labial/lingual strength. (New goal) SLP Short Term Goal 2 (Week 1): Patient will perform pharyngeal strengthening exercises with min A multimodal cueing to decrease aspiration risk.  SLP Short Term Goal 3 (Week 1): Patient will answer yes/no questions with 90% accuracy.  SLP Short Term Goal 4 (Week 1): Patient will follow one-step commands with 90% accuracy.  SLP Short Term Goal 5 (Week 1): Patient will use compensatory strategies to increase speech intelligibility with min A.   Skilled Therapeutic Interventions: Therapy focused on language intervention included following 1 step command to 90% without cues, yes/no questions with 70% accuracy.  SLP noted possible pocketed food in right buccal cavity and upon further inspection removed large amount off eggs/sausage from right upper and lower buccal cavity.  Pt.'s verbal apraxia significantly decreases intelligibility at the word level.  Pt. with many sound substitutions and omissions in the initial and final positions and requires verbal and visual cues to produce.  Decreased emergent awareness to errors.  SLP used writing as a tool to respond the questions, however he was uanble without max visual cues.  Pt. able to write first letter of first name independently and copied letters in name with 100%. acc.     FIM:  Comprehension Comprehension: 3-Understands basic 50 - 74% of the time/requires cueing 25 - 50%  of the time Expression Expression: 1-Expresses basis less than 25% of the time/requires cueing greater than 75% of the time. Social Interaction Social Interaction: 4-Interacts appropriately 75 - 89% of the  time - Needs redirection for appropriate language or to initiate interaction. Problem Solving Problem Solving: 3-Solves basic 50 - 74% of the time/requires cueing 25 - 49% of the time Memory Memory: 3-Recognizes or recalls 50 - 74% of the time/requires cueing 25 - 49% of the time  Pain Pain Assessment Pain Assessment: No/denies pain  Therapy/Group: Individual Therapy  Breck Coons [place].Ed [employer] [phone]  03/16/2013

## 2013-03-16 NOTE — Progress Notes (Signed)
Patient ID: Frank Weeks, male   DOB: 1928-11-01, 79 y.o.   MRN: [id number] Subjective/Complaints: 79 y.o. right-handed male with history of CAD/CABG, PAF pacemaker, subarachnoid hemorrhage 50 years ago CVA. Admitted 03/08/2013 with fall reported by wife with right-sided weakness and slurred speech. Cranial CT showed developing low density left basal ganglia external capsule region consistent with acute infarct. Carotid Dopplers with no ICA stenosis. Echocardiogram with ejection fraction of 60% and normal systolic function. CT angiogram of the head showed a filling defect throughout the left M1 segment compatible with embolus causing acute infarct in left basal ganglia.  Aphasic   Review of Systems  Unable to perform ROS: medical condition   Objective: Vital Signs: Blood pressure 109/76, pulse 71, temperature 98.6 F (37 C), temperature source Oral, resp. rate 20, height 5\' 8"  (1.727 m), weight 78.1 kg (172 lb 2.9 oz), SpO2 94.00%. No results found. Results for orders placed during the hospital encounter of 03/13/13 (from the past 72 hour(s))  CBC WITH DIFFERENTIAL     Status: Abnormal   Collection Time    03/16/13  5:27 AM      Result Value Range   WBC 9.2  4.0 - 10.5 K/uL   RBC 4.58  4.22 - 5.81 MIL/uL   Hemoglobin 14.8  13.0 - 17.0 g/dL   HCT 63.8  75.6 - 43.3 %   MCV 94.8  78.0 - 100.0 fL   MCH 32.3  26.0 - 34.0 pg   MCHC 34.1  30.0 - 36.0 g/dL   RDW 29.5  18.8 - 41.6 %   Platelets 244  150 - 400 K/uL   Neutrophils Relative % 75  43 - 77 %   Neutro Abs 6.9  1.7 - 7.7 K/uL   Lymphocytes Relative 8 (*) 12 - 46 %   Lymphs Abs 0.8  0.7 - 4.0 K/uL   Monocytes Relative 14 (*) 3 - 12 %   Monocytes Absolute 1.3 (*) 0.1 - 1.0 K/uL   Eosinophils Relative 2  0 - 5 %   Eosinophils Absolute 0.2  0.0 - 0.7 K/uL   Basophils Relative 1  0 - 1 %   Basophils Absolute 0.1  0.0 - 0.1 K/uL  COMPREHENSIVE METABOLIC PANEL     Status: Abnormal   Collection Time    03/16/13  5:27 AM      Result  Value Range   Sodium 133 (*) 135 - 145 mEq/L   Potassium 4.6  3.5 - 5.1 mEq/L   Chloride 99  96 - 112 mEq/L   CO2 22  19 - 32 mEq/L   Glucose, Bld 106 (*) 70 - 99 mg/dL   BUN 23  6 - 23 mg/dL   Creatinine, Ser 6.06  0.50 - 1.35 mg/dL   Calcium 9.2  8.4 - 30.1 mg/dL   Total Protein 7.8  6.0 - 8.3 g/dL   Albumin 2.7 (*) 3.5 - 5.2 g/dL   AST 19  0 - 37 U/L   ALT 18  0 - 53 U/L   Alkaline Phosphatase 103  39 - 117 U/L   Total Bilirubin 0.4  0.3 - 1.2 mg/dL   GFR calc non Af Amer 77 (*) >90 mL/min   GFR calc Af Amer 89 (*) >90 mL/min   Comment:            The eGFR has been calculated     using the CKD EPI equation.     This calculation has not been  validated in all clinical     situations.     eGFR's persistently     <90 mL/min signify     possible Chronic Kidney Disease.        Assessment/Plan: 1. Functional deficits secondary to Left MCA distribution infarct which require 3+ hours per day of interdisciplinary therapy in a comprehensive inpatient rehab setting. Physiatrist is providing close team supervision and 24 hour management of active medical problems listed below. Physiatrist and rehab team continue to assess barriers to discharge/monitor patient progress toward functional and medical goals. FIM: FIM - Bathing Bathing Steps Patient Completed: Chest;Abdomen;Left upper leg;Left lower leg (including foot) Bathing: 2: Max-Patient completes 3-4 1f 10 parts or 25-49%  FIM - Upper Body Dressing/Undressing Upper body dressing/undressing steps patient completed: Thread/unthread left sleeve of pullover shirt/dress Upper body dressing/undressing: 2: Max-Patient completed 25-49% of tasks FIM - Lower Body Dressing/Undressing Lower body dressing/undressing: 1: Total-Patient completed less than 25% of tasks        FIM - Bed/Chair Transfer Bed/Chair Transfer: 2: Supine > Sit: Max A (lifting assist/Pt. 25-49%);2: Bed > Chair or W/C: Max A (lift and lower assist);2: Chair or  W/C > Bed: Max A (lift and lower assist);2: Sit > Supine: Max A (lifting assist/Pt. 25-49%);1: Two helpers  FIM - Locomotion: Ambulation Ambulation/Gait Assistance: Not tested (comment)  Comprehension Comprehension Mode: Auditory Comprehension: 4-Understands basic 75 - 89% of the time/requires cueing 10 - 24% of the time  Expression Expression Mode: Nonverbal Expression: 1-Expresses basis less than 25% of the time/requires cueing greater than 75% of the time.  Social Interaction Social Interaction: 2-Interacts appropriately 25 - 49% of time - Needs frequent redirection.  Problem Solving Problem Solving: 3-Solves basic 50 - 74% of the time/requires cueing 25 - 49% of the time  Memory Memory: 2-Recognizes or recalls 25 - 49% of the time/requires cueing 51 - 75% of the time  Medical Problem List and Plan:  1. 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 DVT Prophylaxis/Anticoagulation: Eliquis  3. Pain Management: Tylenol as needed  4. Neuropsych: This patient is not capable of making decisions on his/her own behalf.  5. Dysphasia. Dysphagia 2 nectar thick liquids. Monitor for any signs of aspiration. Followup speech therapy  -encourage adequate PO intake  6. PAF/CAD/ CABG. Cardiac rate controlled. Continue Eliquis as directed. Followup cardiology services as needed  7. Hyperlipidemia. Crestor  8. Glaucoma. Continue eyedrops  9. Irritable bowel syndrome.Amitiza twice a day and MiraLAX daily.  -acute service to give bowel prep before transfer?   LOS (Days) 3 A FACE TO FACE EVALUATION WAS PERFORMED  Garrie Elenes E 03/16/2013, 8:02 AM

## 2013-03-16 NOTE — Progress Notes (Signed)
Patient information reviewed and entered into eRehab system by Vannessa Godown, RN, CRRN, PPS Coordinator.  Information including medical coding and functional independence measure will be reviewed and updated through discharge.     Per nursing patient was given "Data Collection Information Summary for Patients in Inpatient Rehabilitation Facilities with attached "Privacy Act Statement-Health Care Records" upon admission.  

## 2013-03-16 NOTE — Progress Notes (Addendum)
Physical Therapy Note  Patient Details  Name: ARLIS YALE MRN: [id number] Date of Birth: 09-28-1929 Today's Date: 03/16/2013  1300-1340 (40 minutes) individual Pain: Pt reports (pointing) to Rt hip with movement/ premedicated Focus of treatment: transfer training; bed mobility training; Neuro re-ed RT LE Treatment: Pt in bed upon arrival. Wife states pt now feeling well but pt agrees to participated in session; supine to sit mod/max assist from right; transfer scoot max assist +1; sit to supine (mat) mod assist (RT LE /trunk); sidelying (antigravity) RT hip flexion AA with initiation of minimal active movement; sitting edge of bed mod assist ; sitting edge of mat (firm surface) min assist to close SBA .Pt tolerated session without complaints and returned to room in wc with safety belt in place.    Elye Harmsen,JIM 03/16/2013, 2:14 PM

## 2013-03-16 NOTE — Care Management Note (Signed)
[facility]patient Rehabilitation Center Individual Statement of Services  Patient Name:  Frank Weeks  Date:  03/16/2013  Welcome to the Inpatient Rehabilitation Center.  Our goal is to provide you with an individualized program based on your diagnosis and situation, designed to meet your specific needs.  With this comprehensive rehabilitation program, you will be expected to participate in at least 3 hours of rehabilitation therapies Monday-Friday, with modified therapy programming on the weekends.  Your rehabilitation program will include the following services:  Physical Therapy (PT), Occupational Therapy (OT), Speech Therapy (ST), 24 hour per day rehabilitation nursing, Neuropsychology, Case Management ( Social Worker), Rehabilitation Medicine, Nutrition Services and Pharmacy Services  Weekly team conferences will be held on Wednesday to discuss your progress.  Your Social Worker will talk with you frequently to get your input and to update you on team discussions.  Team conferences with you and your family in attendance may also be held.  Expected length of stay: 3-4 weeks Overall anticipated outcome: min level  Depending on your progress and recovery, your program may change. Your Social Worker will coordinate services and will keep you informed of any changes. Your Child psychotherapist names and contact numbers are listed  below.  The following services may also be recommended but are not provided by the Inpatient Rehabilitation Center:   Driving Evaluations  Home Health Rehabiltiation Services  Outpatient Rehabilitatation Servives   Arrangements will be made to provide these services after discharge if needed.  Arrangements include referral to agencies that provide these services.  Your insurance has been verified to be:  [employer] Your primary doctor is:  Dr Rodrigo Ran  Pertinent information will be shared with your doctor and your insurance company.  Social Worker:  Dossie Der, [place]  [phone]  Information discussed with and copy given to patient by: [name], 03/16/2013, 8:46 AM

## 2013-03-17 ENCOUNTER — Inpatient Hospital Stay (HOSPITAL_COMMUNITY): Payer: Medicare Other

## 2013-03-17 ENCOUNTER — Inpatient Hospital Stay (HOSPITAL_COMMUNITY): Payer: Medicare Other | Admitting: Physical Therapy

## 2013-03-17 ENCOUNTER — Inpatient Hospital Stay (HOSPITAL_COMMUNITY): Payer: Medicare Other | Admitting: Speech Pathology

## 2013-03-17 ENCOUNTER — Ambulatory Visit (HOSPITAL_COMMUNITY): Payer: Medicare Other | Admitting: Physical Therapy

## 2013-03-17 DIAGNOSIS — I4891 Unspecified atrial fibrillation: Secondary | ICD-10-CM

## 2013-03-17 DIAGNOSIS — I6340 Cerebral infarction due to embolism of unspecified cerebral artery: Secondary | ICD-10-CM

## 2013-03-17 DIAGNOSIS — G8110 Spastic hemiplegia affecting unspecified side: Secondary | ICD-10-CM

## 2013-03-17 NOTE — Progress Notes (Signed)
Occupational Therapy Session Note  Patient Details  Name: Frank Weeks MRN: [id number] Date of Birth: 1929-06-12  Today's Date: 03/17/2013 Time: 1030-1128 Time Calculation (min): 58 min  Short Term Goals: Week 1:  OT Short Term Goal 1 (Week 1): Pt will sit EOB for 2 mins with supervision during self-care task  OT Short Term Goal 2 (Week 1): Pt will complete UB bathing with min assist and less than 25% cues OT Short Term Goal 3 (Week 1): Pt will complete UB dressing with min assist OT Short Term Goal 4 (Week 1): Pt will complete LB bathing with mod assist OT Short Term Goal 5 (Week 1): Pt will complete squat pivot transfer to [facility] with max assist of 1 person  Skilled Therapeutic Interventions/Progress Updates:    1) Pt seen for ADL retraining with focus on squat pivot transfers, sit <> stand, standing balance, and hemi-technique for bathing and dressing.  Pt in bed upon arrival and wife reports he is tired, however pt willing to get OOB.  Engaged in bathing and dressing at sit <> stand level at sink with +2 for standing with hygiene and pulling up pants.  +2 for safeyt and assistance with squat pivot secondary to pt's inability to completely pivot when transferring to his Rt.  Pt with decreased trunk control and hesitancy to reach to wash feet.  Sit <> stand max-mod assist with focus on increased weight shifting and hand placement for sit <> stand.  Tactile cues and manual facilitation at hips to increase upright standing posture with use of mirror for visual cues.  Pt reports fatigue post session and requested to return to bed.  Pt and wife reporting therapy sessions being taxing on pt, discussed 15/7 with pt and wife as an option to allow pt increased rest breaks.  Therapy Documentation Precautions:  Precautions Precautions: Fall Precaution Comments: expressive aphasia Restrictions Weight Bearing Restrictions: No Pain: Pain Assessment Pain Assessment: No/denies pain  See FIM for current  functional status  Therapy/Group: Individual Therapy  Leonette Monarch 03/17/2013, 1:25 PM

## 2013-03-17 NOTE — Progress Notes (Signed)
Patient ID: Frank Weeks, male   DOB: [birth year], 79 y.o.   MRN: [id number] Subjective/Complaints: 79 y.o. right-handed male with history of CAD/CABG, PAF pacemaker, subarachnoid hemorrhage 50 years ago CVA. Admitted 03/08/2013 with fall reported by wife with right-sided weakness and slurred speech. Cranial CT showed developing low density left basal ganglia external capsule region consistent with acute infarct. Carotid Dopplers with no ICA stenosis. Echocardiogram with ejection fraction of 60% and normal systolic function. CT angiogram of the head showed a filling defect throughout the left M1 segment compatible with embolus causing acute infarct in left basal ganglia.  Aphasic   Review of Systems  Unable to perform ROS: medical condition   Objective: Vital Signs: Blood pressure 108/69, pulse 70, temperature 97.8 F (36.6 C), temperature source Oral, resp. rate 18, height 5\' 8"  (1.727 m), weight 78.1 kg (172 lb 2.9 oz), SpO2 97.00%. No results found. Results for orders placed during the hospital encounter of 03/13/13 (from the past 72 hour(s))  CBC WITH DIFFERENTIAL     Status: Abnormal   Collection Time    03/16/13  5:27 AM      Result Value Range   WBC 9.2  4.0 - 10.5 K/uL   RBC 4.58  4.22 - 5.81 MIL/uL   Hemoglobin 14.8  13.0 - 17.0 g/dL   HCT 14.7  82.9 - 56.2 %   MCV 94.8  78.0 - 100.0 fL   MCH 32.3  26.0 - 34.0 pg   MCHC 34.1  30.0 - 36.0 g/dL   RDW 13.0  86.5 - 78.4 %   Platelets 244  150 - 400 K/uL   Neutrophils Relative % 75  43 - 77 %   Neutro Abs 6.9  1.7 - 7.7 K/uL   Lymphocytes Relative 8 (*) 12 - 46 %   Lymphs Abs 0.8  0.7 - 4.0 K/uL   Monocytes Relative 14 (*) 3 - 12 %   Monocytes Absolute 1.3 (*) 0.1 - 1.0 K/uL   Eosinophils Relative 2  0 - 5 %   Eosinophils Absolute 0.2  0.0 - 0.7 K/uL   Basophils Relative 1  0 - 1 %   Basophils Absolute 0.1  0.0 - 0.1 K/uL  COMPREHENSIVE METABOLIC PANEL     Status: Abnormal   Collection Time    03/16/13  5:27 AM      Result  Value Range   Sodium 133 (*) 135 - 145 mEq/L   Potassium 4.6  3.5 - 5.1 mEq/L   Chloride 99  96 - 112 mEq/L   CO2 22  19 - 32 mEq/L   Glucose, Bld 106 (*) 70 - 99 mg/dL   BUN 23  6 - 23 mg/dL   Creatinine, Ser 6.96  0.50 - 1.35 mg/dL   Calcium 9.2  8.4 - 29.5 mg/dL   Total Protein 7.8  6.0 - 8.3 g/dL   Albumin 2.7 (*) 3.5 - 5.2 g/dL   AST 19  0 - 37 U/L   ALT 18  0 - 53 U/L   Alkaline Phosphatase 103  39 - 117 U/L   Total Bilirubin 0.4  0.3 - 1.2 mg/dL   GFR calc non Af Amer 77 (*) >90 mL/min   GFR calc Af Amer 89 (*) >90 mL/min   Comment:            The eGFR has been calculated     using the CKD EPI equation.     This calculation has not been  validated in all clinical     situations.     eGFR's persistently     <90 mL/min signify     possible Chronic Kidney Disease.        Assessment/Plan: 1. Functional deficits secondary to Left MCA distribution infarct which require 3+ hours per day of interdisciplinary therapy in a comprehensive inpatient rehab setting. Physiatrist is providing close team supervision and 24 hour management of active medical problems listed below. Physiatrist and rehab team continue to assess barriers to discharge/monitor patient progress toward functional and medical goals. FIM: FIM - Bathing Bathing Steps Patient Completed: Chest;Abdomen;Left upper leg;Left lower leg (including foot) Bathing: 2: Max-Patient completes 3-4 23f 10 parts or 25-49%  FIM - Upper Body Dressing/Undressing Upper body dressing/undressing steps patient completed: Thread/unthread left sleeve of pullover shirt/dress Upper body dressing/undressing: 2: Max-Patient completed 25-49% of tasks FIM - Lower Body Dressing/Undressing Lower body dressing/undressing: 1: Two helpers  FIM - Toileting Toileting: 1: Two helpers  FIM - Diplomatic Services operational officer Devices: Psychiatrist Transfers: 2-To toilet/BSC: Max A (lift and lower assist);2-From toilet/BSC:  Max A (lift and lower assist);1-Two helpers  FIM - Banker Devices: Bed rails;Arm rests Bed/Chair Transfer: 2: Supine > Sit: Max A (lifting assist/Pt. 25-49%);3: Sit > Supine: Mod A (lifting assist/Pt. 50-74%/lift 2 legs);2: Bed > Chair or W/C: Max A (lift and lower assist);2: Chair or W/C > Bed: Max A (lift and lower assist)  FIM - Locomotion: Wheelchair Distance: 150 Locomotion: Wheelchair: 1: Total Assistance/staff pushes wheelchair (Pt<25%) FIM - Locomotion: Ambulation Ambulation/Gait Assistance: Not tested (comment) Locomotion: Ambulation: 0: Activity did not occur  Comprehension Comprehension Mode: Auditory Comprehension: 3-Understands basic 50 - 74% of the time/requires cueing 25 - 50%  of the time  Expression Expression Mode: Nonverbal Expression: 1-Expresses basis less than 25% of the time/requires cueing greater than 75% of the time.  Social Interaction Social Interaction: 2-Interacts appropriately 25 - 49% of time - Needs frequent redirection.  Problem Solving Problem Solving: 3-Solves basic 50 - 74% of the time/requires cueing 25 - 49% of the time  Memory Memory: 3-Recognizes or recalls 50 - 74% of the time/requires cueing 25 - 49% of the time  Medical Problem List and Plan:  1. 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 DVT Prophylaxis/Anticoagulation: Eliquis  3. Pain Management: Tylenol as needed  4. Neuropsych: This patient is not capable of making decisions on his/her own behalf.  5. Dysphasia. Dysphagia 2 nectar thick liquids. Monitor for any signs of aspiration. Followup speech therapy  -encourage adequate PO intake  6. PAF/CAD/ CABG. Cardiac rate controlled. Continue Eliquis as directed. Followup cardiology services as needed  7. Hyperlipidemia. Crestor  8. Glaucoma. Continue eyedrops  9. Irritable bowel syndrome.Amitiza twice a day and MiraLAX daily.  -acute service to give bowel prep before  transfer?   LOS (Days) 4 A FACE TO FACE EVALUATION WAS PERFORMED  FrankANDREW Weeks 03/17/2013, 7:05 AM

## 2013-03-17 NOTE — Progress Notes (Signed)
Physical Therapy Note  Patient Details  Name: Frank Weeks MRN: [id number] Date of Birth: Apr 21, 1929 Today's Date: 03/17/2013  Patient refused PT/OT cotreat and 45 min individual PT session in pm; allowed patient to rest another 45 minutes while PT and OT treated another patient; upon returning patient refused again stating he was very fatigued.  Pt reports that he isn't sleeping well at night.  Pt also noted to be refusing meals.  Educated patient on importance of nourishment for healing and energy to participate in therapies.  Offered to assist patient with feeding.  Patient continued to refuse.  Pt taken down to 15/7 to improve patient tolerance to therapy.    [name] Faucette 03/17/2013, 2:40 PM

## 2013-03-17 NOTE — Progress Notes (Signed)
Speech Language Pathology Daily Session Note  Patient Details  Name: Frank Weeks MRN: [id number] Date of Birth: May 28, 1929  Today's Date: 03/17/2013 Time: 0915-1000 Time Calculation (min): 45 min  Short Term Goals: Week 1: SLP Short Term Goal 1 (Week 1): Patient will perform oral motor exercises with min A multimodal cueing to improve labial/lingual strength. (New goal) SLP Short Term Goal 2 (Week 1): Patient will perform pharyngeal strengthening exercises with min A multimodal cueing to decrease aspiration risk.  SLP Short Term Goal 3 (Week 1): Patient will answer yes/no questions with 90% accuracy.  SLP Short Term Goal 4 (Week 1): Patient will follow one-step commands with 90% accuracy.  SLP Short Term Goal 5 (Week 1): Patient will use compensatory strategies to increase speech intelligibility with min A.   Skilled Therapeutic Interventions: Skilled therapy session focused on addressing dysphagia and speech goals.  SLP facilitated session with trials of thin liquids via cup with immediate weak cough response.  SLP educated patient regarding suspected aspiration and recommendation to continue with nectar-thick liquids.  SLP also facilitated session by addressing patient's verbal apraxia and substitutions and omissions in the initial phoneme position; patient required Max assist verbal and visual cues to produce initial sounds /m, b,p,h,n/ in CVC words.  Of note, patient unable to produce /k,g,t,d/ with clinician cues.       FIM:  Comprehension Comprehension: 3-Understands basic 50 - 74% of the time/requires cueing 25 - 50%  of the time Expression Expression: 1-Expresses basis less than 25% of the time/requires cueing greater than 75% of the time. Social Interaction Social Interaction: 4-Interacts appropriately 75 - 89% of the time - Needs redirection for appropriate language or to initiate interaction. Problem Solving Problem Solving: 3-Solves basic 50 - 74% of the time/requires cueing 25 -  49% of the time Memory Memory: 3-Recognizes or recalls 50 - 74% of the time/requires cueing 25 - 49% of the time  Pain Pain Assessment Pain Assessment: No/denies pain  Therapy/Group: Individual Therapy  Charlane Ferretti., CCC-SLP [phone]  Valori Hollenkamp 03/17/2013, 1:20 PM

## 2013-03-17 NOTE — Progress Notes (Signed)
Social Work Assessment and Plan Social Work Assessment and Plan  Patient Details  Name: Frank Weeks MRN: [id number] Date of Birth: Jun 14, 1929  Today's Date: 03/17/2013  Problem List:  Patient Active Problem List   Diagnosis Date Noted  . Embolic cerebral infarction 03/16/2013  . Atrial flutter 03/11/2013  . Hemiparesis 03/08/2013  . Falls 06/17/2012  . Fatigue 06/12/2011  . Chest pain, non-cardiac   . Pacemaker-St.Jude   . DIASTOLIC HEART FAILURE, ACUTE 08/65/7846  . Atrial fibrillation 11/04/2009  . DYSLIPIDEMIA 07/08/2009  . GLAUCOMA 07/08/2009  . HYPERTENSION, UNSPECIFIED 07/08/2009  . CAD, ARTERY BYPASS GRAFT 07/08/2009  . Subarachnoid hemorrhage 07/08/2009  . CEREBROVASCULAR DISEASE 07/08/2009  . PERIPHERAL VASCULAR DISEASE 07/08/2009  . PLEURAL EFFUSION 07/08/2009  . GASTROESOPHAGEAL REFLUX DISEASE 07/08/2009  . 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 SHORTNESS OF BREATH 03/03/2009   Past Medical History:  Past Medical History  Diagnosis Date  . Coronary artery disease     a. 2007 CABGx3: LIMA->LAD, VG->RI, VG->RCA;  b. 2010 Aden MV: nonischemic.  . Bradycardia     a. s/p PPM.  . PVD (peripheral vascular disease)   . HTN (hypertension)   . Dyslipidemia   . Subarachnoid hemorrhage     a. aneurysmal 1964, s/p clipping.  . Cerebrovascular disease     a. s/p R CEA;  b. 02/2013 Carotid U/S: no signif ICA stenosis.  Marland Kitchen GERD (gastroesophageal reflux disease)   . Stroke     a. first @ age 50->memory deficits;  b. 02/2013 embolic stroke - left basal ganglia.  . Glaucoma   . Pleural effusion   . PAF (paroxysmal atrial fibrillation)     a. previously refused coumadin and felt to be poor coumadin candidate 2/2 falls/unsteady gait;  b. 02/2013 Echo: EF 55-60%, mild to mod AS.   Past Surgical History:  Past Surgical History  Procedure Laterality Date  . Coronary artery bypass graft  03/18/2006  . Right carotid endarectomy  09/2004  . Left carotid to vertebral bypass     . Dual-chamber pacemaker implantation    . Thoracentesis and bronchoscopy     Social History:  reports that he quit smoking about 62 years ago. He does not have any smokeless tobacco history on file. He reports that he does not drink alcohol. His drug history is not on file.  Family / Support Systems Marital Status: Married Patient Roles: Spouse;Parent Spouse/Significant Other: Dorothy-" Dot" [phone]  [phone] Children: Tom-son  [phone]-cell Other Supports: daughter out of town Anticipated Caregiver: Wife Ability/Limitations of Caregiver: Wife has physical limitations-only able to provide supervision-min level, wants to take pt home but be able to manage him also Caregiver Availability: 24/7 Family Dynamics: Close knit family children who are out of town but invovled and supportive.  Wife is grateful to have them both and they visit often.  Social History Preferred language: English Religion: Methodist Cultural Background: No issues Education: Some college courses Read: Yes Write: Yes Employment Status: Retired Fish farm manager Issues: No issues Guardian/Conservator: None-according to MD pt is not capable of making his own decisions.  Will therefore rely upon wife to make his decisions while here.   Abuse/Neglect Physical Abuse: Denies Verbal Abuse: Denies Sexual Abuse: Denies Exploitation of patient/patient's resources: Denies Self-Neglect: Denies  Emotional Status Pt's affect, behavior adn adjustment status: Pt looks at you when talking and is participating in therapies according to team.  Wife reports he has always been a hard worker and one to do for himself.  Pt  did not speak while this worker was in his room, but tended to let wife speak for him.  Wonderw ith his decrease in appetite if he is becoming depressed.  Will have nuero-psych see while here. Recent Psychosocial Issues: Other medical issues-had managed them PTA Pyschiatric History: No  issues-deferred depression screen due to pt's aphasia and will monitor while here.  Will have neuro-psych see due to may becoming depressed due to stroke and deficts and awareness of them. Substance Abuse History: No issues-quit smoking in the 50's  Patient / Family Perceptions, Expectations & Goals Pt/Family understanding of illness & functional limitations: Pt has a basic understanding but defers to wife.  Wife is here daily and talks with MD regarding pt's medical issues and treatment plan.  She has observed in therapies and is encouraging pt to try and work.  She is a strong support of pt. Premorbid pt/family roles/activities: Husband, Father, grandfather, retiree, Home owner, Church member, etc Anticipated changes in roles/activities/participation: resume Pt/family expectations/goals: Wife states: ' I hope he can walk and talk before he leaves here, thait is our main goal."  She will do what she can for him, but needs him to be mobile.  Community Resources [employer]: None Premorbid Home Care/DME Agencies: None Transportation available at discharge: Wife drives Resource referrals recommended: Support group (specify) ([facility] Support group)  Discharge Planning Living Arrangements: Spouse/significant other Support Systems: Spouse/significant other;Children;Friends/neighbors;Church/faith community;Other relatives Type of Residence: Private residence Insurance Resources: Media planner (specify) Passenger transport manager) Financial Resources: Restaurant manager, fast food Screen Referred: No Living Expenses: Lives with family Money Management: Spouse;Patient Do you have any problems obtaining your medications?: No Home Management: Wife Patient/Family Preliminary Plans: Return home with wife if he can [facility] enough progress she can handle him.  She is aware of the alternatives-hired assist versus NHP.  Pt may need to go to a NH from here to continue to recieve therpaies and then return home and  hopefully a higher level.  Will cross that bridge when we come to it. Social Work Anticipated Follow Up Needs: HH/OP;Support Group;SNF  Clinical Impression Pleasant gentleman who is willing to work in therpapes and a very supportive wife who will do all she can for him.  Pt needs to get to a supervision/min level to return home with wife. Aware of the options, but will see how pt progresses in therapies.  Work with pt, wife, insurance on a safe discharge plan.  Pt would benefit from Neuro-psych eval get order for.   [name] 03/17/2013, 12:25 PM

## 2013-03-18 ENCOUNTER — Inpatient Hospital Stay (HOSPITAL_COMMUNITY): Payer: Medicare Other | Admitting: Speech Pathology

## 2013-03-18 ENCOUNTER — Inpatient Hospital Stay (HOSPITAL_COMMUNITY): Payer: Medicare Other | Admitting: Occupational Therapy

## 2013-03-18 ENCOUNTER — Inpatient Hospital Stay (HOSPITAL_COMMUNITY): Payer: Medicare Other | Admitting: Physical Therapy

## 2013-03-18 LAB — URINALYSIS, ROUTINE W REFLEX MICROSCOPIC
Glucose, UA: NEGATIVE mg/dL
Ketones, ur: NEGATIVE mg/dL
Protein, ur: NEGATIVE mg/dL

## 2013-03-18 LAB — URINE MICROSCOPIC-ADD ON

## 2013-03-18 MED ORDER — FLEET ENEMA 7-19 GM/118ML RE ENEM: 1.0000 | ENEMA | Freq: Every day | RECTAL | Status: DC | PRN

## 2013-03-18 MED ORDER — METHYLPHENIDATE HCL 5 MG PO TABS
5.0000 mg | ORAL_TABLET | Freq: Two times a day (BID) | ORAL | Status: DC
  Administered 2013-03-18 – 2013-03-21 (×6): 5 mg via ORAL
  Filled 2013-03-18 (×6): qty 1

## 2013-03-18 MED ORDER — CIPROFLOXACIN HCL 250 MG PO TABS
250.0000 mg | ORAL_TABLET | Freq: Two times a day (BID) | ORAL | Status: DC
  Administered 2013-03-18 – 2013-03-21 (×6): 250 mg via ORAL
  Filled 2013-03-18 (×9): qty 1

## 2013-03-18 MED ORDER — NYSTATIN 100000 UNIT/ML MT SUSP
5.0000 mL | Freq: Four times a day (QID) | OROMUCOSAL | Status: DC
  Administered 2013-03-18 – 2013-04-06 (×72): 500000 [IU] via ORAL
  Filled 2013-03-18 (×80): qty 5

## 2013-03-18 MED ORDER — BISACODYL 10 MG RE SUPP
10.0000 mg | Freq: Every day | RECTAL | Status: DC | PRN
  Administered 2013-03-24: 10 mg via RECTAL
  Filled 2013-03-18 (×4): qty 1

## 2013-03-18 NOTE — Progress Notes (Signed)
Speech Language Pathology Daily Session Note  Patient Details  Name: Frank Weeks MRN: [id number] Date of Birth: 11/10/1928  Today's Date: 03/18/2013 Time: 4098-1191 Time Calculation (min): 40 min  Short Term Goals: Week 1: SLP Short Term Goal 1 (Week 1): Patient will perform oral motor exercises with min A multimodal cueing to improve labial/lingual strength. (New goal) SLP Short Term Goal 2 (Week 1): Patient will perform pharyngeal strengthening exercises with min A multimodal cueing to decrease aspiration risk.  SLP Short Term Goal 3 (Week 1): Patient will answer yes/no questions with 90% accuracy.  SLP Short Term Goal 4 (Week 1): Patient will follow one-step commands with 90% accuracy.  SLP Short Term Goal 5 (Week 1): Patient will use compensatory strategies to increase speech intelligibility with min A.  SLP Short Term Goal 6 (Week 1): Patient will sustain attention to basic, familiar tasks for 5 minutes with Supervision level verbal cues.  Skilled Therapeutic Interventions: Skilled therapy session focused on addressing dysphagia goals.  SLP facilitated session with breakfast of Dys.2 textures and Nectar-thick liquids; patient required Supervision level verbal cues for basic set-up and problem solving as well as Min increased to Mod assist cues to sustain attention and self-monitor and correct left-sided pocketing.  Patient exhibited no overt s/s of aspiration during meal; however, impaired sustainted attention, awareness, problem solving and recall were observed and goals will be addressing these deficits.     FIM:  Comprehension Comprehension: 3-Understands basic 50 - 74% of the time/requires cueing 25 - 50%  of the time Expression Expression: 1-Expresses basis less than 25% of the time/requires cueing greater than 75% of the time. Social Interaction Social Interaction: 4-Interacts appropriately 75 - 89% of the time - Needs redirection for appropriate language or to initiate  interaction. Problem Solving Problem Solving: 3-Solves basic 50 - 74% of the time/requires cueing 25 - 49% of the time Memory Memory: 3-Recognizes or recalls 50 - 74% of the time/requires cueing 25 - 49% of the time FIM - Eating Eating Activity: 4: Helper checks for pocketed food  Pain Pain Assessment Pain Assessment: No/denies pain  Therapy/Group: Individual Therapy  Charlane Ferretti., CCC-SLP [phone]  [name] 03/18/2013, 9:37 AM

## 2013-03-18 NOTE — Patient Care Conference (Signed)
Inpatient RehabilitationTeam Conference and Plan of Care Update Date: 03/18/2013   Time: 11:30 Am    Patient Name: Frank Weeks      Medical Record Number: [id number]  Date of Birth: [birth year] Sex: Male         Room/Bed: 4034/4034-01 Payor Info: Payor: BLUE [employer]OSS [employer]UE [employer] [place] MEDICARE / Plan: BLUE MEDICARE / Product Type: *No Product type* /    Admitting Diagnosis: CVA  Admit Date/Time:  03/13/2013  4:09 PM Admission Comments: No comment available   Primary Diagnosis:  Embolic cerebral infarction Principal Problem: Embolic cerebral infarction  Patient Active Problem List   Diagnosis Date Noted  . Embolic cerebral infarction 03/16/2013  . Atrial flutter 03/11/2013  . Hemiparesis 03/08/2013  . Falls 06/17/2012  . Fatigue 06/12/2011  . Chest pain, non-cardiac   . Pacemaker-St.Jude   . DIASTOLIC HEART FAILURE, ACUTE 46/96/2952  . Atrial fibrillation 11/04/2009  . DYSLIPIDEMIA 07/08/2009  . GLAUCOMA 07/08/2009  . HYPERTENSION, UNSPECIFIED 07/08/2009  . CAD, ARTERY BYPASS GRAFT 07/08/2009  . Subarachnoid hemorrhage 07/08/2009  . CEREBROVASCULAR DISEASE 07/08/2009  . PERIPHERAL VASCULAR DISEASE 07/08/2009  . PLEURAL EFFUSION 07/08/2009  . GASTROESOPHAGEAL REFLUX DISEASE 07/08/2009  . 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 SHORTNESS OF BREATH 03/03/2009    Expected Discharge Date:    Team Members Present: Physician leading conference: Dr. Claudette Laws Social Worker Present: Dossie Der, LCSW Nurse Present: Gregor Hams, RN PT Present: [name], PT;Caroline Adriana Simas, PT;Other (comment) Clarisse Gouge Ripa-PT) OT Present: Other (comment);Leonette Monarch, Felipa Eth, OT (Kayla Parkinson-OT) SLP Present: Fae Pippin, SLP Other (Discipline and Name): Charolette Child Coordinator     Current Status/Progress Goal Weekly Team Focus  Medical   Remains a phasic. Still with significant right hemiparesis. Foley is out.  Voiding without evidence of retention  Monitor  urine output as well as PVRs   Bowel/Bladder   incontinent of bowel and bladder, on Amitiza BID, offer toileting every 3 hours and prn, depends in use, pt does not call for assistance  continent with toileting      Swallow/Nutrition/ Hydration   Dys.2 tetures and Nectar-thick liquids  least restrictive p.o. intake  increase use of compensatory strategies   ADL's   mod assist bathing, mod assist UB dressing, total +2 LB dressing, max-+2 transfers, +2 for toileting hygiene and clothing management  min assist overall  trunk control, transfers, OOB activity tolerance   Mobility   max-total A  Min A overall w/c level  Transfers, sitting balance and trunk control   Communication   Max assist  Min assist  increase production of initial phonemes   Safety/Cognition/ Behavioral Observations  bed alarm, side rails up x 3, quick release belt in chair  no falls with injury least restrictive device      Pain   no complaint of pain         Skin   bruises to arms and abdomen, rash of flat red areas noted to lower abdomen, scrotom, shaft of penis, scrotom pink  no new breakdown         *See Care Plan and progress notes for long and short-term goals.  Barriers to Discharge: Physical assistance level, bladder dysfunction    Possible Resolutions to Barriers:  Continue rehabilitation, see above    Discharge Planning/Teaching Needs:  Wife would like to take pt home, dependent upon level of assistance needed.  She is here and observes in therapies.  Aware of alternatives.  Neuro-psych to eval pt-possible depression  Team Discussion:  Working on [place]in.  Checking for UTI and thrush.  Appetite poor since Sat.  Ques if becoming depressed-neuro-psych to see Thurs.  Endurance poor.  Wife here daily to provide support  Revisions to Treatment Plan:  Made 15/7   Continued Need for Acute Rehabilitation Level of Care: The patient requires daily medical management by a physician with  specialized training in physical medicine and rehabilitation for the following conditions: Daily direction of a multidisciplinary physical rehabilitation program to ensure safe treatment while eliciting the highest outcome that is of practical value to the patient.: Yes Daily medical management of patient stability for increased activity during participation in an intensive rehabilitation regime.: Yes Daily analysis of laboratory values and/or radiology reports with any subsequent need for medication adjustment of medical intervention for : Other;Neurological problems  Frank Weeks 03/19/2013, 8:54 AM

## 2013-03-18 NOTE — Plan of Care (Signed)
Problem: RH BOWEL ELIMINATION Goal: RH STG MANAGE BOWEL WITH ASSISTANCE STG Manage Bowel with mod Assistance.  Outcome: Not Progressing Incontinent per report,

## 2013-03-18 NOTE — Progress Notes (Signed)
Noted on assessment  patient's lower abdomen, scrotum, shaft of penis with scattered flat, red areas. Patient denies any pain, itching, no drainage noted from areas,per report of Nurse who cared for patient on 03/17/13 these were not there on 03/17/13. Paged Deatra Ina PA for patient, made aware of above, no new orders given at this time. Roberts-VonC[place], Frank Weeks

## 2013-03-18 NOTE — Plan of Care (Signed)
Problem: RH BLADDER ELIMINATION Goal: RH STG MANAGE BLADDER WITH ASSISTANCE STG Manage Bladder With mod Assistance  Outcome: Not Progressing Incontinent, depends in use, begin offer toileting every 3 hours and prn, pt does not call for toileting, assess needs on rounds as well

## 2013-03-18 NOTE — Progress Notes (Signed)
Patient ID: Frank Weeks, male   DOB: December 21, 1928, 79 y.o.   MRN: [id number] Subjective/Complaints: 79 y.o. right-handed male with history of CAD/CABG, PAF pacemaker, subarachnoid hemorrhage 50 years ago CVA. Admitted 03/08/2013 with fall reported by wife with right-sided weakness and slurred speech. Cranial CT showed developing low density left basal ganglia external capsule region consistent with acute infarct. Carotid Dopplers with no ICA stenosis. Echocardiogram with ejection fraction of 60% and normal systolic function. CT angiogram of the head showed a filling defect throughout the left M1 segment compatible with embolus causing acute infarct in left basal ganglia.  Aphasic Foley is out. Nursing notes dark foul-smelling urine    Review of Systems  Unable to perform ROS: medical condition   Objective: Vital Signs: Blood pressure 114/74, pulse 70, temperature 97.3 F (36.3 C), temperature source Oral, resp. rate 20, height 5\' 8"  (1.727 m), weight 78.1 kg (172 lb 2.9 oz), SpO2 97.00%. No results found. Results for orders placed during the hospital encounter of 03/13/13 (from the past 72 hour(s))  CBC WITH DIFFERENTIAL     Status: Abnormal   Collection Time    03/16/13  5:27 AM      Result Value Range   WBC 9.2  4.0 - 10.5 K/uL   RBC 4.58  4.22 - 5.81 MIL/uL   Hemoglobin 14.8  13.0 - 17.0 g/dL   HCT 78.2  95.6 - 21.3 %   MCV 94.8  78.0 - 100.0 fL   MCH 32.3  26.0 - 34.0 pg   MCHC 34.1  30.0 - 36.0 g/dL   RDW 08.6  57.8 - 46.9 %   Platelets 244  150 - 400 K/uL   Neutrophils Relative % 75  43 - 77 %   Neutro Abs 6.9  1.7 - 7.7 K/uL   Lymphocytes Relative 8 (*) 12 - 46 %   Lymphs Abs 0.8  0.7 - 4.0 K/uL   Monocytes Relative 14 (*) 3 - 12 %   Monocytes Absolute 1.3 (*) 0.1 - 1.0 K/uL   Eosinophils Relative 2  0 - 5 %   Eosinophils Absolute 0.2  0.0 - 0.7 K/uL   Basophils Relative 1  0 - 1 %   Basophils Absolute 0.1  0.0 - 0.1 K/uL  COMPREHENSIVE METABOLIC PANEL     Status: Abnormal    Collection Time    03/16/13  5:27 AM      Result Value Range   Sodium 133 (*) 135 - 145 mEq/L   Potassium 4.6  3.5 - 5.1 mEq/L   Chloride 99  96 - 112 mEq/L   CO2 22  19 - 32 mEq/L   Glucose, Bld 106 (*) 70 - 99 mg/dL   BUN 23  6 - 23 mg/dL   Creatinine, Ser 6.29  0.50 - 1.35 mg/dL   Calcium 9.2  8.4 - 52.8 mg/dL   Total Protein 7.8  6.0 - 8.3 g/dL   Albumin 2.7 (*) 3.5 - 5.2 g/dL   AST 19  0 - 37 U/L   ALT 18  0 - 53 U/L   Alkaline Phosphatase 103  39 - 117 U/L   Total Bilirubin 0.4  0.3 - 1.2 mg/dL   GFR calc non Af Amer 77 (*) >90 mL/min   GFR calc Af Amer 89 (*) >90 mL/min   Comment:            The eGFR has been calculated     using the CKD EPI equation.  This calculation has not been     validated in all clinical     situations.     eGFR's persistently     <90 mL/min signify     possible Chronic Kidney Disease.        Assessment/Plan: 1. Functional deficits secondary to Left MCA distribution infarct which require 3+ hours per day of interdisciplinary therapy in a comprehensive inpatient rehab setting. Physiatrist is providing close team supervision and 24 hour management of active medical problems listed below. Physiatrist and rehab team continue to assess barriers to discharge/monitor patient progress toward functional and medical goals.  Team conference today  FIM: FIM - Bathing Bathing Steps Patient Completed: Chest;Left Arm;Abdomen;Buttocks;Right upper leg;Left upper leg Bathing: 3: Mod-Patient completes 5-7 46f 10 parts or 50-74%  FIM - Upper Body Dressing/Undressing Upper body dressing/undressing steps patient completed: Thread/unthread left sleeve of pullover shirt/dress;Put head through opening of pull over shirt/dress Upper body dressing/undressing: 3: Mod-Patient completed 50-74% of tasks FIM - Lower Body Dressing/Undressing Lower body dressing/undressing steps patient completed: Thread/unthread left pants leg Lower body dressing/undressing: 1: Two  helpers  FIM - Toileting Toileting: 1: Two helpers  FIM - Diplomatic Services operational officer Devices: Psychiatrist Transfers: 2-To toilet/BSC: Max A (lift and lower assist);2-From toilet/BSC: Max A (lift and lower assist);1-Two helpers  FIM - Banker Devices: Bed rails;Arm rests Bed/Chair Transfer: 2: Supine > Sit: Max A (lifting assist/Pt. 25-49%);2: Bed > Chair or W/C: Max A (lift and lower assist);3: Sit > Supine: Mod A (lifting assist/Pt. 50-74%/lift 2 legs);2: Chair or W/C > Bed: Max A (lift and lower assist);1: Two helpers  FIM - Locomotion: Wheelchair Distance: 150 Locomotion: Wheelchair: 1: Total Assistance/staff pushes wheelchair (Pt<25%) FIM - Locomotion: Ambulation Ambulation/Gait Assistance: Not tested (comment) Locomotion: Ambulation: 0: Activity did not occur  Comprehension Comprehension Mode: Auditory Comprehension: 3-Understands basic 50 - 74% of the time/requires cueing 25 - 50%  of the time  Expression Expression Mode: Verbal Expression: 1-Expresses basis less than 25% of the time/requires cueing greater than 75% of the time.  Social Interaction Social Interaction: 4-Interacts appropriately 75 - 89% of the time - Needs redirection for appropriate language or to initiate interaction.  Problem Solving Problem Solving: 3-Solves basic 50 - 74% of the time/requires cueing 25 - 49% of the time  Memory Memory: 3-Recognizes or recalls 50 - 74% of the time/requires cueing 25 - 49% of the time  Medical Problem List and Plan:  1. 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 DVT Prophylaxis/Anticoagulation: Eliquis  3. Pain Management: Tylenol as needed  4. Neuropsych: This patient is not capable of making decisions on his/her own behalf.  5. Dysphasia. Dysphagia 2 nectar thick liquids. Monitor for any signs of aspiration. Followup speech therapy  -encourage adequate PO intake  6.  PAF/CAD/ CABG. Cardiac rate controlled. Continue Eliquis as directed. Followup cardiology services as needed  7. Hyperlipidemia. Crestor  8. Glaucoma. Continue eyedrops  9. Irritable bowel syndrome.Amitiza twice a day and MiraLAX daily.  -acute service to give bowel prep before transfer? 10. Rule out UTI check urinalysis   LOS (Days) 5 A FACE TO FACE EVALUATION WAS PERFORMED  Llewellyn Choplin E 03/18/2013, 8:26 AM

## 2013-03-18 NOTE — Progress Notes (Signed)
Physical Therapy Session Note  Patient Details  Name: Frank Weeks MRN: [id number] Date of Birth: 04/01/1929  Today's Date: 03/18/2013 Time: 1305 (full time 1305-1355)-1325 Time Calculation (min): 20 min (50 min cotreat with OT)  Short Term Goals: Week 1:  PT Short Term Goal 1 (Week 1): patient will be able to perform bed mobility with mod-Assist. PT Short Term Goal 2 (Week 1): Patient will be able to perform transfers with Mod-Assist. PT Short Term Goal 3 (Week 1): Patient will be able to ambulate 10' using LRAD with Max-Assist  PT Short Term Goal 4 (Week 1): Patient will be able to maintain dynamic sitting balance with Mod-assist  Skilled Therapeutic Interventions/Progress Updates:   Patient's daughter present; patient awake and sitting upright in bed; patient agreeable to PT this pm.  Pt with foley out and agreeable to attempt to use BSC.  Assisted with supine > sit by advancing RLE to EOB and max-total A to bring trunk upright and weight shift to L side to sit EOB.  Performed squat pivot transfer bed > BSC with patient reaching to L for arm rest on BSC to facilitate forward and L lateral weight shift and pivot performed over the back with assistance to block R knee when pivoting/stepping with LLE.  Patient required extra time on [place] to attempt to urinate and have BM.  Pt unable to void, +2 assistance with one person assisting with max A as pt required manual facilitation at hips and blocking at Rt knee to maintain upright standing posture while second person completes hygiene and clothing management. Once back in w/c fit pt with GivMohr sling to provide support to flaccid RUE during standing and increase positioning of Rt shoulder to decrease risk of subluxation and shoulder pain and to assist with trunk control and balance. Engaged in standing at sink to ensure proper fit of sling to support Rt arm in functional position, pt required visual and tactile cues to promote upright standing balance.   Patient encouraged to remain up in w/c to promote postural control strengthening.  Pt agreed but wanted to be at nursing station around other people.    Therapy Documentation Precautions:  Precautions Precautions: Fall Precaution Comments: expressive aphasia Restrictions Weight Bearing Restrictions: No Pain: Pain Assessment Pain Assessment: No/denies pain  See FIM for current functional status  Therapy/Group: Co-Treatment  [name] Faucette 03/18/2013, 4:05 PM

## 2013-03-18 NOTE — Progress Notes (Signed)
Occupational Therapy Session Note  Patient Details  Name: Frank Weeks MRN: [id number] Date of Birth: 1929-10-03  Today's Date: 03/18/2013 Time: 5621-3086 and 5784-6962 (co-tx with PT 9528-4132) Time Calculation (min): 45 min and 30 min  Short Term Goals: Week 1:  OT Short Term Goal 1 (Week 1): Pt will sit EOB for 2 mins with supervision during self-care task  OT Short Term Goal 2 (Week 1): Pt will complete UB bathing with min assist and less than 25% cues OT Short Term Goal 3 (Week 1): Pt will complete UB dressing with min assist OT Short Term Goal 4 (Week 1): Pt will complete LB bathing with mod assist OT Short Term Goal 5 (Week 1): Pt will complete squat pivot transfer to [facility] with max assist of 1 person  Skilled Therapeutic Interventions/Progress Updates:    1) Pt seen for ADL retraining at bed level as pt refusing to get OOB.  Focus on bed mobility, attention to task, and problem solving with bathing.  Engaged in rolling in bed with mod facilitation at RLE and trunk for pericare and LB dressing.  Noted increased extensor tone in RLE when attempting to flex knee to complete LB dressing.  Pt requiring increased encouragement throughout session for participation.  Engaged in oral hygiene sitting upright in bed with max cues to spit and not swallow toothpaste.  Pt with episode of coughing, encouraged throat clear and swallow.  Frequent cues to open eyes, pt fatigued throughout session.  2) Pt seen for co-tx with PT with focus on transfers and improved body positioning with support of RUE in sitting and standing.  Pt willing to get OOB to participate in treatment session.  Performed squat pivot transfer to drop arm commode as pt reports wanting to attempt to void.  Pt unable to void, +2 assistance for clothing management as pt required manual facilitation at hips and blocking at Rt knee to maintain upright standing posture while second person completes hygiene and clothing management.  Fit pt with  Giv[employer]r sling to provide support to flaccid RUE during standing and increase positioning of Rt shoulder to decrease risk of subluxation and shoulder pain. Engaged in standing at sink to ensure proper fit of sling to support Rt arm in functional position, pt required visual and tactile cues to promote upright standing balance.  Therapy Documentation Precautions:  Precautions Precautions: Fall Precaution Comments: expressive aphasia Restrictions Weight Bearing Restrictions: No General: General Amount of Missed OT Time (min): 15 Minutes Pain: Pain Assessment Pain Assessment: No/denies pain  See FIM for current functional status  Therapy/Group: Individual Therapy and Co-Treatment  Leonette Monarch 03/18/2013, 10:19 AM

## 2013-03-18 NOTE — Plan of Care (Signed)
Problem: RH BLADDER ELIMINATION Goal: RH STG MANAGE BLADDER WITH EQUIPMENT WITH ASSISTANCE STG Manage Bladder With Equipment With mod Assistance  Outcome: Not Progressing Condom cath at night to manage incontinence so pt able to rest, staff managing

## 2013-03-18 NOTE — Plan of Care (Signed)
Problem: RH KNOWLEDGE DEFICIT Goal: RH STG INCREASE KNOWLEDGE OF DYSPHAGIA/FLUID INTAKE With teach back, patient's caregiver will demo understanding of dysphagia/fluid intake  Outcome: Progressing Patient's wife not present at time of discussion

## 2013-03-19 ENCOUNTER — Encounter: Payer: Medicare Other | Admitting: Internal Medicine

## 2013-03-19 ENCOUNTER — Inpatient Hospital Stay (HOSPITAL_COMMUNITY): Payer: Medicare Other | Admitting: Occupational Therapy

## 2013-03-19 ENCOUNTER — Inpatient Hospital Stay (HOSPITAL_COMMUNITY): Payer: Medicare Other | Admitting: Speech Pathology

## 2013-03-19 ENCOUNTER — Encounter (HOSPITAL_COMMUNITY): Payer: Medicare Other

## 2013-03-19 ENCOUNTER — Inpatient Hospital Stay (HOSPITAL_COMMUNITY): Payer: Medicare Other | Admitting: *Deleted

## 2013-03-19 DIAGNOSIS — I69991 Dysphagia following unspecified cerebrovascular disease: Secondary | ICD-10-CM

## 2013-03-19 LAB — CBC WITH DIFFERENTIAL/PLATELET
Basophils Absolute: 0 10*3/uL (ref 0.0–0.1)
Eosinophils Relative: 1 % (ref 0–5)
Lymphs Abs: 1.8 10*3/uL (ref 0.7–4.0)
MCH: 32.4 pg (ref 26.0–34.0)
MCV: 94.4 fL (ref 78.0–100.0)
Monocytes Absolute: 1.7 10*3/uL — ABNORMAL HIGH (ref 0.1–1.0)
Platelets: 261 10*3/uL (ref 150–400)
RDW: 13.2 % (ref 11.5–15.5)

## 2013-03-19 LAB — BASIC METABOLIC PANEL
BUN: 24 mg/dL — ABNORMAL HIGH (ref 6–23)
CO2: 24 mEq/L (ref 19–32)
Calcium: 9.5 mg/dL (ref 8.4–10.5)
Creatinine, Ser: 0.87 mg/dL (ref 0.50–1.35)
Glucose, Bld: 123 mg/dL — ABNORMAL HIGH (ref 70–99)

## 2013-03-19 MED ORDER — SODIUM CHLORIDE 0.9 % IV SOLN
INTRAVENOUS | Status: DC
  Administered 2013-03-21 – 2013-03-27 (×7): via INTRAVENOUS

## 2013-03-19 NOTE — Progress Notes (Signed)
Social Work Patient ID: Frank Weeks, male   DOB: 1929-05-04, 79 y.o.   MRN: [id number] Met with pt, wife and son who were here late yesterday to discuss team conference goals-min level and length of stay of 4 weeks.  Aware of being treated now for thrush and UTI. Hoping once antibiotics begin working pt will feel better and be able to eat more and participate in therapies.  Neuro-psych to eval today to assist with coping and depression. Wife wants to take him home, but it will depend upon pt's progress here and if she can manage his care.  Will await conference next week to see his progress, participation and Mood.  Aware pt's depressed feelings are normal after suffering a stroke.  Continue to work on a safe discharge plan and see how pt is progressing next week.

## 2013-03-19 NOTE — Progress Notes (Signed)
Speech Language Pathology Daily Session Note  Patient Details  Name: Frank Weeks MRN: [id number] Date of Birth: [birth year]  Today's Date: 03/19/2013 Time: 4098-1191 Time Calculation (min): 40 min  Short Term Goals: Week 1: SLP Short Term Goal 1 (Week 1): Patient will perform oral motor exercises with min A multimodal cueing to improve labial/lingual strength. (New goal) SLP Short Term Goal 2 (Week 1): Patient will perform pharyngeal strengthening exercises with min A multimodal cueing to decrease aspiration risk.  SLP Short Term Goal 3 (Week 1): Patient will answer yes/no questions with 90% accuracy.  SLP Short Term Goal 4 (Week 1): Patient will follow one-step commands with 90% accuracy.  SLP Short Term Goal 5 (Week 1): Patient will use compensatory strategies to increase speech intelligibility with min A.  SLP Short Term Goal 6 (Week 1): Patient will sustain attention to basic, familiar tasks for 5 minutes with Supervision level verbal cues.  Skilled Therapeutic Interventions: Skilled therapy session focused on addressing dysphagia and speech goals.  SLP facilitated session with breakfast of Dys.2 textures and Nectar-thick liquids with limited intake today; patient took about 4 bites of breakfast and then refused.  SLP provided Max assist encouragement to verbalize yes/no responses during session in addition to usual head nod.  Patient exhibited delayed cough x1 following straw sips, which SLP suspects could be due to volume.  Clinician cues for throat clears appeared more effective then cough attempts.  SLP also facilitated session with basic conversation regarding biographical information and Max assist models to produce 2 syllable words with pauses to allow for increased breath support.     FIM:  Comprehension Comprehension: 3-Understands basic 50 - 74% of the time/requires cueing 25 - 50%  of the time Expression Expression: 1-Expresses basis less than 25% of the time/requires cueing  greater than 75% of the time. Social Interaction Social Interaction: 4-Interacts appropriately 75 - 89% of the time - Needs redirection for appropriate language or to initiate interaction. Problem Solving Problem Solving: 3-Solves basic 50 - 74% of the time/requires cueing 25 - 49% of the time Memory Memory: 3-Recognizes or recalls 50 - 74% of the time/requires cueing 25 - 49% of the time FIM - Eating Eating Activity: 4: Helper checks for pocketed food  Pain Pain Assessment Pain Assessment: No/denies pain  Therapy/Group: Individual Therapy  Charlane Ferretti., CCC-SLP [phone]  Ace Bergfeld 03/19/2013, 1:12 PM

## 2013-03-19 NOTE — Progress Notes (Signed)
Occupational Therapy Session Note  Patient Details  Name: Frank Weeks MRN: [id number] Date of Birth: 04-25-1929  Today's Date: 03/19/2013 Time: 0930-1022 Time Calculation (min): 52 min  Short Term Goals: Week 1:  OT Short Term Goal 1 (Week 1): Pt will sit EOB for 2 mins with supervision during self-care task  OT Short Term Goal 2 (Week 1): Pt will complete UB bathing with min assist and less than 25% cues OT Short Term Goal 3 (Week 1): Pt will complete UB dressing with min assist OT Short Term Goal 4 (Week 1): Pt will complete LB bathing with mod assist OT Short Term Goal 5 (Week 1): Pt will complete squat pivot transfer to [facility] with max assist of 1 person  Skilled Therapeutic Interventions/Progress Updates:    Pt seen for ADL retraining with focus on bed mobility, squat pivot transfers, sit <> stand, and hemi-technique with bathing and dressing.  Pt in bed upon arrival and willing to get OOB to participate in therapy.  Engaged in perineal hygiene at bed level with focus on rolling Rt and Lt to clean and don brief.  Pt required mod assist and facilitation of RLE and RUE to increase positioning for bed mobility when rolling to Lt.  Squat pivot transfer from bed to w/c with max-total assist with pt reaching for arm rest and over the back technique to increase forward weight shift.  Pt with increased trunk control in supported sitting this session with UB bathing and dressing. Grooming completed in sitting with pt completing shaving with electric razor with Lt hand.  Pt requested to return to bed.  Squat pivot to bed and use of BLE to assist in bridging to reposition hips.  Therapy Documentation Precautions:  Precautions Precautions: Fall Precaution Comments: expressive aphasia Restrictions Weight Bearing Restrictions: No General: General Amount of Missed OT Time (min): 8 Minutes Vital Signs: Therapy Vitals Temp: 98.3 F (36.8 C) Temp src: Oral Pulse Rate: 70 Resp: 20 BP: 107/71  mmHg Patient Position, if appropriate: Lying Oxygen Therapy SpO2: 95 % O2 Device: None (Room air) Pain: Pain Assessment Pain Assessment: No/denies pain  See FIM for current functional status  Therapy/Group: Individual Therapy  Leonette Monarch 03/19/2013, 10:31 AM

## 2013-03-19 NOTE — Progress Notes (Signed)
Patient ID: Frank Weeks, male   DOB: Mar 11, 1929, 79 y.o.   MRN: [id number] Subjective/Complaints: 79 y.o. right-handed male with history of CAD/CABG, PAF pacemaker, subarachnoid hemorrhage 50 years ago CVA. Admitted 03/08/2013 with fall reported by wife with right-sided weakness and slurred speech. Cranial CT showed developing low density left basal ganglia external capsule region consistent with acute infarct. Carotid Dopplers with no ICA stenosis. Echocardiogram with ejection fraction of 60% and normal systolic function. CT angiogram of the head showed a filling defect throughout the left M1 segment compatible with embolus causing acute infarct in left basal ganglia.  Per therapy not very motivated for therapy also with poor endurance    Review of Systems  Unable to perform ROS: medical condition   Objective: Vital Signs: Blood pressure 107/71, pulse 70, temperature 98.3 F (36.8 C), temperature source Oral, resp. rate 20, height 5\' 8"  (1.727 m), weight 72.893 kg (160 lb 11.2 oz), SpO2 95.00%. No results found. Results for orders placed during the hospital encounter of 03/13/13 (from the past 72 hour(s))  URINALYSIS, ROUTINE W REFLEX MICROSCOPIC     Status: Abnormal   Collection Time    03/18/13  9:30 AM      Result Value Range   Color, Urine YELLOW  YELLOW   APPearance HAZY (*) CLEAR   Specific Gravity, Urine 1.027  1.005 - 1.030   pH 5.5  5.0 - 8.0   Glucose, UA NEGATIVE  NEGATIVE mg/dL   Hgb urine dipstick LARGE (*) NEGATIVE   Bilirubin Urine NEGATIVE  NEGATIVE   Ketones, ur NEGATIVE  NEGATIVE mg/dL   Protein, ur NEGATIVE  NEGATIVE mg/dL   Urobilinogen, UA 1.0  0.0 - 1.0 mg/dL   Nitrite POSITIVE (*) NEGATIVE   Leukocytes, UA LARGE (*) NEGATIVE  URINE MICROSCOPIC-ADD ON     Status: Abnormal   Collection Time    03/18/13  9:30 AM      Result Value Range   Squamous Epithelial / LPF RARE  RARE   WBC, UA TOO NUMEROUS TO COUNT  <3 WBC/hpf   RBC / HPF 21-50  <3 RBC/hpf   Bacteria,  UA MANY (*) RARE        Assessment/Plan: 1. Functional deficits secondary to Left MCA distribution infarct which require 3+ hours per day of interdisciplinary therapy in a comprehensive inpatient rehab setting. Physiatrist is providing close team supervision and 24 hour management of active medical problems listed below. Physiatrist and rehab team continue to assess barriers to discharge/monitor patient progress toward functional and medical goals.  FIM: FIM - Bathing Bathing Steps Patient Completed: Chest;Left Arm;Abdomen;Buttocks;Right upper leg;Left upper leg Bathing: 3: Mod-Patient completes 5-7 91f 10 parts or 50-74%  FIM - Upper Body Dressing/Undressing Upper body dressing/undressing steps patient completed: Thread/unthread left sleeve of pullover shirt/dress;Put head through opening of pull over shirt/dress Upper body dressing/undressing: 3: Mod-Patient completed 50-74% of tasks FIM - Lower Body Dressing/Undressing Lower body dressing/undressing steps patient completed: Thread/unthread left pants leg Lower body dressing/undressing: 1: Total-Patient completed less than 25% of tasks  FIM - Toileting Toileting: 1: Two helpers  FIM - Diplomatic Services operational officer Devices: Bedside commode (drop arm) Toilet Transfers: 2-To toilet/BSC: Max A (lift and lower assist);2-From toilet/BSC: Max A (lift and lower assist);1-Two helpers  FIM - Banker Devices: Bed rails;Arm rests;HOB elevated Bed/Chair Transfer: 1: Supine > Sit: Total A (helper does all/Pt. < 25%);1: Two helpers  FIM - Locomotion: Wheelchair Distance: 150 Locomotion: Wheelchair: 1: Total Assistance/staff pushes  wheelchair (Pt<25%) FIM - Locomotion: Ambulation Ambulation/Gait Assistance: Not tested (comment) Locomotion: Ambulation: 0: Activity did not occur  Comprehension Comprehension Mode: Auditory Comprehension: 3-Understands basic 50 - 74% of the time/requires  cueing 25 - 50%  of the time  Expression Expression Mode: Verbal Expression: 1-Expresses basis less than 25% of the time/requires cueing greater than 75% of the time.  Social Interaction Social Interaction: 4-Interacts appropriately 75 - 89% of the time - Needs redirection for appropriate language or to initiate interaction.  Problem Solving Problem Solving: 3-Solves basic 50 - 74% of the time/requires cueing 25 - 49% of the time  Memory Memory: 3-Recognizes or recalls 50 - 74% of the time/requires cueing 25 - 49% of the time  Medical Problem List and Plan:  1. 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 DVT Prophylaxis/Anticoagulation: Eliquis  3. Pain Management: Tylenol as needed  4. Neuropsych: This patient is not capable of making decisions on his/her own behalf.  5. Dysphasia. Dysphagia 2 nectar thick liquids. Monitor for any signs of aspiration. Followup speech therapy  -encourage adequate PO intake  6. PAF/CAD/ CABG. Cardiac rate controlled. Continue Eliquis as directed. Followup cardiology services as needed  7. Hyperlipidemia. Crestor  8. Glaucoma. Continue eyedrops  9. Irritable bowel syndrome.Amitiza twice a day and MiraLAX daily.  -acute service to give bowel prep before transfer? 10. Rule out UTI urinalysis + on cipro pending culture  LOS (Days) 6 A FACE TO FACE EVALUATION WAS PERFORMED  [name],[name] 03/19/2013, 7:43 AM

## 2013-03-19 NOTE — Consult Note (Signed)
NEUROCOGNITIVE STATUS EXAMINATION - [facility]   Frank Weeks is an 79 year old, right-handed, married, Caucasian man, who was referred for a neurocognitive status examination to assess his emotional state and mental status post-stroke.  According to his medical record, he was admitted on 03/08/13 following a fall with right-sided weakness and slurred speech.  Cranial CT demonstrated developing low density left basal ganglia external capsule region acute infarct.  He did not receive TPA.  His history is also reportedly significant for prior stroke several years ago and aneurysm at age 89.  According to his treatment team, he has begun reporting low mood in response to his physical and cognitive deficits.    Mr. Fiumara was unable to be roused to participate in the session today.  His wife was present and provided some background information.  Regarding his current state, she mentioned that he has a bladder infection that has caused him to become extremely lethargic and he has not been able to participate in various therapies today.  She also mentioned that he complained of a headache this morning and has not consumed any food or water today.  She was concerned about dehydration and also whether he could have had another stroke.  Her concerns were relayed to the PA and he said that he would follow up with her.    From a cognitive standpoint, [name] stated that while her husband's expressive speech is aphasic, his receptive speech appears to be intact, as he will nod appropriately to indicate answers to various questions.  She said that he does not seem to be having trouble in other areas of cognitive functioning at this time.  In terms of emotional reactions, she described his mood as "depressed," but felt as though it was within the setting of a normal adjustment reaction to his medical condition.  Notably, she mentioned that he has said in the past that he would "never  want to live like this," but she did not express concern that he was contemplating self-harm.    During this visit, [name] had several questions regarding the timeline for recovery in her husband's case as well as how to best help him during this time.  Her affect appeared quite anxious and she described significant fatigue secondary to poor sleep because of worry about her husband's condition.  I provided psychoeducation regarding timeline for recovery in cases of stroke.  We also spent time discussing the importance of self-care so that she can provide assistance to her husband.    Plan:  Given that [name] was not able to participate in today's evaluation, he will be evaluated next week by Orie Fisherman, PsyD. to obtain the necessary information to assist his treatment team.    DIAGNOSES: Stroke syndrome R/O Adjustment disorder with depressed mood  Leavy Cella, Psy.D.  Clinical Neuropsychologist

## 2013-03-19 NOTE — Progress Notes (Signed)
Physical Therapy Session Note  Patient Details  Name: Frank Weeks MRN: [id number] Date of Birth: 03/04/1929  Today's Date: 03/19/2013 Time: 1300-1320 Time Calculation (min): 20 min  Short Term Goals: Week 1:  PT Short Term Goal 1 (Week 1): patient will be able to perform bed mobility with mod-Assist. PT Short Term Goal 2 (Week 1): Patient will be able to perform transfers with Mod-Assist. PT Short Term Goal 3 (Week 1): Patient will be able to ambulate 10' using LRAD with Max-Assist  PT Short Term Goal 4 (Week 1): Patient will be able to maintain dynamic sitting balance with Mod-assist  Skilled Therapeutic Interventions/Progress Updates:  Pt resting in bed upon arrival, shaking his head "no" for participation in PT. Pt reminded on importance of working in therapies to the degree he is able, and discussed options for the day's treatment. Pt still unwilling to participate. Therapist went to recruit wife for help from family room, but he was still unwilling to engage in bedside tx. His wife reports he seems less responsive today than previous days, thinking it may be from feeling poorly from UTI. Pt was agreeable to reposition in bed for increased comfort. Therapist held RLE in hooklying and pt able to bend L knee, but unable to advance hips to [facility]. Scooted HOB with +2 assist, pt given cues to perform pushing with LEs as able. Performed rolling with Mod A and rails R/L to adjust clothing and bedding. Pt reported being more comfortable, but now not opening eyes to engage with therapist. Wife called PA to come talk in room.        Therapy Documentation Precautions:  Precautions Precautions: Fall Precaution Comments: expressive aphasia Restrictions Weight Bearing Restrictions: No General: Amount of Missed PT Time (min): 40 Minutes Missed Time Reason: Patient unwilling/refused to participate without medical reason   Pain: No complaints      See FIM for current functional  status  Therapy/Group: Individual Therapy  Clydene Laming, PT, DPT  03/19/2013, 1:33 PM

## 2013-03-20 ENCOUNTER — Encounter (HOSPITAL_COMMUNITY): Payer: Medicare Other | Admitting: Occupational Therapy

## 2013-03-20 ENCOUNTER — Inpatient Hospital Stay (HOSPITAL_COMMUNITY): Payer: Medicare Other | Admitting: Speech Pathology

## 2013-03-20 ENCOUNTER — Inpatient Hospital Stay (HOSPITAL_COMMUNITY): Payer: Medicare Other | Admitting: Physical Therapy

## 2013-03-20 LAB — URINE CULTURE: Colony Count: >=100000

## 2013-03-20 MED ORDER — BOOST / RESOURCE BREEZE PO LIQD
1.0000 | Freq: Two times a day (BID) | ORAL | Status: DC
  Administered 2013-03-21 – 2013-03-23 (×4): 1 via ORAL

## 2013-03-20 MED ORDER — ADULT MULTIVITAMIN W/MINERALS CH
1.0000 | ORAL_TABLET | Freq: Every day | ORAL | Status: DC
  Administered 2013-03-20 – 2013-04-06 (×18): 1 via ORAL
  Filled 2013-03-20 (×20): qty 1

## 2013-03-20 NOTE — Progress Notes (Signed)
Patient ID: Frank Weeks, male   DOB: [birth year], 79 y.o.   MRN: [id number] Subjective/Complaints: 79 y.o. right-handed male with history of CAD/CABG, PAF pacemaker, subarachnoid hemorrhage 50 years ago CVA. Admitted 03/08/2013 with fall reported by wife with right-sided weakness and slurred speech. Cranial CT showed developing low density left basal ganglia external capsule region consistent with acute infarct. Carotid Dopplers with no ICA stenosis. Echocardiogram with ejection fraction of 60% and normal systolic function. CT angiogram of the head showed a filling defect throughout the left M1 segment compatible with embolus causing acute infarct in left basal ganglia.  Per therapy not very motivated for therapy also with poor endurance Psych not able to eval r/t aphasia   Review of Systems  Unable to perform ROS: medical condition   Objective: Vital Signs: Blood pressure 108/74, pulse 70, temperature 97.9 F (36.6 C), temperature source Oral, resp. rate 18, height 5\' 8"  (1.727 m), weight 72.893 kg (160 lb 11.2 oz), SpO2 95.00%. No results found. Results for orders placed during the hospital encounter of 03/13/13 (from the past 72 hour(s))  URINALYSIS, ROUTINE W REFLEX MICROSCOPIC     Status: Abnormal   Collection Time    03/18/13  9:30 AM      Result Value Range   Color, Urine YELLOW  YELLOW   APPearance HAZY (*) CLEAR   Specific Gravity, Urine 1.027  1.005 - 1.030   pH 5.5  5.0 - 8.0   Glucose, UA NEGATIVE  NEGATIVE mg/dL   Hgb urine dipstick LARGE (*) NEGATIVE   Bilirubin Urine NEGATIVE  NEGATIVE   Ketones, ur NEGATIVE  NEGATIVE mg/dL   Protein, ur NEGATIVE  NEGATIVE mg/dL   Urobilinogen, UA 1.0  0.0 - 1.0 mg/dL   Nitrite POSITIVE (*) NEGATIVE   Leukocytes, UA LARGE (*) NEGATIVE  URINE MICROSCOPIC-ADD ON     Status: Abnormal   Collection Time    03/18/13  9:30 AM      Result Value Range   Squamous Epithelial / LPF RARE  RARE   WBC, UA TOO NUMEROUS TO COUNT  <3 WBC/hpf   RBC /  HPF 21-50  <3 RBC/hpf   Bacteria, UA MANY (*) RARE  URINE CULTURE     Status: None   Collection Time    03/18/13  9:30 AM      Result Value Range   Specimen Description URINE, CATHETERIZED     Special Requests NONE     Culture  Setup Time 03/18/2013 18:04     Colony Count >=100,000 COLONIES/ML     Culture ESCHERICHIA COLI     Report Status PENDING    BASIC METABOLIC PANEL     Status: Abnormal   Collection Time    03/19/13  3:34 PM      Result Value Range   Sodium 138  135 - 145 mEq/L   Potassium 4.2  3.5 - 5.1 mEq/L   Chloride 103  96 - 112 mEq/L   CO2 24  19 - 32 mEq/L   Glucose, Bld 123 (*) 70 - 99 mg/dL   BUN 24 (*) 6 - 23 mg/dL   Creatinine, Ser 0.86  0.50 - 1.35 mg/dL   Calcium 9.5  8.4 - 57.8 mg/dL   GFR calc non Af Amer 78 (*) >90 mL/min   GFR calc Af Amer >90  >90 mL/min   Comment:            The eGFR has been calculated     using the  CKD EPI equation.     This calculation has not been     validated in all clinical     situations.     eGFR's persistently     <90 mL/min signify     possible Chronic Kidney Disease.  CBC WITH DIFFERENTIAL     Status: Abnormal   Collection Time    03/19/13  3:34 PM      Result Value Range   WBC 15.1 (*) 4.0 - 10.5 K/uL   RBC 4.78  4.22 - 5.81 MIL/uL   Hemoglobin 15.5  13.0 - 17.0 g/dL   HCT 16.1  09.6 - 04.5 %   MCV 94.4  78.0 - 100.0 fL   MCH 32.4  26.0 - 34.0 pg   MCHC 34.4  30.0 - 36.0 g/dL   RDW 40.9  81.1 - 91.4 %   Platelets 261  150 - 400 K/uL   Neutrophils Relative % 76  43 - 77 %   Lymphocytes Relative 12  12 - 46 %   Monocytes Relative 11  3 - 12 %   Eosinophils Relative 1  0 - 5 %   Basophils Relative 0  0 - 1 %   Neutro Abs 11.4 (*) 1.7 - 7.7 K/uL   Lymphs Abs 1.8  0.7 - 4.0 K/uL   Monocytes Absolute 1.7 (*) 0.1 - 1.0 K/uL   Eosinophils Absolute 0.2  0.0 - 0.7 K/uL   Basophils Absolute 0.0  0.0 - 0.1 K/uL   WBC Morphology FEW ATYPICAL LYMPHS NOTED          Assessment/Plan: 1. Functional deficits  secondary to Left MCA distribution infarct which require 3+ hours per day of interdisciplinary therapy in a comprehensive inpatient rehab setting. Physiatrist is providing close team supervision and 24 hour management of active medical problems listed below. Physiatrist and rehab team continue to assess barriers to discharge/monitor patient progress toward functional and medical goals.  FIM: FIM - Bathing Bathing Steps Patient Completed: Chest;Left Arm;Abdomen;Right upper leg;Left upper leg Bathing: 3: Mod-Patient completes 5-7 52f 10 parts or 50-74%  FIM - Upper Body Dressing/Undressing Upper body dressing/undressing steps patient completed: Thread/unthread left sleeve of pullover shirt/dress;Put head through opening of pull over shirt/dress Upper body dressing/undressing: 3: Mod-Patient completed 50-74% of tasks FIM - Lower Body Dressing/Undressing Lower body dressing/undressing steps patient completed: Thread/unthread left pants leg Lower body dressing/undressing: 1: Two helpers  FIM - Toileting Toileting: 1: Two helpers  FIM - Diplomatic Services operational officer Devices: Bedside commode (drop arm) Toilet Transfers: 2-To toilet/BSC: Max A (lift and lower assist);2-From toilet/BSC: Max A (lift and lower assist);1-Two helpers  FIM - Banker Devices: Bed rails;Arm rests;HOB elevated Bed/Chair Transfer: 1: Supine > Sit: Total A (helper does all/Pt. < 25%);1: Bed > Chair or W/C: Total A (helper does all/Pt. < 25%);1: Chair or W/C > Bed: Total A (helper does all/Pt. < 25%);2: Sit > Supine: Max A (lifting assist/Pt. 25-49%)  FIM - Locomotion: Wheelchair Distance: 150 Locomotion: Wheelchair: 1: Total Assistance/staff pushes wheelchair (Pt<25%) FIM - Locomotion: Ambulation Ambulation/Gait Assistance: Not tested (comment) Locomotion: Ambulation: 0: Activity did not occur  Comprehension Comprehension Mode: Auditory Comprehension:  3-Understands basic 50 - 74% of the time/requires cueing 25 - 50%  of the time  Expression Expression Mode: Verbal Expression: 1-Expresses basis less than 25% of the time/requires cueing greater than 75% of the time.  Social Interaction Social Interaction: 3-Interacts appropriately 50 - 74% of the time - May  be physically or verbally inappropriate.  Problem Solving Problem Solving: 2-Solves basic 25 - 49% of the time - needs direction more than half the time to initiate, plan or complete simple activities  Memory Memory: 3-Recognizes or recalls 50 - 74% of the time/requires cueing 25 - 49% of the time  Medical Problem List and Plan:  1. 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 DVT Prophylaxis/Anticoagulation: Eliquis  3. Pain Management: Tylenol as needed  4. Neuropsych: This patient is not capable of making decisions on his/her own behalf.  5. Dysphasia. Dysphagia 2 nectar thick liquids. Monitor for any signs of aspiration. Followup speech therapy  -encourage adequate PO intake  6. PAF/CAD/ CABG. Cardiac rate controlled. Continue Eliquis as directed. Followup cardiology services as needed  7. Hyperlipidemia. Crestor  8. Glaucoma. Continue eyedrops  9. Irritable bowel syndrome.Amitiza twice a day and MiraLAX daily.  -acute service to give bowel prep before transfer? 10. UTI urinalysis + on cipro pending culture  LOS (Days) 7 A FACE TO FACE EVALUATION WAS PERFORMED  [name],[name] 03/20/2013, 8:27 AM

## 2013-03-20 NOTE — Progress Notes (Signed)
Physical Therapy Weekly Progress Note  Patient Details  Name: Frank Weeks MRN: [id number] Date of Birth: 1929-08-27  Today's Date: 03/20/2013 Time: 1400-1505 Time Calculation (min): 65 min  Patient has made slow progress and has met 1 of 4 short term goals.  Patient continues to require total A for bed mobility, bed <> w/c transfers, w/c mobility and mod-max A for dynamic sitting balance during functional activities.  Patient's progress has been limited by poor activity tolerance and cardiopulmonary endurance, UTI, and low appetite with poor nutrition and low motivation to participate.  Referral to neuropsych for coping has been made.  Patient was also decreased to 15/7 to improve activity tolerance with therapy but patient continued to refuse multiple therapy sessions.  Secondary to lack of progress goals downgraded.  If patient continues to make slow progress and require increased physical assistance patient will likely require more prolonged rehabilitation at SNF prior to D/C home with wife who can provide supervision only.        Patient continues to demonstrate the following deficits: R hemiplegia, impaired activity tolerance and endurance, impaired postural control, balance and therefore will continue to benefit from skilled PT intervention to enhance overall performance with activity tolerance, balance, postural control, ability to compensate for deficits, functional use of  right upper extremity and right lower extremity, attention and awareness.  Patient not progressing toward long term goals.  See goal revision..  Plan of care revisions: goals downgraded secondary to lack of progress; home ambulation and w/c mobility and car transfer goals D/C secondary to patient likely SNF placement now.  PT Short Term Goals Week 1:  PT Short Term Goal 1 (Week 1): patient will be able to perform bed mobility with mod-Assist. PT Short Term Goal 1 - Progress (Week 1): Not met PT Short Term Goal 2 (Week 1):  Patient will be able to perform transfers with Mod-Assist. PT Short Term Goal 2 - Progress (Week 1): Not met PT Short Term Goal 3 (Week 1): Patient will be able to ambulate 10' using LRAD with Max-Assist  PT Short Term Goal 3 - Progress (Week 1): Not progressing PT Short Term Goal 4 (Week 1): Patient will be able to maintain dynamic sitting balance with Mod-assist PT Short Term Goal 4 - Progress (Week 1): Met Week 2:  PT Short Term Goal 1 (Week 2): Patient will be able to perform bed mobility with mod-Assist. PT Short Term Goal 2 (Week 2): Patient will be able to perform transfers with Mod-Assist. PT Short Term Goal 3 (Week 2): Patient will be able to ambulate 10' using LRAD with Max-Assist  PT Short Term Goal 4 (Week 2): Patient will be able to maintain dynamic sitting balance with Min-assist PT Short Term Goal 5 (Week 2): Patient will participate in w/c mobility with hemi technique with max A x 25'  Skilled Therapeutic Interventions/Progress Updates:   Patient's wife and daughter present; patient agreeable to therapy.  Performed bed mobility/rolling to R side with mod A for management of RLE into flexion and for full roll to R side.  Performed side > sit with max A to assist with full L lateral weight shift.  Sitting EOB donned shoes with min-mod A to maintain balance.  Performed transfers bed <> w/c to L and R with max A over the back squat pivot with patient use of LUE reaching for arm rest to facilitate forwards and lateral lean and counting to initiate squat with verbal cues to sequence pivot.  Performed  w/c mobility training in w/c with L hemi technique in controlled environment.  Unable to sequence use of LUE and LLE so transitioned to LLE propulsion only x 150' with max A to maintain momentum of propulsion and minimize compensation of rocking with trunk.  Once in gym patient performed static standing in standing frame with focus on WB through RLE with LLE stepping forwards, laterally and  medially, upright trunk control training, lateral weight shifting with reaching task, and supported squat <> stand with one sitting rest break.  Pt tolerated well but was fatigued.  Returned pt to bed and discussed with wife pt normal schedule at home.  She reports he was up and most active in am and after lunch took a 2-3 hour nap.  Recommending that therapy try to schedule patient for OT ADL in am followed by SLP and then return to bed at lunch for 2-3 hours and PT come at end of day.  Will attempt schedule Monday.     Therapy Documentation Precautions:  Precautions Precautions: Fall Precaution Comments: expressive aphasia Restrictions Weight Bearing Restrictions: No Vital Signs: Therapy Vitals Temp: 97.5 F (36.4 C) Temp src: Axillary Pulse Rate: 72 Resp: 18 BP: 143/82 mmHg Patient Position, if appropriate: Lying Oxygen Therapy SpO2: 95 % Pain: Pain Assessment Pain Assessment: No/denies pain Locomotion : Wheelchair Mobility Distance: 150   See FIM for current functional status  Therapy/Group: Individual Therapy  [name] [facility] 03/20/2013, 5:22 PM

## 2013-03-20 NOTE — Progress Notes (Signed)
INITIAL NUTRITION ASSESSMENT  DOCUMENTATION CODES Per approved criteria  -Severe malnutrition in the context of acute illness or injury   INTERVENTION: 1. Continue Ensure Pudding PO QID. 2. Continue Magic Cup with meals. 3. Add MVI daily. 4. Add Resource Breeze po BID, each supplement provides 250 kcal and 9 grams of protein. Please thicken to appropriate consistency. 5. RD to continue to follow nutrition care plan.  NUTRITION DIAGNOSIS: Inadequate oral intake related to poor appetite as evidenced by family report.   Goal: Intake to meet >90% of estimated nutrition needs.  Monitor:  weight trends, lab trends, I/O's, PO intake, supplement tolerance  Reason for Assessment: Low Braden Score  79 y.o. male  Admitting Dx: Embolic cerebral infarction  ASSESSMENT: PMHx of CAD/CABG, CVA 50 years ago. Admitted 5/25 s/p fall with acute infarct. Placed on Dysphagia 2 diet with Nectar Liquids by SLP.   RD drawn to chart 2/2 Low Braden Score. Pt is not motivated for therapy and is having poor endurance. Pt now being treated for thrush and UTI. Family is hoping that abx will help pt to feel better and increase appetite as well as participation in therapies.  Wife confirms that pt has had poor oral intake since rehab admission. She states that he was eating well during his acute admission. She states that he has only eaten a few bites of things over the past few days 2/2 not feeling well because of his UTI. Pt meets criteria for severe MALNUTRITION in the context of acute illness as evidenced by 3% wt loss x 1 week and intake of <50% x at least 5 days.  Height: Ht Readings from Last 1 Encounters:  03/13/13 5\' 8"  (1.727 m)    Weight: Wt Readings from Last 1 Encounters:  03/18/13 160 lb 11.2 oz (72.893 kg)    Ideal Body Weight: 154 lb  % Ideal Body Weight: 104%  Wt Readings from Last 10 Encounters:  03/18/13 160 lb 11.2 oz (72.893 kg)  03/09/13 165 lb (74.844 kg)  02/25/13 165 lb  (74.844 kg)  06/17/12 178 lb 6.4 oz (80.922 kg)  06/12/11 174 lb 1.9 oz (78.98 kg)  02/27/11 177 lb (80.287 kg)  10/19/10 175 lb (79.379 kg)  09/12/10 178 lb (80.74 kg)  02/28/10 173 lb (78.472 kg)  01/19/10 172 lb (78.019 kg)    Usual Body Weight: 165 lb  % Usual Body Weight: 97%  BMI:  Body mass index is 24.44 kg/(m^2). WNL  Estimated Nutritional Needs: Kcal: 1650 - 1800 Protein: 70 - 80 g Fluid: 1.8 - 2 liters  Skin: intact  Diet Order: Dysphagia 2; Nectar Liquids  EDUCATION NEEDS: -No education needs identified at this time   Intake/Output Summary (Last 24 hours) at 03/20/13 1526 Last data filed at 03/20/13 1040  Gross per 24 hour  Intake    440 ml  Output    425 ml  Net     15 ml    Last BM: 6/2  Labs:   Recent Labs Lab 03/16/13 0527 03/19/13 1534  NA 133* 138  K 4.6 4.2  CL 99 103  CO2 22 24  BUN 23 24*  CREATININE 0.90 0.87  CALCIUM 9.2 9.5  GLUCOSE 106* 123*    CBG (last 3)  No results found for this basename: GLUCAP,  in the last 72 hours  Scheduled Meds: . antiseptic oral rinse  15 mL Mouth Rinse q12n4p  . apixaban  5 mg Oral BID  . ciprofloxacin  250 mg Oral  BID  . feeding supplement  1 Container Oral QID  . lubiprostone  24 mcg Oral BID WC  . methylphenidate  5 mg Oral BID WC  . nystatin  5 mL Oral QID  . rosuvastatin  10 mg Oral q1800  . Travoprost (BAK Free)  1 drop Both Eyes QHS    Continuous Infusions: . sodium chloride Stopped (03/20/13 0710)    Past Medical History  Diagnosis Date  . Coronary artery disease     a. 2007 CABGx3: LIMA->LAD, VG->RI, VG->RCA;  b. 2010 [place] MV: nonischemic.  . Bradycardia     a. s/p PPM.  . PVD (peripheral vascular disease)   . HTN (hypertension)   . Dyslipidemia   . Subarachnoid hemorrhage     a. aneurysmal 1964, s/p clipping.  . Cerebrovascular disease     a. s/p R CEA;  b. 02/2013 Carotid U/S: no signif ICA stenosis.  Marland Kitchen GERD (gastroesophageal reflux disease)   . Stroke     a.  first @ age 53->memory deficits;  b. 02/2013 embolic stroke - left basal ganglia.  . Glaucoma   . Pleural effusion   . PAF (paroxysmal atrial fibrillation)     a. previously refused coumadin and felt to be poor coumadin candidate 2/2 falls/unsteady gait;  b. 02/2013 Echo: EF 55-60%, mild to mod AS.    Past Surgical History  Procedure Laterality Date  . Coronary artery bypass graft  03/18/2006  . Right carotid endarectomy  09/2004  . Left carotid to vertebral bypass    . Dual-chamber pacemaker implantation    . Thoracentesis and bronchoscopy      Jarold Motto MS, RD, LDN Pager: [phone] After-hours pager: [phone]

## 2013-03-20 NOTE — Plan of Care (Signed)
Problem: RH Car Transfers Goal: LTG Patient will perform car transfers with assist (PT) LTG: Patient will perform car transfers with assistance (PT).  Outcome: Not Applicable Date Met:  03/20/13 D/C secondary to SNF placement  Problem: RH Wheelchair Mobility Goal: LTG Patient will propel w/c in home environment (PT) LTG: Patient will propel wheelchair in home environment, # of feet with assistance (PT).  Outcome: Not Applicable Date Met:  03/20/13 D/C secondary to SNF placement

## 2013-03-20 NOTE — Progress Notes (Signed)
Occupational Therapy Weekly Progress Note  Patient Details  Name: Frank Weeks MRN: [id number] Date of Birth: 1929/08/07  Today's Date: 03/20/2013  Patient has met 2 of 5 short term goals.  Pt making slow progress towards goals with decreased participation in therapy sessions due to affect, UTI, and thrush.  Pt's scheduled has been reduced to 15 hrs/7 days.  Pt has progressed from 2 person assist with squat pivot transfers to max assist of one with pt reaching with LUE for arm rest of BSC or w/c to assist in transfer.  Pt continues to require 2 person assistance with toileting due to requiring manual facilitation to maintain standing balance while second person completes hygiene and clothing management.  Continue to encourage pt to participate in OOB treatment sessions.  Pt continues to fluctuate in his willingness to participate.  Patient continues to demonstrate the following deficits: flaccid RUE and RLE, decreased trunk control, impaired sitting and standing balance, impaired motor planning, motor control, timing, sequencing, and coordination, impaired attention and awareness and therefore will continue to benefit from skilled OT intervention to enhance overall performance with BADL and Reduce care partner burden.  Patient progressing toward long term goals..  Continue plan of care.  OT Short Term Goals Week 1:  OT Short Term Goal 1 (Week 1): Pt will sit EOB for 2 mins with supervision during self-care task  OT Short Term Goal 1 - Progress (Week 1): Progressing toward goal OT Short Term Goal 2 (Week 1): Pt will complete UB bathing with min assist and less than 25% cues OT Short Term Goal 2 - Progress (Week 1): Met OT Short Term Goal 3 (Week 1): Pt will complete UB dressing with min assist OT Short Term Goal 3 - Progress (Week 1): Progressing toward goal OT Short Term Goal 4 (Week 1): Pt will complete LB bathing with mod assist OT Short Term Goal 4 - Progress (Week 1): Progressing toward  goal OT Short Term Goal 5 (Week 1): Pt will complete squat pivot transfer to [facility] with max assist of 1 person OT Short Term Goal 5 - Progress (Week 1): Met Week 2:  OT Short Term Goal 1 (Week 2): Pt will sit EOB for 2 mins with supervision during self-care task  OT Short Term Goal 2 (Week 2): Pt will complete UB dressing with min assist OT Short Term Goal 3 (Week 2): Pt will complete LB bathing with mod assist OT Short Term Goal 4 (Week 2): Pt will complete LB dressing with max assist OT Short Term Goal 5 (Week 2): Pt will complete toileting with max assist of one caregiver  Therapy Documentation Precautions:  Precautions Precautions: Fall Precaution Comments: expressive aphasia Restrictions Weight Bearing Restrictions: No Vital Signs: Therapy Vitals Temp: 97.9 F (36.6 C) Temp src: Oral Pulse Rate: 70 Resp: 18 BP: 108/74 mmHg Patient Position, if appropriate: Lying Oxygen Therapy SpO2: 95 % O2 Device: None (Room air)  See FIM for current functional status  Therapy/Group: Individual Therapy  Leonette Monarch 03/20/2013, 7:25 AM

## 2013-03-20 NOTE — Plan of Care (Signed)
Problem: RH BOWEL ELIMINATION Goal: RH STG MANAGE BOWEL WITH ASSISTANCE STG Manage Bowel with mod Assistance.  Outcome: Not Progressing No bm since 03/16/13 .senna- s given at bedtime

## 2013-03-20 NOTE — Progress Notes (Signed)
Speech Language Pathology Daily Session Note  Patient Details  Name: Frank Weeks MRN: [id number] Date of Birth: 09-13-1929  Today's Date: 03/20/2013 Time: 2725-3664 Time Calculation (min): 30 min  Short Term Goals: Week 1: SLP Short Term Goal 1 (Week 1): Patient will perform oral motor exercises with min A multimodal cueing to improve labial/lingual strength. (New goal) SLP Short Term Goal 2 (Week 1): Patient will perform pharyngeal strengthening exercises with min A multimodal cueing to decrease aspiration risk.  SLP Short Term Goal 3 (Week 1): Patient will answer yes/no questions with 90% accuracy.  SLP Short Term Goal 4 (Week 1): Patient will follow one-step commands with 90% accuracy.  SLP Short Term Goal 5 (Week 1): Patient will use compensatory strategies to increase speech intelligibility with min A.  SLP Short Term Goal 6 (Week 1): Patient will sustain attention to basic, familiar tasks for 5 minutes with Supervision level verbal cues.  Skilled Therapeutic Interventions: Skilled treatment session focused on addressing cognitive-linguistic goals.  SLP facilitated session with Max assist verbal models for patient to successfully name 80% of basic ADL items.  Patient's errors occurred as initial phoneme omissions.  SLP attempted to fade cues to Max assist semantic and phonemic cues; however, patient's accuracy dropped to 0%.  Patient also participated in a card game with 50% accuracy verbalizing number of face card name without clinician assistance with Max assist cues to self-monitor errors.     FIM:  Comprehension Comprehension Mode: Auditory Comprehension: 3-Understands basic 50 - 74% of the time/requires cueing 25 - 50%  of the time Expression Expression Mode: Verbal Expression: 1-Expresses basis less than 25% of the time/requires cueing greater than 75% of the time. Social Interaction Social Interaction: 2-Interacts appropriately 25 - 49% of time - Needs frequent  redirection. Problem Solving Problem Solving: 2-Solves basic 25 - 49% of the time - needs direction more than half the time to initiate, plan or complete simple activities Memory Memory: 3-Recognizes or recalls 50 - 74% of the time/requires cueing 25 - 49% of the time  Pain Pain Assessment Pain Assessment: No/denies pain  Therapy/Group: Individual Therapy   Speech Language Pathology Weekly Progress Note  Patient Details  Name: Frank Weeks MRN: [id number] Date of Birth: [birth year]  Today's Date: 03/20/2013  Short Term Goals: Week 1: SLP Short Term Goal 1 (Week 1): Patient will perform oral motor exercises with min A multimodal cueing to improve labial/lingual strength. (New goal) SLP Short Term Goal 1 - Progress (Week 1): Progressing toward goal SLP Short Term Goal 2 (Week 1): Patient will perform pharyngeal strengthening exercises with min A multimodal cueing to decrease aspiration risk.  SLP Short Term Goal 2 - Progress (Week 1): Progressing toward goal SLP Short Term Goal 3 (Week 1): Patient will answer yes/no questions with 90% accuracy.  SLP Short Term Goal 3 - Progress (Week 1): Progressing toward goal SLP Short Term Goal 4 (Week 1): Patient will follow one-step commands with 90% accuracy.  SLP Short Term Goal 4 - Progress (Week 1): Met SLP Short Term Goal 5 (Week 1): Patient will use compensatory strategies to increase speech intelligibility with min A.  SLP Short Term Goal 5 - Progress (Week 1): Discontinued (comment) (aphasia appears to impact functio nmore than dysarthria) SLP Short Term Goal 6 (Week 1): Patient will sustain attention to basic, familiar tasks for 5 minutes with Supervision level verbal cues. SLP Short Term Goal 6 - Progress (Week 1): Met Week 2: SLP Short Term Goal 1 (  Week 2): Patient will perform oral motor and pharngeal strengthening exercises with Mod assist multimodal cueing  SLP Short Term Goal 2 (Week 2): Patient will answer yes/no questions with 90%  accuracy. SLP Short Term Goal 3 (Week 2): Patient will sustain attention to basic, familiar tasks for 10 minutes with Supervision level verbal cues. SLP Short Term Goal 4 (Week 2): Patient will name basic ADL objects with Max assist semantic and phonemic cues. SLP Short Term Goal 5 (Week 2): Patient will self-monitor and correct errors with Max assist clinician cues.  Weekly Progress Updates: Patient met 2 out of 6 short term objectives this reporting period due to gains in sustained attention and ability to follow 1 and 2 step commands during functional tasks.  Patient's other goals were not met due to a combination of reasons being he decreased endurance and participation as well as clinician's choice to target expressive speech goals more heavily than dysphagia goals.  Patient is tolerating current diet well and goals for oral motor and pharyngeal strengthening exercises will be target in the next reporting period.  During therapy sessions SLP determined that aphasia is appearing to impact functional communication more so than dysarthria and as a result this goal was added.  This patient continues to require Max assist for expression of basic needs and wants as well as for overall safety with basic tasks; as a result, he continues to require skilled SLP services to address these deficits, maximize functional independence and reduce burden of care prior to discharge home with wife.     SLP Intensity: Minumum of 1-2 x/day, 30 to 90 minutes SLP Frequency: 5 out of 7 days SLP Duration/Estimated Length of Stay: 2 weeks SLP Treatment/Interventions: Cognitive remediation/compensation;Dysphagia/aspiration precaution training;Patient/family education;Oral motor exercises;Therapeutic Exercise;Therapeutic Activities;Speech/Language facilitation;Functional tasks  Charlane Ferretti., CCC-SLP [phone]  Tityana Pagan 03/20/2013, 2:13 PM

## 2013-03-20 NOTE — Progress Notes (Signed)
Occupational Therapy Note  Patient Details  Name: CALLAWAY HAILES MRN: [id number] Date of Birth: 1928/11/28 Today's Date: 03/20/2013  Pt missed 60 mins skilled OT treatment session secondary to refusal.  Pt in bed upon arrival, shaking his head "no" for participation in bathing and dressing session.  Pt reminded of importance of participating in therapies to the degree he is able and discussed options for the day's treatment. Pt still unwilling to participate.  RN notified.  Leonette Monarch 03/20/2013, 10:07 AM

## 2013-03-20 NOTE — Progress Notes (Signed)
Social Work Patient ID: Frank Weeks, male   DOB: [birth year], 79 y.o.   MRN: [id number] Spoke with [employer] case manager who has approved pt until 6/12.  Will need an update then. Informed pt doing better with after being treated for UTI and thrush, along with trial of ritalin.

## 2013-03-21 ENCOUNTER — Inpatient Hospital Stay (HOSPITAL_COMMUNITY): Payer: Medicare Other | Admitting: *Deleted

## 2013-03-21 ENCOUNTER — Inpatient Hospital Stay (HOSPITAL_COMMUNITY): Payer: Medicare Other | Admitting: Occupational Therapy

## 2013-03-21 ENCOUNTER — Inpatient Hospital Stay (HOSPITAL_COMMUNITY): Payer: Medicare Other

## 2013-03-21 MED ORDER — AMOXICILLIN 500 MG PO CAPS
500.0000 mg | ORAL_CAPSULE | Freq: Two times a day (BID) | ORAL | Status: AC
  Administered 2013-03-21 – 2013-03-24 (×8): 500 mg via ORAL
  Filled 2013-03-21 (×10): qty 1

## 2013-03-21 MED ORDER — METHYLPHENIDATE HCL 5 MG PO TABS
10.0000 mg | ORAL_TABLET | Freq: Two times a day (BID) | ORAL | Status: DC
  Administered 2013-03-21 – 2013-04-06 (×32): 10 mg via ORAL
  Filled 2013-03-21 (×33): qty 2

## 2013-03-21 NOTE — Progress Notes (Signed)
Physical Therapy Note  Patient Details  Name: Frank Weeks MRN: [id number] Date of Birth: Oct 19, 1928 Today's Date: 03/21/2013  1415-1500 (45 minutes) individual Pain: no reported pain Focus of treatment: Neuro re-ed RT LE; gait in parallel bars to facilitate knee extension in stance Treatment: Pt up in wc; transfers - stand/turn mod assist to left; Neuro re-ed RT LE - hip flexion using Suspension Grid (gravity eliminated). Pt able to initiate partial active hip flexion and abduction (in supine) ; gait - 6 feet X 4 in parallel bars mod/max assist with ace wrap Rt ankle. Pt required assist to advance RT LE and maintained partial knee extension in stance.    Frank Weeks,Frank Weeks 03/21/2013, 2:57 PM

## 2013-03-21 NOTE — Progress Notes (Addendum)
Occupational Therapy Session Note  Patient Details  Name: Frank Weeks MRN: [id number] Date of Birth: [birth year]  Today's Date: 03/21/2013 Time:  -    1100-1215  (75 min)   Skilled Therapeutic Interventions/Progress Updates:       1st session:   Pt seen for ADL retraining with focus on bed mobility, squat pivot transfers, sit <> stand, and hemi-technique with bathing and dressing. Pt in bed upon arrival and willing to get OOB to participate in therapy.  Required max assist from supine to sidelying to sit .  Sat on EOB with supervision.  Did stand pivot transfer with total assist +2 , Pt= 50 %. Pt required [facility] assist to use RUE in bathing and dressing.  Did sit to stand with max assist and stood for peri care with max assist.   Stood x3 with each time pt doing more.  Pt. Shaved with mod assist with motor planning.  Left pt at nursing station with safety belt on.          2nd session:  Individual session:  Pain:  None:  Time:  1500-1555  (55 min) Engaged in therapeutic activities, RUE NMRE, transfers and static and dynamic sitting balance.  .  Pt. Transferred from wc to mat with stand pivot transfer and max assist.  Did RUE NMRE with weight bearing, and AAROM.  Enagaged rotational activities to right to engage more weight bearing on RUE.  Pt needed moderate assist with sitting balance in beginning; he would fall backwards or to the right.  As session progressed he was minimal assist with dynamic balance with ring toss.  Wife and son, Frank Weeks present during session.  Transfer back to left to wc was max assist.    Therapy Documentation Precautions:  Precautions Precautions: Fall Precaution Comments: expressive aphasia Restrictions Weight Bearing Restrictions: No      Pain:  None both sessions        See FIM for current functional status  Therapy   Individual session  Humberto Seals 03/21/2013, 12:32 PM

## 2013-03-21 NOTE — Progress Notes (Signed)
Patient ID: Frank Weeks, male   DOB: 24-May-1929, 79 y.o.   MRN: [id number] Subjective/Complaints: 79 y.o. right-handed male with history of CAD/CABG, PAF pacemaker, subarachnoid hemorrhage 50 years ago CVA. Admitted 03/08/2013 with fall reported by wife with right-sided weakness and slurred speech. Cranial CT showed developing low density left basal ganglia external capsule region consistent with acute infarct. Carotid Dopplers with no ICA stenosis. Echocardiogram with ejection fraction of 60% and normal systolic function. CT angiogram of the head showed a filling defect throughout the left M1 segment compatible with embolus causing acute infarct in left basal ganglia.  Patient did not remember a visit from his primary care physician this morning. Psych not able to eval r/t aphasia   Review of Systems  Unable to perform ROS: medical condition   Objective: Vital Signs: Blood pressure 109/71, pulse 70, temperature 97.6 F (36.4 C), temperature source Oral, resp. rate 17, height 5\' 8"  (1.727 m), weight 73 kg (160 lb 15 oz), SpO2 98.00%. No results found. Results for orders placed during the hospital encounter of 03/13/13 (from the past 72 hour(s))  URINALYSIS, ROUTINE W REFLEX MICROSCOPIC     Status: Abnormal   Collection Time    03/18/13  9:30 AM      Result Value Range   Color, Urine YELLOW  YELLOW   APPearance HAZY (*) CLEAR   Specific Gravity, Urine 1.027  1.005 - 1.030   pH 5.5  5.0 - 8.0   Glucose, UA NEGATIVE  NEGATIVE mg/dL   Hgb urine dipstick LARGE (*) NEGATIVE   Bilirubin Urine NEGATIVE  NEGATIVE   Ketones, ur NEGATIVE  NEGATIVE mg/dL   Protein, ur NEGATIVE  NEGATIVE mg/dL   Urobilinogen, UA 1.0  0.0 - 1.0 mg/dL   Nitrite POSITIVE (*) NEGATIVE   Leukocytes, UA LARGE (*) NEGATIVE  URINE MICROSCOPIC-ADD ON     Status: Abnormal   Collection Time    03/18/13  9:30 AM      Result Value Range   Squamous Epithelial / LPF RARE  RARE   WBC, UA TOO NUMEROUS TO COUNT  <3 WBC/hpf   RBC / HPF 21-50  <3 RBC/hpf   Bacteria, UA MANY (*) RARE  URINE CULTURE     Status: None   Collection Time    03/18/13  9:30 AM      Result Value Range   Specimen Description URINE, CATHETERIZED     Special Requests NONE     Culture  Setup Time 03/18/2013 18:04     Colony Count >=100,000 COLONIES/ML     Culture ESCHERICHIA COLI     Report Status 03/20/2013 FINAL     Organism ID, Bacteria ESCHERICHIA COLI    BASIC METABOLIC PANEL     Status: Abnormal   Collection Time    03/19/13  3:34 PM      Result Value Range   Sodium 138  135 - 145 mEq/L   Potassium 4.2  3.5 - 5.1 mEq/L   Chloride 103  96 - 112 mEq/L   CO2 24  19 - 32 mEq/L   Glucose, Bld 123 (*) 70 - 99 mg/dL   BUN 24 (*) 6 - 23 mg/dL   Creatinine, Ser 1.19  0.50 - 1.35 mg/dL   Calcium 9.5  8.4 - 14.7 mg/dL   GFR calc non Af Amer 78 (*) >90 mL/min   GFR calc Af Amer >90  >90 mL/min   Comment:  The eGFR has been calculated     using the CKD EPI equation.     This calculation has not been     validated in all clinical     situations.     eGFR's persistently     <90 mL/min signify     possible Chronic Kidney Disease.  CBC WITH DIFFERENTIAL     Status: Abnormal   Collection Time    03/19/13  3:34 PM      Result Value Range   WBC 15.1 (*) 4.0 - 10.5 K/uL   RBC 4.78  4.22 - 5.81 MIL/uL   Hemoglobin 15.5  13.0 - 17.0 g/dL   HCT 04.5  40.9 - 81.1 %   MCV 94.4  78.0 - 100.0 fL   MCH 32.4  26.0 - 34.0 pg   MCHC 34.4  30.0 - 36.0 g/dL   RDW 91.4  78.2 - 95.6 %   Platelets 261  150 - 400 K/uL   Neutrophils Relative % 76  43 - 77 %   Lymphocytes Relative 12  12 - 46 %   Monocytes Relative 11  3 - 12 %   Eosinophils Relative 1  0 - 5 %   Basophils Relative 0  0 - 1 %   Neutro Abs 11.4 (*) 1.7 - 7.7 K/uL   Lymphs Abs 1.8  0.7 - 4.0 K/uL   Monocytes Absolute 1.7 (*) 0.1 - 1.0 K/uL   Eosinophils Absolute 0.2  0.0 - 0.7 K/uL   Basophils Absolute 0.0  0.0 - 0.1 K/uL   WBC Morphology FEW ATYPICAL LYMPHS NOTED           Assessment/Plan: 1. Functional deficits secondary to Left MCA distribution infarct which require 3+ hours per day of interdisciplinary therapy in a comprehensive inpatient rehab setting. Physiatrist is providing close team supervision and 24 hour management of active medical problems listed below. Physiatrist and rehab team continue to assess barriers to discharge/monitor patient progress toward functional and medical goals.  FIM: FIM - Bathing Bathing Steps Patient Completed: Chest;Left Arm;Abdomen;Right upper leg;Left upper leg Bathing: 3: Mod-Patient completes 5-7 10f 10 parts or 50-74%  FIM - Upper Body Dressing/Undressing Upper body dressing/undressing steps patient completed: Thread/unthread left sleeve of pullover shirt/dress;Put head through opening of pull over shirt/dress Upper body dressing/undressing: 3: Mod-Patient completed 50-74% of tasks FIM - Lower Body Dressing/Undressing Lower body dressing/undressing steps patient completed: Thread/unthread left pants leg Lower body dressing/undressing: 1: Two helpers  FIM - Toileting Toileting: 1: Two helpers  FIM - Diplomatic Services operational officer Devices: Bedside commode (drop arm) Toilet Transfers: 2-To toilet/BSC: Max A (lift and lower assist);2-From toilet/BSC: Max A (lift and lower assist);1-Two helpers  FIM - Banker Devices: Arm rests;Bed rails Bed/Chair Transfer: 2: Supine > Sit: Max A (lifting assist/Pt. 25-49%);3: Sit > Supine: Mod A (lifting assist/Pt. 50-74%/lift 2 legs);2: Bed > Chair or W/C: Max A (lift and lower assist);2: Chair or W/C > Bed: Max A (lift and lower assist)  FIM - Locomotion: Wheelchair Distance: 150 Locomotion: Wheelchair: 2: Travels 150 ft or more: maneuvers on rugs and over door sills with maximal assistance (Pt: 25 - 49%) FIM - Locomotion: Ambulation Ambulation/Gait Assistance: Not tested (comment) Locomotion: Ambulation: 0: Activity  did not occur  Comprehension Comprehension Mode: Auditory Comprehension: 3-Understands basic 50 - 74% of the time/requires cueing 25 - 50%  of the time  Expression Expression Mode: Verbal Expression: 1-Expresses basis less than 25% of the time/requires  cueing greater than 75% of the time.  Social Interaction Social Interaction: 2-Interacts appropriately 25 - 49% of time - Needs frequent redirection.  Problem Solving Problem Solving: 2-Solves basic 25 - 49% of the time - needs direction more than half the time to initiate, plan or complete simple activities  Memory Memory: 3-Recognizes or recalls 50 - 74% of the time/requires cueing 25 - 49% of the time  Medical Problem List and Plan:  1. 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 DVT Prophylaxis/Anticoagulation: Eliquis  3. Pain Management: Tylenol as needed  4. Neuropsych: This patient is not capable of making decisions on his/her own behalf.  5. Dysphasia. Dysphagia 2 nectar thick liquids. Monitor for any signs of aspiration. Followup speech therapy  -encourage adequate PO intake  6. PAF/CAD/ CABG. Cardiac rate controlled. Continue Eliquis as directed. Followup cardiology services as needed  7. Hyperlipidemia. Crestor  8. Glaucoma. Continue eyedrops  9. Irritable bowel syndrome.Amitiza twice a day and MiraLAX daily.  -acute service to give bowel prep before transfer? 10. UTI urinalysis + switch to amoxicillin  LOS (Days) 8 A FACE TO FACE EVALUATION WAS PERFORMED  Aliece Honold E 03/21/2013, 9:06 AM

## 2013-03-22 ENCOUNTER — Inpatient Hospital Stay (HOSPITAL_COMMUNITY): Payer: Medicare Other | Admitting: *Deleted

## 2013-03-22 NOTE — Progress Notes (Signed)
Patient ID: Frank Weeks, male   DOB: 18-Jun-1929, 79 y.o.   MRN: [id number] Subjective/Complaints: 79 y.o. right-handed male with history of CAD/CABG, PAF pacemaker, subarachnoid hemorrhage 50 years ago CVA. Admitted 03/08/2013 with fall reported by wife with right-sided weakness and slurred speech. Cranial CT showed developing low density left basal ganglia external capsule region consistent with acute infarct. Carotid Dopplers with no ICA stenosis. Echocardiogram with ejection fraction of 60% and normal systolic function. CT angiogram of the head showed a filling defect throughout the left M1 segment compatible with embolus causing acute infarct in left basal ganglia.  Appears brighter this morning but  with poor appetite this morning. Was eating 75% of his meals on 6/6 but not all meals were recorded yesterday Psych not able to eval r/t aphasia   Review of Systems  Unable to perform ROS: medical condition   Objective: Vital Signs: Blood pressure 133/88, pulse 69, temperature 97.7 F (36.5 C), temperature source Oral, resp. rate 18, height 5\' 8"  (1.727 m), weight 73 kg (160 lb 15 oz), SpO2 97.00%. No results found. Results for orders placed during the hospital encounter of 03/13/13 (from the past 72 hour(s))  BASIC METABOLIC PANEL     Status: Abnormal   Collection Time    03/19/13  3:34 PM      Result Value Range   Sodium 138  135 - 145 mEq/L   Potassium 4.2  3.5 - 5.1 mEq/L   Chloride 103  96 - 112 mEq/L   CO2 24  19 - 32 mEq/L   Glucose, Bld 123 (*) 70 - 99 mg/dL   BUN 24 (*) 6 - 23 mg/dL   Creatinine, Ser 9.14  0.50 - 1.35 mg/dL   Calcium 9.5  8.4 - 78.2 mg/dL   GFR calc non Af Amer 78 (*) >90 mL/min   GFR calc Af Amer >90  >90 mL/min   Comment:            The eGFR has been calculated     using the CKD EPI equation.     This calculation has not been     validated in all clinical     situations.     eGFR's persistently     <90 mL/min signify     possible Chronic Kidney  Disease.  CBC WITH DIFFERENTIAL     Status: Abnormal   Collection Time    03/19/13  3:34 PM      Result Value Range   WBC 15.1 (*) 4.0 - 10.5 K/uL   RBC 4.78  4.22 - 5.81 MIL/uL   Hemoglobin 15.5  13.0 - 17.0 g/dL   HCT 95.6  21.3 - 08.6 %   MCV 94.4  78.0 - 100.0 fL   MCH 32.4  26.0 - 34.0 pg   MCHC 34.4  30.0 - 36.0 g/dL   RDW 57.8  46.9 - 62.9 %   Platelets 261  150 - 400 K/uL   Neutrophils Relative % 76  43 - 77 %   Lymphocytes Relative 12  12 - 46 %   Monocytes Relative 11  3 - 12 %   Eosinophils Relative 1  0 - 5 %   Basophils Relative 0  0 - 1 %   Neutro Abs 11.4 (*) 1.7 - 7.7 K/uL   Lymphs Abs 1.8  0.7 - 4.0 K/uL   Monocytes Absolute 1.7 (*) 0.1 - 1.0 K/uL   Eosinophils Absolute 0.2  0.0 - 0.7 K/uL  Basophils Absolute 0.0  0.0 - 0.1 K/uL   WBC Morphology FEW ATYPICAL LYMPHS NOTED          Assessment/Plan: 1. Functional deficits secondary to Left MCA distribution infarct which require 3+ hours per day of interdisciplinary therapy in a comprehensive inpatient rehab setting. Physiatrist is providing close team supervision and 24 hour management of active medical problems listed below. Physiatrist and rehab team continue to assess barriers to discharge/monitor patient progress toward functional and medical goals.  FIM: FIM - Bathing Bathing Steps Patient Completed: Chest;Left Arm;Abdomen;Right upper leg;Left upper leg Bathing: 3: Mod-Patient completes 5-7 74f 10 parts or 50-74%  FIM - Upper Body Dressing/Undressing Upper body dressing/undressing steps patient completed: Thread/unthread left sleeve of pullover shirt/dress;Put head through opening of pull over shirt/dress;Pull shirt over trunk Upper body dressing/undressing: 3: Mod-Patient completed 50-74% of tasks FIM - Lower Body Dressing/Undressing Lower body dressing/undressing steps patient completed: Thread/unthread left pants leg Lower body dressing/undressing: 1: Total-Patient completed less than 25% of  tasks  FIM - Toileting Toileting: 1: Two helpers  FIM - Diplomatic Services operational officer Devices: Bedside commode (drop arm) Toilet Transfers: 0-Activity did not occur  FIM - Banker Devices: Arm rests;Bed rails Bed/Chair Transfer: 2: Supine > Sit: Max A (lifting assist/Pt. 25-49%);1: Two helpers  FIM - Locomotion: Wheelchair Distance: 150 Locomotion: Wheelchair: 2: Travels 150 ft or more: maneuvers on rugs and over door sills with maximal assistance (Pt: 25 - 49%) FIM - Locomotion: Ambulation Ambulation/Gait Assistance: Not tested (comment) Locomotion: Ambulation: 0: Activity did not occur  Comprehension Comprehension Mode: Auditory Comprehension: 3-Understands basic 50 - 74% of the time/requires cueing 25 - 50%  of the time  Expression Expression Mode: Verbal Expression: 1-Expresses basis less than 25% of the time/requires cueing greater than 75% of the time.  Social Interaction Social Interaction: 2-Interacts appropriately 25 - 49% of time - Needs frequent redirection.  Problem Solving Problem Solving: 2-Solves basic 25 - 49% of the time - needs direction more than half the time to initiate, plan or complete simple activities  Memory Memory: 3-Recognizes or recalls 50 - 74% of the time/requires cueing 25 - 49% of the time  Medical Problem List and Plan:  1. 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 DVT Prophylaxis/Anticoagulation: Eliquis  3. Pain Management: Tylenol as needed  4. Neuropsych: This patient is not capable of making decisions on his/her own behalf.  5. Dysphasia. Dysphagia 2 nectar thick liquids. Monitor for any signs of aspiration. Followup speech therapy  -encourage adequate PO intake  6. PAF/CAD/ CABG. Cardiac rate controlled. Continue Eliquis as directed. Followup cardiology services as needed  7.  Hyperlipidemia. Crestor  8. Glaucoma. Continue eyedrops  9. Irritable bowel syndrome.Amitiza twice a day and MiraLAX daily.  -acute service to give bowel prep before transfer? 10. UTI urinalysis + switch to amoxicillin  LOS (Days) 9 A FACE TO FACE EVALUATION WAS PERFORMED  Dalila Arca E 03/22/2013, 10:33 AM

## 2013-03-22 NOTE — Progress Notes (Signed)
Physical Therapy Note  Patient Details  Name: Frank Weeks MRN: [id number] Date of Birth: [birth year] Today's Date: 03/22/2013  4098-1191 (55 minutes) individual Pain: no reported pain Focus of treatment: Neuro re-ed RT LE in gravity eliminated position; gait training with appropriate assistive device Treatment: Rolling min assist; supine to sit from right max assist; pt requires min assist to attain initial sitting balance on soft surface; transfers stand/turn max assist +1; sit to supine mod assist; Neuro re-ed RT LE using suspension grid (gravity eliminated) hip flexion (improved active range noted today) using cone as target; supine hip abduction; gait - sit to stand mod assist; Carley Hammed walker with ace wrap/ blue bootie Rt ankle 10 feet X 2 with +2 assist to guide AD ,  to complete swing on right (pt able to inconsistently initiate swing ) and to facilitate weight shifts; gait 12 feet X 1 with RT PFRW as above. Quick release belt applied and returned to nurses station.     Stanly Si,JIM 03/22/2013, 9:22 AM

## 2013-03-22 NOTE — Progress Notes (Signed)
Occupational Therapy Note  Patient Details  Name: JAHBARI REPINSKI MRN: [id number] Date of Birth: 1929/04/05 Today's Date: 03/22/2013 Time:  1430-1530  (60 min) Individual session Pain:  None  Engaged in transfers, sit to stand, dynamic sitting balance, RUE NMRE, standing balance in static position.  Pt. Was max assist from supine to EOB.  Pt. Transferred from bed to wc with max assist with strong pushing with LUE when going to left.  Addressed dynamic sitting balance using rotational movements and reaching with LUE.  Positioned RUE in weight bearing positions with manual cues to sit upright and maintain midline position.  Practiced weight shifting to right hip with max cues and mod facilitation.  Did standing balance for 1 minute with cues to contract quads for LE stabilization and weight bearing.  Wife, and daughter present during session and they reported they could see improvements   Humberto Seals 03/22/2013, 3:31 PM

## 2013-03-23 ENCOUNTER — Inpatient Hospital Stay (HOSPITAL_COMMUNITY): Payer: Medicare Other | Admitting: Speech Pathology

## 2013-03-23 ENCOUNTER — Inpatient Hospital Stay (HOSPITAL_COMMUNITY): Payer: Medicare Other | Admitting: Occupational Therapy

## 2013-03-23 ENCOUNTER — Encounter (HOSPITAL_COMMUNITY): Payer: Medicare Other

## 2013-03-23 ENCOUNTER — Inpatient Hospital Stay (HOSPITAL_COMMUNITY): Payer: Medicare Other | Admitting: Physical Therapy

## 2013-03-23 DIAGNOSIS — I4891 Unspecified atrial fibrillation: Secondary | ICD-10-CM

## 2013-03-23 DIAGNOSIS — I6340 Cerebral infarction due to embolism of unspecified cerebral artery: Secondary | ICD-10-CM

## 2013-03-23 DIAGNOSIS — G8110 Spastic hemiplegia affecting unspecified side: Secondary | ICD-10-CM

## 2013-03-23 NOTE — Progress Notes (Signed)
NUTRITION FOLLOW UP  DOCUMENTATION CODES  Per approved criteria   -Severe malnutrition in the context of acute illness or injury    Intervention:   1. Continue Ensure Pudding PO QID.  2. Continue Magic Cup with meals.  3. Discontinue Resource Breeze po BID, each supplement provides 250 kcal and 9 grams of protein. 4. RD to continue to follow nutrition care plan.  Nutrition Dx:   Inadequate oral intake related to poor appetite as evidenced by family report. Improving.  Goal:   Intake to meet >90% of estimated nutrition needs. Met.  Monitor:   weight trends, lab trends, I/O's, PO intake, supplement tolerance  Assessment:   PMHx of CAD/CABG, CVA 50 years ago. Admitted 5/25 s/p fall with acute infarct. Placed on Dysphagia 2 diet with Nectar Liquids by SLP.   Per chart review and RN, pt's oral intake has improved over the weekend. RN notes difficulty with thickening Resource Breeze - will d/c at this time. Oral intake has improved and pt will take current scheduled Ensure Pudding.  Pt meets criteria for severe MALNUTRITION in the context of acute illness as evidenced by 3% wt loss x 1 week and intake of <50% x at least 5 days.  Height: Ht Readings from Last 1 Encounters:  03/13/13 5\' 8"  (1.727 m)    Weight Status:   Wt Readings from Last 1 Encounters:  03/21/13 160 lb 15 oz (73 kg)  wt stable  Re-estimated needs:  Kcal: 1650 - 1800 Protein: 70 - 80 grams Fluid: 1.8 - 2 liters daily  Skin: intact  Diet Order: Dysphagia 2; Nectar Thickened Liquids   Intake/Output Summary (Last 24 hours) at 03/23/13 1427 Last data filed at 03/23/13 0900  Gross per 24 hour  Intake   1200 ml  Output      0 ml  Net   1200 ml    Last BM: 6/8   Labs:   Recent Labs Lab 03/19/13 1534  NA 138  K 4.2  CL 103  CO2 24  BUN 24*  CREATININE 0.87  CALCIUM 9.5  GLUCOSE 123*    CBG (last 3)  No results found for this basename: GLUCAP,  in the last 72 hours  Scheduled Meds: .  amoxicillin  500 mg Oral Q12H  . antiseptic oral rinse  15 mL Mouth Rinse q12n4p  . apixaban  5 mg Oral BID  . feeding supplement  1 Container Oral QID  . feeding supplement  1 Container Oral BID BM  . lubiprostone  24 mcg Oral BID WC  . methylphenidate  10 mg Oral BID WC  . multivitamin with minerals  1 tablet Oral Daily  . nystatin  5 mL Oral QID  . rosuvastatin  10 mg Oral q1800  . Travoprost (BAK Free)  1 drop Both Eyes QHS    Continuous Infusions: . sodium chloride 75 mL/hr at 03/23/13 0657    Jarold Motto MS, RD, LDN Pager: [phone] After-hours pager: [phone]

## 2013-03-23 NOTE — Progress Notes (Signed)
Patient ID: Frank Weeks, male   DOB: 1928-12-02, 79 y.o.   MRN: [id number] Subjective/Complaints: 79 y.o. right-handed male with history of CAD/CABG, PAF pacemaker, subarachnoid hemorrhage 50 years ago CVA. Admitted 03/08/2013 with fall reported by wife with right-sided weakness and slurred speech. Cranial CT showed developing low density left basal ganglia external capsule region consistent with acute infarct. Carotid Dopplers with no ICA stenosis. Echocardiogram with ejection fraction of 60% and normal systolic function. CT angiogram of the head showed a filling defect throughout the left M1 segment compatible with embolus causing acute infarct in left basal ganglia.  Appears brighter this morning Good appetite Psych not able to eval r/t aphasia   Review of Systems  Unable to perform ROS: medical condition   Objective: Vital Signs: Blood pressure 121/77, pulse 69, temperature 97.4 F (36.3 C), temperature source Oral, resp. rate 16, height 5\' 8"  (1.727 m), weight 73 kg (160 lb 15 oz), SpO2 96.00%. No results found. No results found for this or any previous visit (from the past 72 hour(s)).      Assessment/Plan: 1. Functional deficits secondary to Left MCA distribution infarct which require 3+ hours per day of interdisciplinary therapy in a comprehensive inpatient rehab setting. Physiatrist is providing close team supervision and 24 hour management of active medical problems listed below. Physiatrist and rehab team continue to assess barriers to discharge/monitor patient progress toward functional and medical goals.  FIM: FIM - Bathing Bathing Steps Patient Completed: Chest;Left Arm;Abdomen;Right upper leg;Left upper leg Bathing: 3: Mod-Patient completes 5-7 39f 10 parts or 50-74%  FIM - Upper Body Dressing/Undressing Upper body dressing/undressing steps patient completed: Thread/unthread left sleeve of pullover shirt/dress;Put head through opening of pull over shirt/dress;Pull shirt  over trunk Upper body dressing/undressing: 3: Mod-Patient completed 50-74% of tasks FIM - Lower Body Dressing/Undressing Lower body dressing/undressing steps patient completed: Thread/unthread left pants leg Lower body dressing/undressing: 1: Total-Patient completed less than 25% of tasks  FIM - Toileting Toileting: 1: Two helpers  FIM - Diplomatic Services operational officer Devices: Bedside commode (drop arm) Toilet Transfers: 0-Activity did not occur  FIM - Banker Devices: Arm rests;Bed rails Bed/Chair Transfer: 2: Supine > Sit: Max A (lifting assist/Pt. 25-49%);1: Two helpers  FIM - Locomotion: Wheelchair Distance: 150 Locomotion: Wheelchair: 2: Travels 150 ft or more: maneuvers on rugs and over door sills with maximal assistance (Pt: 25 - 49%) FIM - Locomotion: Ambulation Ambulation/Gait Assistance: Not tested (comment) Locomotion: Ambulation: 0: Activity did not occur  Comprehension Comprehension Mode: Auditory Comprehension: 3-Understands basic 50 - 74% of the time/requires cueing 25 - 50%  of the time  Expression Expression Mode: Verbal Expression: 1-Expresses basis less than 25% of the time/requires cueing greater than 75% of the time.  Social Interaction Social Interaction: 2-Interacts appropriately 25 - 49% of time - Needs frequent redirection.  Problem Solving Problem Solving: 2-Solves basic 25 - 49% of the time - needs direction more than half the time to initiate, plan or complete simple activities  Memory Memory: 3-Recognizes or recalls 50 - 74% of the time/requires cueing 25 - 49% of the time  Medical Problem List and Plan:  1. 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 DVT Prophylaxis/Anticoagulation: Eliquis  3. Pain Management: Tylenol as needed  4. Neuropsych: This patient is not capable of making decisions on  his/her own behalf.  5. Dysphasia. Dysphagia 2 nectar thick liquids. Monitor  for any signs of aspiration. Followup speech therapy  -encourage adequate PO intake  6. PAF/CAD/ CABG. Cardiac rate controlled. Continue Eliquis as directed. Followup cardiology services as needed  7. Hyperlipidemia. Crestor  8. Glaucoma. Continue eyedrops  9. Irritable bowel syndrome.Amitiza twice a day and MiraLAX daily.  -acute service to give bowel prep before transfer? 10. UTI urinalysis + switch to amoxicillin  LOS (Days) 10 A FACE TO FACE EVALUATION WAS PERFORMED  Dianna Ewald E 03/23/2013, 9:25 AM

## 2013-03-23 NOTE — Progress Notes (Signed)
Occupational Therapy Session Note  Patient Details  Name: Frank Weeks MRN: [id number] Date of Birth: 06-09-1929  Today's Date: 03/23/2013 Time: 0930-1030 Time Calculation (min): 60 min  Short Term Goals: Week 2:  OT Short Term Goal 1 (Week 2): Pt will sit EOB for 2 mins with supervision during self-care task  OT Short Term Goal 2 (Week 2): Pt will complete UB dressing with min assist OT Short Term Goal 3 (Week 2): Pt will complete LB bathing with mod assist OT Short Term Goal 4 (Week 2): Pt will complete LB dressing with max assist OT Short Term Goal 5 (Week 2): Pt will complete toileting with max assist of one caregiver  Skilled Therapeutic Interventions/Progress Updates:    Pt seen for ADL retraining with focus on squat pivot transfers, sit <> stand, trunk control, and increased participation in self-care tasks of bathing and dressing. Engaged in perineal hygiene in bed with focus on rolling Rt and Lt with supervision when rolling to Rt and mod facilitation when rolling to Lt.  Squat pivot transfer to drop arm BSC with pt reaching for arm rest to assist in transfer, pt voided on toilet.  +2 for clothing management and repositioning of w/c to increase safety of transfer.  Engaged in bathing at sink with focus on weight shifting and trunk control when washing BLE.  Sit to stand at sink with pt initiating pulling up pants with LUE while this clinician provided manual facilitation at hips to promote upright standing.  Provided pt with arm trough to increase positioning of RUE when sitting in w/c.  Therapy Documentation Precautions:  Precautions Precautions: Fall Precaution Comments: expressive aphasia Restrictions Weight Bearing Restrictions: No Pain: Pain Assessment Pain Assessment: No/denies pain  See FIM for current functional status  Therapy/Group: Individual Therapy  Leonette Monarch 03/23/2013, 12:00 PM

## 2013-03-23 NOTE — Progress Notes (Signed)
Physical Therapy Session Note  Patient Details  Name: Frank Weeks MRN: [id number] Date of Birth: 29-Apr-1929  Today's Date: 03/23/2013 Time: 1452-1550 Time Calculation (min): 58 min  Short Term Goals: Week 1:  PT Short Term Goal 1 (Week 1): patient will be able to perform bed mobility with mod-Assist. PT Short Term Goal 1 - Progress (Week 1): Not met PT Short Term Goal 2 (Week 1): Patient will be able to perform transfers with Mod-Assist. PT Short Term Goal 2 - Progress (Week 1): Not met PT Short Term Goal 3 (Week 1): Patient will be able to ambulate 10' using LRAD with Max-Assist  PT Short Term Goal 3 - Progress (Week 1): Not progressing PT Short Term Goal 4 (Week 1): Patient will be able to maintain dynamic sitting balance with Mod-assist PT Short Term Goal 4 - Progress (Week 1): Met  Skilled Therapeutic Interventions/Progress Updates:   Pt napping in bed; pt does like that he has a nap during the afternoon.  Upon awakening patient removed large amount of chewed up food from his mouth left over from lunch.  Discussed with RN that patient is at high aspiration risk and will need the R side of his mouth checked and oral hygiene performed prior to sleeping.  RN agreed.  Performed supine > sit max A with improved initiation for L lateral weight shift once upright.  Performed squat pivot to w/c with max A but with mod cues for sequencing and initiation.  Pt performed oral hygiene at sink from w/c with max cues for sequencing task and intermittent hand over hand to sequence holding tooth brush to reach R side of mouth.  Performed w/c mobility x 150' with LLE propulsion with intermittent min A for navigation around obstacles.  Performed gait training x 2 reps x 25' with LUE support on wall rail with therapist providing max A under RUE to facilitate upright trunk posture, lateral weight shifting, assistance to fully advance and place RLE, assistance for full anterior weight shift onto RLE and tactile cues  for activation of RLE extensors for stability in stance.  Verbal cues also given for increased step length LLE for step through gait pattern.  Performed stair negotiation with +2 A up and down 3 stairs with LUE support on rail and one therapist under RUE assisting with upright trunk posture, lateral and anterior weight shifting, RLE advancement, placement and stabilization during stance; trace quad noted in stance during stairs.  Returned to room; pt to sit up for supper and then return to bed.   Therapy Documentation Precautions:  Precautions Precautions: Fall Precaution Comments: expressive aphasia Restrictions Weight Bearing Restrictions: No Vital Signs: Therapy Vitals Temp: 97.5 F (36.4 C) Temp src: Oral Pulse Rate: 70 Resp: 18 BP: 116/67 mmHg Patient Position, if appropriate: Sitting Oxygen Therapy SpO2: 98 % O2 Device: None (Room air) Pain: Pain Assessment Pain Assessment: No/denies pain Locomotion : Ambulation Ambulation/Gait Assistance: 2: Max Lawyer Distance: 150   See FIM for current functional status  Therapy/Group: Individual Therapy  [name] [facility] 03/23/2013, 5:01 PM

## 2013-03-23 NOTE — Progress Notes (Signed)
Speech Language Pathology Daily Session Note  Patient Details  Name: Frank Weeks MRN: [id number] Date of Birth: [birth year]  Today's Date: 03/23/2013 Time: 0865-7846 Time Calculation (min): 45 min  Short Term Goals: Week 2: SLP Short Term Goal 1 (Week 2): Patient will perform oral motor and pharngeal strengthening exercises with Mod assist multimodal cueing  SLP Short Term Goal 2 (Week 2): Patient will answer yes/no questions with 90% accuracy. SLP Short Term Goal 3 (Week 2): Patient will sustain attention to basic, familiar tasks for 10 minutes with Supervision level verbal cues. SLP Short Term Goal 4 (Week 2): Patient will name basic ADL objects with Max assist semantic and phonemic cues. SLP Short Term Goal 5 (Week 2): Patient will self-monitor and correct errors with Max assist clinician cues.  Skilled Therapeutic Interventions: Skilled treatment session focused on addressing cognitive-linguistic and dysaphagia goals.  Patient accurately named 5/10 basic ALDs objects with Supervision cues to repeat.  Patient verbally ersponded to yes/no question accurately in 12/15 opportunities.  SLP also facilitated session with Max assist verbal and visual cues to perform oral motor, pharyngeal strengthening and diaphragmatic breathing exercises.  Patient required Max encouragment to verbally communciate through out session    FIM:  Comprehension Comprehension Mode: Auditory Comprehension: 3-Understands basic 50 - 74% of the time/requires cueing 25 - 50%  of the time Expression Expression Mode: Verbal Expression: 1-Expresses basis less than 25% of the time/requires cueing greater than 75% of the time. Social Interaction Social Interaction: 2-Interacts appropriately 25 - 49% of time - Needs frequent redirection. Problem Solving Problem Solving: 2-Solves basic 25 - 49% of the time - needs direction more than half the time to initiate, plan or complete simple activities Memory Memory: 3-Recognizes or  recalls 50 - 74% of the time/requires cueing 25 - 49% of the time  Pain Pain Assessment Pain Assessment: No/denies pain  Therapy/Group: Individual Therapy  Charlane Ferretti., CCC-SLP [phone]  Sakinah Rosamond 03/23/2013, 11:36 AM

## 2013-03-24 ENCOUNTER — Inpatient Hospital Stay (HOSPITAL_COMMUNITY): Payer: Medicare Other | Admitting: Speech Pathology

## 2013-03-24 ENCOUNTER — Inpatient Hospital Stay (HOSPITAL_COMMUNITY): Payer: Medicare Other | Admitting: Physical Therapy

## 2013-03-24 ENCOUNTER — Inpatient Hospital Stay (HOSPITAL_COMMUNITY): Payer: Medicare Other | Admitting: Occupational Therapy

## 2013-03-24 NOTE — Progress Notes (Signed)
Occupational Therapy Note  Patient Details  Name: Frank Weeks MRN: [id number] Date of Birth: 09-Mar-1929 Today's Date: 03/24/2013  Time:1200-1215(cotx with Speech therapy-total time 4782-9562) Pt denies pain Group Therapy  Pt participated in self feeding group with focus on BUE use for set up and self feeding, task initiation, and attention to task.  Pt required min verbal cues for portion control and adhering to swallowing precautions.  Pt was supervision for all self feeding tasks.   Lavone Neri [facility] 03/24/2013, 12:49 PM

## 2013-03-24 NOTE — Progress Notes (Signed)
Speech Language Pathology Daily Session Note  Patient Details  Name: NIALL ILLES MRN: [id number] Date of Birth: 1929/10/07  Today's Date: 03/24/2013 Time: 1130-1200 Time Calculation (min): 30 min  Short Term Goals: Week 2: SLP Short Term Goal 1 (Week 2): Patient will perform oral motor and pharngeal strengthening exercises with Mod assist multimodal cueing  SLP Short Term Goal 2 (Week 2): Patient will answer yes/no questions with 90% accuracy. SLP Short Term Goal 3 (Week 2): Patient will sustain attention to basic, familiar tasks for 10 minutes with Supervision level verbal cues. SLP Short Term Goal 4 (Week 2): Patient will name basic ADL objects with Max assist semantic and phonemic cues. SLP Short Term Goal 5 (Week 2): Patient will self-monitor and correct errors with Max assist clinician cues.  Skilled Therapeutic Interventions: Pt participated in co-treatment with OT with focus on self-feeding, dysphagia and cognitive-linguistic goals. Pt consumed current diet without overt s/s of aspiration but required min verbal cues for utilization of small bites and to self-monitor and correct right anterior spillage. Pt will little social interaction and verbal expression throughout the session.    FIM:  Comprehension Comprehension Mode: Auditory Comprehension: 3-Understands basic 50 - 74% of the time/requires cueing 25 - 50%  of the time Expression Expression Mode: Verbal Expression: 1-Expresses basis less than 25% of the time/requires cueing greater than 75% of the time. Social Interaction Social Interaction: 2-Interacts appropriately 25 - 49% of time - Needs frequent redirection. Problem Solving Problem Solving: 2-Solves basic 25 - 49% of the time - needs direction more than half the time to initiate, plan or complete simple activities Memory Memory: 3-Recognizes or recalls 50 - 74% of the time/requires cueing 25 - 49% of the time FIM - Eating Eating Activity: 4: Helper checks for  pocketed food  Pain Pain Assessment Pain Assessment: No/denies pain  Therapy/Group: Group Therapy  Kyonna Frier 03/24/2013, 4:39 PM

## 2013-03-24 NOTE — Progress Notes (Signed)
Occupational Therapy Session Note  Patient Details  Name: Frank Weeks MRN: [id number] Date of Birth: November 30, 1928  Today's Date: 03/24/2013 Time: 0930-1030 and 1400-1430 (co-tx with PT 1400-1500) Time Calculation (min): 60 min and 30 min  Short Term Goals: Week 2:  OT Short Term Goal 1 (Week 2): Pt will sit EOB for 2 mins with supervision during self-care task  OT Short Term Goal 2 (Week 2): Pt will complete UB dressing with min assist OT Short Term Goal 3 (Week 2): Pt will complete LB bathing with mod assist OT Short Term Goal 4 (Week 2): Pt will complete LB dressing with max assist OT Short Term Goal 5 (Week 2): Pt will complete toileting with max assist of one caregiver  Skilled Therapeutic Interventions/Progress Updates:    1) Pt seen for ADL retraining with focus on squat pivot transfers, sit <> stand, trunk control, and increased participation in self-care tasks of bathing and dressing. Engaged in perineal hygiene in bed with focus on rolling Rt and Lt with supervision when rolling to Rt and mod facilitation when rolling to Lt. Squat pivot transfer to w/c with pt reaching for arm rest to assist in transfer. Engaged in bathing at sink with focus on weight shifting and trunk control when washing BLE. Sit to stand at sink with pt initiating pulling up pants with LUE while this clinician provided manual facilitation at hips to promote upright standing. Educated pt on self-ROM exercises and had pt return demonstrate.  2) Pt seen for co-tx with PT with focus on weight shifting during gait training.  Pt napping in bed upon arrival, reports brief wet and needing to be changed.  Engaged in bed mobility with rolling Rt and Lt, with manual facilitation at RLE to facilitate weight shift and roll to Lt.  Pt demonstrated increased bridging with support at RLE to pull up and down pants.  Squat pivot transfer to w/c with max assist with pt reaching for arm rest to assist with weight shift.  Engaged in gait  training with PT with PFRW with this clinician providing tactile cues and manual facilitation to elicit weight shift to promote stepping pattern.  Pt with decreased weight shift off of RLE, requiring physical assist to promote weight shift.  Verbal cues given to bump this therapist's hip to increase weight shift.  See PT note for further info on gait training.  Therapy Documentation Precautions:  Precautions Precautions: Fall Precaution Comments: expressive aphasia Restrictions Weight Bearing Restrictions: No Pain:  Pt with no c/o pain this session.  See FIM for current functional status  Therapy/Group: Individual Therapy and Co-Treatment  Leonette Monarch 03/24/2013, 12:23 PM

## 2013-03-24 NOTE — Consult Note (Signed)
NEUROBEHAVIORAL STATUS EXAM - CONFIDENTIAL  Inpatient Rehabilitation   MEDICAL NECESSITY:  [name] was seen on the [facility] for a neurobehavioral status exam owing to the patient's diagnosis of stroke, and to assist in treatment planning during admission.   [name] was previously seen by neuropsychologist Dr. Leavy Cella. However, he was unable to be evaluated given his mental state at the time owing to a UTI. As such, I was asked to assess the patient's cognitive and emotional functioning.   According to medical records, [name] was admitted on 03/08/13 following a fall with right-sided weakness and slurred speech. Cranial CT demonstrated developing low density left basal ganglia external capsule region acute infarct. He did not receive TPA. His history is also reportedly significant for prior stroke several years ago and aneurysm at age 65.   From a cognitive standpoint, Mr. and Mrs. Frank Weeks denied noticing any cognitive deficits beyond the communication issues he suffers.  He was able to convey sleeping trouble that he has been experiencing. This was reportedly mentioned to the physician assistant. Much of the session today was spent providing psychoeducation regarding timeline for recovery and how to reduce adverse emotional factors.   PROCEDURES ADMINISTERED: [2 units [id number] on 03/23/13] Diagnostic clinical interview  Review of available records Mental Status Exam-2 (brief version)  MENTAL STATUS: [name] mental status exam score was below the cutoff used to indicate severe cognitive impairment or dementia. Of note, significant language dysfunction adversely impacted his performance in this measure. Issues with both receptive and expressive aphasia were observed. Of note, his aphasia is nonfluent in nature, he was unable to repeat words accurately, but he seemed to comprehend most of the questions.   Emotional & Behavioral Evaluation: [name] was  appropriately dressed for season and situation, and he appeared tidy and well-groomed. Normal posture was noted. He was friendly and rapport was adequately established. His speech was quite aphasic - more issues with expression than understanding, though there were a lot of inconsistencies with even yes or no responding. His affect was flat. Attention and motivation were good. Optimal test taking conditions were maintained.  From an emotional standpoint, [name] endorsed experiencing depression and anxiety owing to his present physical state. He is hopeful about the future but impatient about getting better. Suicidal/homicidal ideation, plan or intent was denied. No manic or hypomanic episodes were reported. The patient denied ever experiencing any auditory/visual hallucinations. No major behavioral or personality changes were endorsed.    Overall, [name] seems to be experiencing the cognitive and emotional sequela of stroke. Comprehensive neuropsychological screen is not warranted at this time but may be in the future before discharge. In the meantime, Dr. Wylene Simmer should follow-up with the patient later this week to assess his mental status and provide ego support.   In light of these findings, the following recommendations are provided.    RECOMMENDATIONS  Recommendations for treatment team:    Since emotional factors are likely adversely impacting the patient's daily life, possibly implementing an antidepressant may be beneficial. Brief counseling for social support.    Dr. Wylene Simmer to re-evaluate mental status later this week.   DIAGNOSES:  Stroke syndrome  Adjustment disorder with depression and anxiety   Debbe Mounts, Psy.D.  Clinical Neuropsychologist

## 2013-03-24 NOTE — Progress Notes (Signed)
Speech Language Pathology Daily Session Note  Patient Details  Name: Frank Weeks MRN: [id number] Date of Birth: 1929/02/10  Today's Date: 03/24/2013 Time: 4098-1191 Time Calculation (min): 25 min  Short Term Goals: Week 2: SLP Short Term Goal 1 (Week 2): Patient will perform oral motor and pharngeal strengthening exercises with Mod assist multimodal cueing  SLP Short Term Goal 2 (Week 2): Patient will answer yes/no questions with 90% accuracy. SLP Short Term Goal 3 (Week 2): Patient will sustain attention to basic, familiar tasks for 10 minutes with Supervision level verbal cues. SLP Short Term Goal 4 (Week 2): Patient will name basic ADL objects with Max assist semantic and phonemic cues. SLP Short Term Goal 5 (Week 2): Patient will self-monitor and correct errors with Max assist clinician cues.  Skilled Therapeutic Interventions: Skilled treatment session focused on addressing cognitive-linguistic goals.  Patient accurately named 6/10 basic ALDs objects with Supervision cues to repeat due to decreased vocal intensity.  SLP also facilitated session with colored card which had 4 objects; patient was able to accurately identify the odd one out in 50% of opportunities.  Patient required Max encouragement to verbally communicate through out session.     FIM:  Comprehension Comprehension Mode: Auditory Comprehension: 3-Understands basic 50 - 74% of the time/requires cueing 25 - 50%  of the time Expression Expression Mode: Verbal Expression: 1-Expresses basis less than 25% of the time/requires cueing greater than 75% of the time. Social Interaction Social Interaction: 2-Interacts appropriately 25 - 49% of time - Needs frequent redirection. Problem Solving Problem Solving: 2-Solves basic 25 - 49% of the time - needs direction more than half the time to initiate, plan or complete simple activities Memory Memory: 3-Recognizes or recalls 50 - 74% of the time/requires cueing 25 - 49% of the  time  Pain Pain Assessment Pain Assessment: No/denies pain  Therapy/Group: Individual Therapy  Charlane Ferretti., CCC-SLP [phone]  Frank Weeks 03/24/2013, 3:33 PM

## 2013-03-24 NOTE — Progress Notes (Signed)
Physical Therapy Session Note  Patient Details  Name: Frank Weeks MRN: [id number] Date of Birth: 23-May-1929  Today's Date: 03/24/2013 Time: 1430 (full time 1400-1500; co treat with SPH)-1500 Time Calculation (min): 30 min  Short Term Goals: Week 1:  PT Short Term Goal 1 (Week 1): patient will be able to perform bed mobility with mod-Assist. PT Short Term Goal 1 - Progress (Week 1): Not met PT Short Term Goal 2 (Week 1): Patient will be able to perform transfers with Mod-Assist. PT Short Term Goal 2 - Progress (Week 1): Not met PT Short Term Goal 3 (Week 1): Patient will be able to ambulate 10' using LRAD with Max-Assist  PT Short Term Goal 3 - Progress (Week 1): Not progressing PT Short Term Goal 4 (Week 1): Patient will be able to maintain dynamic sitting balance with Mod-assist PT Short Term Goal 4 - Progress (Week 1): Met Week 2:  PT Short Term Goal 1 (Week 2): Patient will be able to perform bed mobility with mod-Assist. PT Short Term Goal 2 (Week 2): Patient will be able to perform transfers with Mod-Assist. PT Short Term Goal 3 (Week 2): Patient will be able to ambulate 10' using LRAD with Max-Assist  PT Short Term Goal 4 (Week 2): Patient will be able to maintain dynamic sitting balance with Min-assist PT Short Term Goal 5 (Week 2): Patient will participate in w/c mobility with hemi technique with max A x 25'  Skilled Therapeutic Interventions/Progress Updates:   Pt seen with OT for standing balance and gait training.  Pt performed gait training x 25' x 2 reps with R PFRW for UE support to promote upright trunk posture.  During first repetition OT assisted with safe management of RW and manual and verbal cues for L lateral weight shift to allow full RLE step length.  PT facilitated full step length RLE and tactile and verbal cues for upright trunk and full RLE hip and knee extension in stance for full LLE step length.  Pt reporting during rest break that he did not like being "pulled  over" by the OT.  During second repetition gave more simple verbal and visual cues and allowed pt extra time to initiate L lateral weight shift and RLE swing/advancement; during second repetition pt better able to maintain upright trunk and able to initiate L lateral weight shift with cue "bump her hip", also able to partially advance RLE but still presents with flexed hip and knee in stance.  Will continue to address.  Pt to remain up in w/c to visit with wife and grandson.     Therapy Documentation Precautions:  Precautions Precautions: Fall Precaution Comments: expressive aphasia Restrictions Weight Bearing Restrictions: No Vital Signs: Therapy Vitals Temp: 97.2 F (36.2 C) Temp src: Oral Pulse Rate: 69 Resp: 18 BP: 170/95 mmHg Oxygen Therapy SpO2: 99 % O2 Device: None (Room air) Pain: Pain Assessment Pain Assessment: No/denies pain Locomotion : Ambulation Ambulation/Gait Assistance: 1: +2 Total assist   See FIM for current functional status  Therapy/Group: Co-Treatment with OT  [name] Faucette 03/24/2013, 4:45 PM

## 2013-03-24 NOTE — Progress Notes (Signed)
Patient ID: Frank Weeks, male   DOB: [birth year], 79 y.o.   MRN: [id number] Subjective/Complaints: 79 y.o. right-handed male with history of CAD/CABG, PAF pacemaker, subarachnoid hemorrhage 50 years ago CVA. Admitted 03/08/2013 with fall reported by wife with right-sided weakness and slurred speech. Cranial CT showed developing low density left basal ganglia external capsule region consistent with acute infarct. Carotid Dopplers with no ICA stenosis. Echocardiogram with ejection fraction of 60% and normal systolic function. CT angiogram of the head showed a filling defect throughout the left M1 segment compatible with embolus causing acute infarct in left basal ganglia.  Appears brighter this morning Good appetite , smiling!   Review of Systems  Unable to perform ROS: medical condition   Objective: Vital Signs: Blood pressure 130/81, pulse 69, temperature 97.5 F (36.4 C), temperature source Oral, resp. rate 19, height 5\' 8"  (1.727 m), weight 73 kg (160 lb 15 oz), SpO2 97.00%. No results found. No results found for this or any previous visit (from the past 72 hour(s)).      Assessment/Plan: 1. Functional deficits secondary to Left MCA distribution infarct which require 3+ hours per day of interdisciplinary therapy in a comprehensive inpatient rehab setting. Physiatrist is providing close team supervision and 24 hour management of active medical problems listed below. Physiatrist and rehab team continue to assess barriers to discharge/monitor patient progress toward functional and medical goals.  FIM: FIM - Bathing Bathing Steps Patient Completed: Chest;Right Arm;Abdomen;Front perineal area;Right upper leg;Left upper leg Bathing: 3: Mod-Patient completes 5-7 14f 10 parts or 50-74%  FIM - Upper Body Dressing/Undressing Upper body dressing/undressing steps patient completed: Thread/unthread left sleeve of pullover shirt/dress;Put head through opening of pull over shirt/dress Upper body  dressing/undressing: 3: Mod-Patient completed 50-74% of tasks FIM - Lower Body Dressing/Undressing Lower body dressing/undressing steps patient completed: Thread/unthread left pants leg Lower body dressing/undressing: 1: Total-Patient completed less than 25% of tasks  FIM - Toileting Toileting steps completed by patient: Performs perineal hygiene Toileting: 1: Two helpers  FIM - Diplomatic Services operational officer Devices: Psychiatrist Transfers: 0-Activity did not occur  FIM - Banker Devices: Bed rails;Arm rests;HOB elevated Bed/Chair Transfer: 2: Supine > Sit: Max A (lifting assist/Pt. 25-49%);2: Sit > Supine: Max A (lifting assist/Pt. 25-49%);2: Bed > Chair or W/C: Max A (lift and lower assist);2: Chair or W/C > Bed: Max A (lift and lower assist)  FIM - Locomotion: Wheelchair Distance: 150 Locomotion: Wheelchair: 4: Travels 150 ft or more: maneuvers on rugs and over door sillls with minimal assistance (Pt.>75%) FIM - Locomotion: Ambulation Locomotion: Ambulation Assistive Devices: Other (comment);Orthosis (wall rail on L) Ambulation/Gait Assistance: 2: Max assist Locomotion: Ambulation: 1: Travels less than 50 ft with maximal assistance (Pt: 25 - 49%)  Comprehension Comprehension Mode: Auditory Comprehension: 3-Understands basic 50 - 74% of the time/requires cueing 25 - 50%  of the time  Expression Expression Mode: Verbal Expression: 1-Expresses basis less than 25% of the time/requires cueing greater than 75% of the time.  Social Interaction Social Interaction: 2-Interacts appropriately 25 - 49% of time - Needs frequent redirection.  Problem Solving Problem Solving: 2-Solves basic 25 - 49% of the time - needs direction more than half the time to initiate, plan or complete simple activities  Memory Memory: 3-Recognizes or recalls 50 - 74% of the time/requires cueing 25 - 49% of the time  Medical Problem List and  Plan:  1. Left basal ganglia, external capsule infarct felt to be embolic , poor  level of alertness, possible posterior of depression, trial Ritalin 2. DVT Prophylaxis/Anticoagulation: Eliquis  3. Pain Management: Tylenol as needed  4. Neuropsych: This patient is not capable of making decisions on his/her own behalf.  5. Dysphasia. Dysphagia 2 nectar thick liquids. Monitor for any signs of aspiration. Followup speech therapy  -encourage adequate PO intake  6. PAF/CAD/ CABG. Cardiac rate controlled. Continue Eliquis as directed. Followup cardiology services as needed  7. Hyperlipidemia. Crestor  8. Glaucoma. Continue eyedrops  9. Irritable bowel syndrome.Amitiza twice a day and MiraLAX daily.  -acute service to give bowel prep before transfer? 10. UTI urinalysis + switch to amoxicillin  LOS (Days) 11 A FACE TO FACE EVALUATION WAS PERFORMED  [name],[name] 03/24/2013, 10:05 AM

## 2013-03-25 ENCOUNTER — Inpatient Hospital Stay (HOSPITAL_COMMUNITY): Payer: Medicare Other | Admitting: Speech Pathology

## 2013-03-25 ENCOUNTER — Inpatient Hospital Stay (HOSPITAL_COMMUNITY): Payer: Medicare Other | Admitting: Occupational Therapy

## 2013-03-25 ENCOUNTER — Inpatient Hospital Stay (HOSPITAL_COMMUNITY): Payer: Medicare Other | Admitting: Physical Therapy

## 2013-03-25 NOTE — Progress Notes (Signed)
Occupational Therapy Note  Patient Details  Name: Frank Weeks MRN: [id number] Date of Birth: 10-13-1929 Today's Date: 03/25/2013  Time In: 1130  Time Out:  1220.  Individual session no c/o pain.  Treatment focused on self feeding with emphasis on scanning to the right, eating with non-dominant hand, small bites and sips using a straw, hygiene and monitoring of pocketing of food using a mirror.  Patient required min a cues for small sips, hygiene and to clear throat during meal.  Patient demonstrating good appetite and carry over of strategies.   Norton Pastel 03/25/2013, 2:55 PM

## 2013-03-25 NOTE — Progress Notes (Signed)
Pt had epistaxis upon going from lying to sitting position prior to PT. Took approximately five minutes for bleeding to stop. Deatra Ina PA notified, no new orders at this time. Hedy Camara

## 2013-03-25 NOTE — Patient Care Conference (Signed)
Inpatient RehabilitationTeam Conference and Plan of Care Update Date: 03/25/2013   Time: 10:40  AM    Patient Name: Frank Weeks      Medical Record Number: [id number]  Date of Birth: 10/26/1928 Sex: Male         Room/Bed: 4034/4034-01 Payor Info: Payor: [employer] [place] MEDICARE / Plan: BLUE MEDICARE / Product Type: *No Product type* /    Admitting Diagnosis: CVA  Admit Date/Time:  03/13/2013  4:09 PM Admission Comments: No comment available   Primary Diagnosis:  Embolic cerebral infarction Principal Problem: Embolic cerebral infarction  Patient Active Problem List   Diagnosis Date Noted  . Embolic cerebral infarction 03/16/2013  . Atrial flutter 03/11/2013  . Hemiparesis 03/08/2013  . Falls 06/17/2012  . Fatigue 06/12/2011  . Chest pain, non-cardiac   . Pacemaker-[employer]   . DIASTOLIC HEART FAILURE, ACUTE 40/98/1191  . Atrial fibrillation 11/04/2009  . DYSLIPIDEMIA 07/08/2009  . GLAUCOMA 07/08/2009  . HYPERTENSION, UNSPECIFIED 07/08/2009  . CAD, ARTERY BYPASS GRAFT 07/08/2009  . Subarachnoid hemorrhage 07/08/2009  . CEREBROVASCULAR DISEASE 07/08/2009  . PERIPHERAL VASCULAR DISEASE 07/08/2009  . PLEURAL EFFUSION 07/08/2009  . GASTROESOPHAGEAL REFLUX DISEASE 07/08/2009  . 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 SHORTNESS OF BREATH 03/03/2009    Expected Discharge Date: Expected Discharge Date: 04/08/13 (recommend SNF)  Team Members Present: Physician leading conference: Dr. Claudette Laws Social Worker Present: Amada Jupiter, LCSW Nurse Present: Laural Roes, RN PT Present: Wanda Plump, PT Clarisse Gouge Ripa, PT) OT Present: Bretta Bang, Verlene Mayer, OT [facility] [place], OT) SLP Present: Fae Pippin, SLP     Current Status/Progress Goal Weekly Team Focus  Medical   no significant neuro recovery, participating better  Maximize compensation  encourage particpation   Bowel/Bladder   incont. of bowel and bladder. Condom cath in use HS; pt does  not use call bell  continent with timed toileting  timed toileting and reienforce call bell use   Swallow/Nutrition/ Hydration   Dys.2 textures and Nectar-thick liquids  least restrictive p.o. intake  increase self-monitoring and carryover   ADL's   mod assist bathing, mod assist UB dressing, total assist LB dressing, max assist squat pivot transfer, +2 for hygiene with toileting  min assist overall  trunk control, transfers, weight shifting, decreasing assist with toileting and transfers   Mobility   Min A w/c mobility, max-total A transfers, bed mobility and gait  Min A w/c level; gait with therapy only Mod A  Balance, decreasing assistance for transfers, gait   Communication   Max assist  Min assist  increase self-monitoring and correcting of word level CVC expression   Safety/Cognition/ Behavioral Observations  Mod assist  Supervision-Min assist  cotinue to increase attention and recall   Pain   no complaints of pain  no c/o pain  monitor   Skin   bruises to arms and abdomen, Rash of flat bumps to lower abdomen and groin area  no new breakdown  montior and assess skin qshift      *See Care Plan and progress notes for long and short-term goals.  Barriers to Discharge: heavy physical assist    Possible Resolutions to Barriers:  consider SNF    Discharge Planning/Teaching Needs:  Wife here observing in therapies, pt more participatory feels better.  Wife wants to take home but unsure if realistic regarding how much care pt will require      Team Discussion:  Steady but slow progress with all therapies this week.  Better  participation overall but continues with decreased endurance.  Hope to be able to make some improvements in diet/ consistency and transfers, however, feel pt will definitely need to consider/ pursue SNF after CIR  Revisions to Treatment Plan:  None   Continued Need for Acute Rehabilitation Level of Care: The patient requires daily medical management by a  physician with specialized training in physical medicine and rehabilitation for the following conditions: Daily direction of a multidisciplinary physical rehabilitation program to ensure safe treatment while eliciting the highest outcome that is of practical value to the patient.: Yes Daily medical management of patient stability for increased activity during participation in an intensive rehabilitation regime.: Yes Daily analysis of laboratory values and/or radiology reports with any subsequent need for medication adjustment of medical intervention for : Neurological problems  Tona Qualley 03/25/2013, 4:00 PM

## 2013-03-25 NOTE — Progress Notes (Signed)
Physical Therapy Session Note  Patient Details  Name: Frank Weeks MRN: [id number] Date of Birth: 1928/10/23  Today's Date: 03/25/2013 Time: 4098-1191 Time Calculation (min): 60 min  Short Term Goals: Week 2:  PT Short Term Goal 1 (Week 2): Patient will be able to perform bed mobility with mod-Assist. PT Short Term Goal 2 (Week 2): Patient will be able to perform transfers with Mod-Assist. PT Short Term Goal 3 (Week 2): Patient will be able to ambulate 10' using LRAD with Max-Assist  PT Short Term Goal 4 (Week 2): Patient will be able to maintain dynamic sitting balance with Min-assist PT Short Term Goal 5 (Week 2): Patient will participate in w/c mobility with hemi technique with max A x 25'  Skilled Therapeutic Interventions/Progress Updates:    This session focused on bed mobility max assist to support trunk to get to sitting. Pt able to manage his legs to EOB independently.  Stand pivot into WC two person mod assist to his left or stronger side.  WC mobility mod assist to help pt steer with hemi technique.  Cues to use his left leg to steer (he seemed to only be able to use his left arm, not both arm and leg).  Gait with R PFRW two person assist mod-max (max with fatigue).  Verbal, tactile and manual cues for left weight shift, right leg progression and maneuvering RW.  Pt walked 35'.  Sitting balance D1/D2 pattern with green weighted ball and left upper extremity.  Up to mod assist LOB to the right and posteriorly.  Standing working on dynamic tasks with two person assist reaching to the left and taking horseshoe and putting it on maximally elevated b-ball goal. Pt working on upright posture, reaching outside of BOS and weight shift to the left.  Tactile and manual cues for hip ext, right knee extension weight shift left and upright posture.    Therapy Documentation Precautions:  Precautions Precautions: Fall Precaution Comments: expressive aphasia Restrictions Weight Bearing  Restrictions: No    Pain: Pain Assessment Pain Assessment: No/denies pain Pain Score: 0-No pain   Locomotion : Ambulation Ambulation/Gait Assistance: 1: +2 Total assist Wheelchair Mobility Distance: 150   See FIM for current functional status  Therapy/Group: Individual Therapy  Lurena Joiner B. Artist Bloom, PT, DPT [phone]   03/25/2013, 3:30 PM

## 2013-03-25 NOTE — Progress Notes (Signed)
Speech Language Pathology Daily Session Note  Patient Details  Name: Frank Weeks MRN: [id number] Date of Birth: 09/14/1929  Today's Date: 03/25/2013 Time: 1520-1610 Time Calculation (min): 50 min  Short Term Goals: Week 2: SLP Short Term Goal 1 (Week 2): Patient will perform oral motor and pharngeal strengthening exercises with Mod assist multimodal cueing  SLP Short Term Goal 2 (Week 2): Patient will answer yes/no questions with 90% accuracy. SLP Short Term Goal 3 (Week 2): Patient will sustain attention to basic, familiar tasks for 10 minutes with Supervision level verbal cues. SLP Short Term Goal 4 (Week 2): Patient will name basic ADL objects with Max assist semantic and phonemic cues. SLP Short Term Goal 5 (Week 2): Patient will self-monitor and correct errors with Max assist clinician cues.  Skilled Therapeutic Interventions: Skilled treatment session focused on addressing cognitive-linguistic goals and family education.  Patient accurately named 7/10 basic ALDs objects with Supervision cues to repeat due to decreased vocal intensity.  SLP also facilitated session with colored cards which had pictures of 4 objects per card; patient was able to accurately identify the odd one out in 60% of opportunities.  Patient required Mod encouragement to verbally communicate through out session.  Daughter present for session and asked appropriate questions about father's status; SLP educated her regarding dsyphagia, aphasia and apraxia. Daughter observed and demonstrated appropriate cuing techniques.      FIM:  Comprehension Comprehension Mode: Auditory Comprehension: 3-Understands basic 50 - 74% of the time/requires cueing 25 - 50%  of the time Expression Expression Mode: Verbal Expression: 1-Expresses basis less than 25% of the time/requires cueing greater than 75% of the time. Social Interaction Social Interaction: 2-Interacts appropriately 25 - 49% of time - Needs frequent  redirection. Problem Solving Problem Solving: 2-Solves basic 25 - 49% of the time - needs direction more than half the time to initiate, plan or complete simple activities Memory Memory: 3-Recognizes or recalls 50 - 74% of the time/requires cueing 25 - 49% of the time FIM - Eating Eating Activity: 4: Helper checks for pocketed food  Pain Pain Assessment Pain Assessment: No/denies pain Pain Score: 0-No pain  Therapy/Group: Individual Therapy  Charlane Ferretti., CCC-SLP [phone]  Frank Weeks 03/25/2013, 4:41 PM

## 2013-03-25 NOTE — Progress Notes (Signed)
NUTRITION FOLLOW UP  DOCUMENTATION CODES  Per approved criteria   -Severe malnutrition in the context of acute illness or injury    Intervention:   1. Continue Ensure Pudding PO QID.  2. Continue Magic Cup with meals.  3. RD to continue to follow nutrition care plan.  Nutrition Dx:   Inadequate oral intake related to poor appetite as evidenced by family report. Improving.  Goal:   Intake to meet >90% of estimated nutrition needs. Met.  Monitor:   weight trends, lab trends, I/O's, PO intake, supplement tolerance  Assessment:   PMHx of CAD/CABG, CVA 50 years ago. Admitted 5/25 s/p fall with acute infarct. Placed on Dysphagia 2 diet with Nectar Liquids by SLP.   Per chart review and nurse tech, pt's oral intake has remained improved. Continues on Dysphagia 2 diet with Nectar Thickened Liquids.  Pt meets criteria for severe MALNUTRITION in the context of acute illness as evidenced by 3% wt loss x 1 week and intake of <50% x at least 5 days.  Height: Ht Readings from Last 1 Encounters:  03/13/13 5\' 8"  (1.727 m)    Weight Status:   Wt Readings from Last 1 Encounters:  03/21/13 160 lb 15 oz (73 kg)  wt stable  Re-estimated needs:  Kcal: 1650 - 1800 Protein: 70 - 80 grams Fluid: 1.8 - 2 liters daily  Skin: intact  Diet Order: Dysphagia 2; Nectar Thickened Liquids   Intake/Output Summary (Last 24 hours) at 03/25/13 1209 Last data filed at 03/25/13 0900  Gross per 24 hour  Intake    600 ml  Output    300 ml  Net    300 ml    Last BM: 6/10   Labs:   Recent Labs Lab 03/19/13 1534  NA 138  K 4.2  CL 103  CO2 24  BUN 24*  CREATININE 0.87  CALCIUM 9.5  GLUCOSE 123*    CBG (last 3)  No results found for this basename: GLUCAP,  in the last 72 hours  Scheduled Meds: . antiseptic oral rinse  15 mL Mouth Rinse q12n4p  . apixaban  5 mg Oral BID  . feeding supplement  1 Container Oral QID  . lubiprostone  24 mcg Oral BID WC  . methylphenidate  10 mg Oral  BID WC  . multivitamin with minerals  1 tablet Oral Daily  . nystatin  5 mL Oral QID  . rosuvastatin  10 mg Oral q1800  . Travoprost (BAK Free)  1 drop Both Eyes QHS    Continuous Infusions: . sodium chloride Stopped (03/25/13 0729)    Jarold Motto MS, RD, LDN Pager: [phone] After-hours pager: [phone]

## 2013-03-25 NOTE — Progress Notes (Signed)
Occupational Therapy Session Note  Patient Details  Name: Frank Weeks MRN: [id number] Date of Birth: 15-Jul-1929  Today's Date: 03/25/2013 Time: 7846-9629 Time Calculation (min): 55 min  Short Term Goals: Week 2:  OT Short Term Goal 1 (Week 2): Pt will sit EOB for 2 mins with supervision during self-care task  OT Short Term Goal 2 (Week 2): Pt will complete UB dressing with min assist OT Short Term Goal 3 (Week 2): Pt will complete LB bathing with mod assist OT Short Term Goal 4 (Week 2): Pt will complete LB dressing with max assist OT Short Term Goal 5 (Week 2): Pt will complete toileting with max assist of one caregiver  Skilled Therapeutic Interventions/Progress Updates:    Pt seen for ADL retraining with focus on transfers, sit <> stand, and increased participation in self-care tasks of bathing and dressing.  Pt in bed upon arrival and conveys desire to attempt toileting.  Squat pivot transfer to drop arm BSC with max assist and pt initiating with reaching towards opposite arm rest.  Pt unable to toilet, stand step transfer from [facility] to w/c with use of sink as steady assist for upright standing posture and tactile cues to promote weight shifting.  Bathing and dressing completed at sink with sit <> stand x3 for perineal care and pulling up pants, pt with initiation and ability to pull pants over hips this session with support at trunk to maintain standing balance.  Pt reports need to urinate, stand pivot to [facility] and pt voided.  Pt continues to require max-total assist with toileting (hygiene and clothing management).  Pt with increased attention to task and initiation this session.  Downgraded goals to min-mod assist overall.  Therapy Documentation Precautions:  Precautions Precautions: Fall Precaution Comments: expressive aphasia Restrictions Weight Bearing Restrictions: No Pain:  Pt with no c/o pain this session.  See FIM for current functional status  Therapy/Group: Individual  Therapy  Leonette Monarch 03/25/2013, 10:34 AM

## 2013-03-25 NOTE — Progress Notes (Signed)
Patient ID: Frank Weeks, male   DOB: 04-08-1929, 79 y.o.   MRN: [id number] Subjective/Complaints: 79 y.o. right-handed male with history of CAD/CABG, PAF pacemaker, subarachnoid hemorrhage 50 years ago CVA. Admitted 03/08/2013 with fall reported by wife with right-sided weakness and slurred speech. Cranial CT showed developing low density left basal ganglia external capsule region consistent with acute infarct. Carotid Dopplers with no ICA stenosis. Echocardiogram with ejection fraction of 60% and normal systolic function. CT angiogram of the head showed a filling defect throughout the left M1 segment compatible with embolus causing acute infarct in left basal ganglia.  Comfortable in bed today  Review of Systems  Unable to perform ROS: medical condition   Objective: Vital Signs: Blood pressure 117/73, pulse 68, temperature 97.5 F (36.4 C), temperature source Oral, resp. rate 18, height 5\' 8"  (1.727 m), weight 73 kg (160 lb 15 oz), SpO2 98.00%. No results found. No results found for this or any previous visit (from the past 72 hour(s)).      Assessment/Plan: 1. Functional deficits secondary to Left MCA distribution infarct which require 3+ hours per day of interdisciplinary therapy in a comprehensive inpatient rehab setting. Physiatrist is providing close team supervision and 24 hour management of active medical problems listed below. Physiatrist and rehab team continue to assess barriers to discharge/monitor patient progress toward functional and medical goals.  Team conference today please see physician documentation under team conference tab, met with team face-to-face to discuss problems,progress, and goals. Formulized individual treatment plan based on medical history, underlying problem and comorbidities. FIM: FIM - Bathing Bathing Steps Patient Completed: Chest;Right Arm;Abdomen;Front perineal area;Right upper leg;Left upper leg Bathing: 3: Mod-Patient completes 5-7 3f 10 parts or  50-74%  FIM - Upper Body Dressing/Undressing Upper body dressing/undressing steps patient completed: Thread/unthread left sleeve of pullover shirt/dress;Put head through opening of pull over shirt/dress Upper body dressing/undressing: 3: Mod-Patient completed 50-74% of tasks FIM - Lower Body Dressing/Undressing Lower body dressing/undressing steps patient completed: Thread/unthread left pants leg Lower body dressing/undressing: 1: Total-Patient completed less than 25% of tasks  FIM - Toileting Toileting steps completed by patient: Performs perineal hygiene Toileting: 1: Two helpers  FIM - Diplomatic Services operational officer Devices: Psychiatrist Transfers: 0-Activity did not occur  FIM - Banker Devices: Arm rests;Bed rails Bed/Chair Transfer: 1: Supine > Sit: Total A (helper does all/Pt. < 25%);2: Bed > Chair or W/C: Max A (lift and lower assist)  FIM - Locomotion: Wheelchair Distance: 150 Locomotion: Wheelchair: 1: Total Assistance/staff pushes wheelchair (Pt<25%) FIM - Locomotion: Ambulation Locomotion: Ambulation Assistive Devices: TEFL teacher Ambulation/Gait Assistance: 1: +2 Total assist Locomotion: Ambulation: 1: Two helpers  Comprehension Comprehension Mode: Auditory Comprehension: 3-Understands basic 50 - 74% of the time/requires cueing 25 - 50%  of the time  Expression Expression Mode: Verbal Expression: 1-Expresses basis less than 25% of the time/requires cueing greater than 75% of the time.  Social Interaction Social Interaction: 2-Interacts appropriately 25 - 49% of time - Needs frequent redirection.  Problem Solving Problem Solving: 2-Solves basic 25 - 49% of the time - needs direction more than half the time to initiate, plan or complete simple activities  Memory Memory: 2-Recognizes or recalls 25 - 49% of the time/requires cueing 51 - 75% of the time  Medical Problem List and Plan:  1. Left  basal ganglia, external capsule infarct felt to be embolic , poor level of alertness, possible posterior of depression, trial Ritalin 2. DVT Prophylaxis/Anticoagulation: Eliquis  3. Pain Management: Tylenol as needed  4. Neuropsych: This patient is not capable of making decisions on his/her own behalf.  5. Dysphasia. Dysphagia 2 nectar thick liquids. Monitor for any signs of aspiration. Followup speech therapy  -encourage adequate PO intake  6. PAF/CAD/ CABG. Cardiac rate controlled. Continue Eliquis as directed. Followup cardiology services as needed  7. Hyperlipidemia. Crestor  8. Glaucoma. Continue eyedrops  9. Irritable bowel syndrome.Amitiza twice a day and MiraLAX daily.  -acute service to give bowel prep before transfer? 10. UTI urinalysis + switch to amoxicillin  LOS (Days) 12 A FACE TO FACE EVALUATION WAS PERFORMED  [name],[name] 03/25/2013, 9:58 AM

## 2013-03-26 ENCOUNTER — Encounter (HOSPITAL_COMMUNITY): Payer: Medicare Other

## 2013-03-26 ENCOUNTER — Inpatient Hospital Stay (HOSPITAL_COMMUNITY): Payer: Medicare Other | Admitting: Speech Pathology

## 2013-03-26 ENCOUNTER — Inpatient Hospital Stay (HOSPITAL_COMMUNITY): Payer: Medicare Other | Admitting: Occupational Therapy

## 2013-03-26 NOTE — Progress Notes (Signed)
Speech Language Pathology Daily Session Note  Patient Details  Name: Frank Weeks MRN: [id number] Date of Birth: 1928-11-19  Today's Date: 03/26/2013 Time: 0920-1000 Time Calculation (min): 40 min  Short Term Goals: Week 2: SLP Short Term Goal 1 (Week 2): Patient will perform oral motor and pharngeal strengthening exercises with Mod assist multimodal cueing  SLP Short Term Goal 2 (Week 2): Patient will answer yes/no questions with 90% accuracy. SLP Short Term Goal 3 (Week 2): Patient will sustain attention to basic, familiar tasks for 10 minutes with Supervision level verbal cues. SLP Short Term Goal 4 (Week 2): Patient will name basic ADL objects with Max assist semantic and phonemic cues. SLP Short Term Goal 5 (Week 2): Patient will self-monitor and correct errors with Max assist clinician cues.  Skilled Therapeutic Interventions: Skilled treatment session focused on addressing cognitive-linguistic dysphagia goals.  Patient accurately named 7/10 basic ALDs objects with semantic and phonemic cues.  Patient's errors were characterized by perseverative and semantic paraphasic errors.  SLP also facilitated session with Mod assisct verbal cues to verally produce same number of syllables as present in words during an oral reading task with newspaper headlines.  Patient consumed trials of water via cup with Min assist cues for small sips with throat clear in 3/10 trials and demonstrated effecient mastication of regular textures with Min assist verbal cues to self-monitor and correct left-sided pocketing.  Recommend upgrade to Dys.3 textures tomorrow with continuation of Nectar-thick liquids and full staff supervision.    FIM:  Comprehension Comprehension Mode: Auditory Comprehension: 3-Understands basic 50 - 74% of the time/requires cueing 25 - 50%  of the time Expression Expression Mode: Verbal Expression: 1-Expresses basis less than 25% of the time/requires cueing greater than 75% of the  time. Social Interaction Social Interaction: 2-Interacts appropriately 25 - 49% of time - Needs frequent redirection. Problem Solving Problem Solving: 2-Solves basic 25 - 49% of the time - needs direction more than half the time to initiate, plan or complete simple activities Memory Memory: 3-Recognizes or recalls 50 - 74% of the time/requires cueing 25 - 49% of the time FIM - Eating Eating Activity: 4: Helper checks for pocketed food  Pain Pain Assessment Pain Assessment: No/denies pain  Therapy/Group: Individual Therapy  Frank Weeks., CCC-SLP [phone]  Frank Weeks 03/26/2013, 10:25 AM

## 2013-03-26 NOTE — Progress Notes (Signed)
Occupational Therapy Note  Patient Details  Name: Frank Weeks MRN: [id number] Date of Birth: 1929-09-04 Today's Date: 03/26/2013  Time: 4098-1191 (cotx with Speech therapy-total time 1130-1210) Pt denies pain Group therapy  Pt participated in self feeding group with focus on BUE use for set up and self feeding, task initiation, and attention to task.  Pt required min verbal cues for portion control and adhering to swallowing precautions.  Pt was supervision for all self feeding tasks     Rich Brave 03/26/2013, 3:23 PM

## 2013-03-26 NOTE — Progress Notes (Signed)
Occupational Therapy Session Note  Patient Details  Name: Frank Weeks MRN: [id number] Date of Birth: 07-Apr-1929  Today's Date: 03/26/2013 Time: 1030-1130 Time Calculation (min): 60 min  Short Term Goals: Week 2:  OT Short Term Goal 1 (Week 2): Pt will sit EOB for 2 mins with supervision during self-care task  OT Short Term Goal 2 (Week 2): Pt will complete UB dressing with min assist OT Short Term Goal 3 (Week 2): Pt will complete LB bathing with mod assist OT Short Term Goal 4 (Week 2): Pt will complete LB dressing with max assist OT Short Term Goal 5 (Week 2): Pt will complete toileting with max assist of one caregiver  Skilled Therapeutic Interventions/Progress Updates:    Pt seen for ADL retraining with focus on transfers, sit <> stand, and increased participation in self-care tasks of bathing and dressing. Pt in bed upon arrival appearing alert with glasses on and newspaper in lap. Squat pivot transfer to w/c with max assist and pt initiating with reaching towards opposite arm rest. Bathing and dressing completed at sink with sit <> stand x3 for perineal care and pulling up pants, pt with initiation and ability to pull pants over hips this session with support at trunk to maintain standing balance.  Pt incontinent of bladder during session, reporting to this clinician he had wet his brief immediately after.  When asked, pt conveyed that he is able to tell when he has to urinate.  Encouraged pt to inform someone via call bell or gestures when in room the need to go.  Pt with increased attention to task and initiation this session. Pt's wife arrived at end of session and observed pt with sit <>stand and pulling up pants with support at trunk to maintain standing balance.  Therapy Documentation Precautions:  Precautions Precautions: Fall Precaution Comments: expressive aphasia Restrictions Weight Bearing Restrictions: No Pain: Pain Assessment Pain Assessment: No/denies pain  See FIM  for current functional status  Therapy/Group: Individual Therapy  Leonette Monarch 03/26/2013, 12:28 PM

## 2013-03-26 NOTE — Progress Notes (Signed)
Physical Therapy Session Note  Patient Details  Name: Frank Weeks MRN: [id number] Date of Birth: [birth year]  Today's Date: 03/26/2013 Time: 7829-5621 Time Calculation (min): 60 min  Skilled Therapeutic Interventions/Progress Updates:  PT restin gin bed, agreeable to therapy today, needing to use bathroom.  Performed rolling R/L x2 with rail and cues for technique with S to R and Min A to L. R sidelying >sit with Mod A for trunk and NMR for facilitating RUE under trunk and pressing up to sit. Pt able to bring RUE from side into adduction!!  Sitting balance with Min A >>close S with bil feet supported. Pt challenged with sitting balance to attempt shoes on feet/floor. Needing assist with shoes.  Scoot transfer bed>WC>toilet with rail and Mod A overall with cues for safety and sequence.   Gait training in hall with side-by-side therapist with L arm over therapist shoulders and R side supporting straight arm by side and trunk. Gait training 4x20' with +2 assist and manual facilitation at trunk for upright posture before taking L step, bil hip activation into full extension, and assist for full R step, although pt able to progress first half R step once ACE wrap applied for slick surface and DF assist. Therapist blocking R knee as well as completing step and gently assisting weight shifting. Sit<>stand x4 with Mod A overall and cues for efficiency. Pt able to static stand for 10 sec with min/mod A.  During seated rest break, performed manually resisted R hip/knee ext with trace activation.  Pt returned to room with  In [facility] with lap belt to visit with granddaughter.      Therapy Documentation Precautions:  Precautions Precautions: Fall Precaution Comments: expressive aphasia Restrictions Weight Bearing Restrictions: No General:   Vital Signs: Therapy Vitals Temp: 97.3 F (36.3 C) Temp src: Oral Pulse Rate: 136 Resp: 20 BP: 136/71 mmHg Patient Position, if appropriate: Sitting Oxygen  Therapy SpO2: 98 % O2 Device: None (Room air) Pain: Pt reports L great toe pain. Modified tx and provided show for protection.    Locomotion : Ambulation Ambulation/Gait Assistance: 1: +2 Total assist   :    See FIM for current functional status  Therapy/Group: Individual Therapy  Clydene Laming, PT, DPT   03/26/2013, 4:08 PM

## 2013-03-26 NOTE — Progress Notes (Signed)
Patient ID: Frank Weeks, male   DOB: [birth year], 79 y.o.   MRN: [id number] Subjective/Complaints: 79 y.o. right-handed male with history of CAD/CABG, PAF pacemaker, subarachnoid hemorrhage 50 years ago CVA. Admitted 03/08/2013 with fall reported by wife with right-sided weakness and slurred speech. Cranial CT showed developing low density left basal ganglia external capsule region consistent with acute infarct. Carotid Dopplers with no ICA stenosis. Echocardiogram with ejection fraction of 60% and normal systolic function. CT angiogram of the head showed a filling defect throughout the left M1 segment compatible with embolus causing acute infarct in left basal ganglia.  Smiling more, Eats 75-80%  Review of Systems  Unable to perform ROS: medical condition   Objective: Vital Signs: Blood pressure 93/61, pulse 70, temperature 98.2 F (36.8 C), temperature source Oral, resp. rate 20, height 5\' 8"  (1.727 m), weight 77.8 kg (171 lb 8.3 oz), SpO2 96.00%. No results found. No results found for this or any previous visit (from the past 72 hour(s)).      Assessment/Plan: 1. Functional deficits secondary to Left MCA distribution infarct which require 3+ hours per day of interdisciplinary therapy in a comprehensive inpatient rehab setting. Physiatrist is providing close team supervision and 24 hour management of active medical problems listed below. Physiatrist and rehab team continue to assess barriers to discharge/monitor patient progress toward functional and medical goals.  Team conference today please see physician documentation under team conference tab, met with team face-to-face to discuss problems,progress, and goals. Formulized individual treatment plan based on medical history, underlying problem and comorbidities. FIM: FIM - Bathing Bathing Steps Patient Completed: Chest;Right Arm;Abdomen;Front perineal area;Right upper leg;Left upper leg;Buttocks Bathing: 3: Mod-Patient completes 5-7 81f  10 parts or 50-74%  FIM - Upper Body Dressing/Undressing Upper body dressing/undressing steps patient completed: Thread/unthread left sleeve of pullover shirt/dress;Put head through opening of pull over shirt/dress Upper body dressing/undressing: 3: Mod-Patient completed 50-74% of tasks FIM - Lower Body Dressing/Undressing Lower body dressing/undressing steps patient completed: Thread/unthread left pants leg;Pull pants up/down Lower body dressing/undressing: 1: Total-Patient completed less than 25% of tasks  FIM - Toileting Toileting steps completed by patient: Performs perineal hygiene Toileting: 1: Total-Patient completed zero steps, helper did all 3  FIM - Diplomatic Services operational officer Devices: Psychiatrist Transfers: 1-Two helpers  FIM - Banker Devices: Arm rests;Bed rails Bed/Chair Transfer: 2: Supine > Sit: Max A (lifting assist/Pt. 25-49%);1: Two helpers  FIM - Locomotion: Wheelchair Distance: 150 Locomotion: Wheelchair: 3: Travels 150 ft or more: maneuvers on rugs and over door sills with moderate assistance  (Pt: 50 - 74%) FIM - Locomotion: Ambulation Locomotion: Ambulation Assistive Devices: TEFL teacher Ambulation/Gait Assistance: 1: +2 Total assist Locomotion: Ambulation: 1: Two helpers  Comprehension Comprehension Mode: Auditory Comprehension: 3-Understands basic 50 - 74% of the time/requires cueing 25 - 50%  of the time  Expression Expression Mode: Verbal Expression: 1-Expresses basis less than 25% of the time/requires cueing greater than 75% of the time.  Social Interaction Social Interaction: 2-Interacts appropriately 25 - 49% of time - Needs frequent redirection.  Problem Solving Problem Solving: 2-Solves basic 25 - 49% of the time - needs direction more than half the time to initiate, plan or complete simple activities  Memory Memory: 3-Recognizes or recalls 50 - 74% of the  time/requires cueing 25 - 49% of the time  Medical Problem List and Plan:  1. Left basal ganglia, external capsule infarct felt to be embolic , poor level of alertness, possible  posterior of depression, trial Ritalin 2. DVT Prophylaxis/Anticoagulation: Eliquis  3. Pain Management: Tylenol as needed  4. Neuropsych: This patient is not capable of making decisions on his/her own behalf.  5. Dysphasia. Dysphagia 2 nectar thick liquids. Monitor for any signs of aspiration. Followup speech therapy  -encourage adequate PO intake  6. PAF/CAD/ CABG. Cardiac rate controlled. Continue Eliquis as directed. Followup cardiology services as needed  7. Hyperlipidemia. Crestor  8. Glaucoma. Continue eyedrops  9. Irritable bowel syndrome.Amitiza twice a day and MiraLAX daily.  -acute service to give bowel prep before transfer? 10. UTI urinalysis + switch to amoxicillin  LOS (Days) 13 A FACE TO FACE EVALUATION WAS PERFORMED  [name],[name] 03/26/2013, 8:32 AM

## 2013-03-26 NOTE — Progress Notes (Signed)
Speech Language Pathology Daily Session Note  Patient Details  Name: Frank Weeks MRN: [id number] Date of Birth: 1929-05-21  Today's Date: 03/26/2013 Time: 1130-1155 Time Calculation (min): 25 min  Short Term Goals: Week 2: SLP Short Term Goal 1 (Week 2): Patient will perform oral motor and pharngeal strengthening exercises with Mod assist multimodal cueing  SLP Short Term Goal 2 (Week 2): Patient will answer yes/no questions with 90% accuracy. SLP Short Term Goal 3 (Week 2): Patient will sustain attention to basic, familiar tasks for 10 minutes with Supervision level verbal cues. SLP Short Term Goal 4 (Week 2): Patient will name basic ADL objects with Max assist semantic and phonemic cues. SLP Short Term Goal 5 (Week 2): Patient will self-monitor and correct errors with Max assist clinician cues.  Skilled Therapeutic Interventions: Pt participated in co-treatment with OT in diners club with focus on self-feeding, dysphagia and cognitive-linguistic goals.  Pt required Min verbal cues for utilization of mirror to self-monitor and correct anterior spillage and Min verbal cues for small sips via cup. Pt without overt s/s of aspiration. Pt utilized facial expressions, gestures and minimal verbal expression to communicate wants/needs.    FIM:  FIM - Eating Eating Activity: 5: Supervision/cues  Pain Pain Assessment Pain Assessment: No/denies pain  Therapy/Group: Group Therapy  [name] 03/26/2013, 4:35 PM

## 2013-03-27 ENCOUNTER — Inpatient Hospital Stay (HOSPITAL_COMMUNITY): Payer: Medicare Other | Admitting: Speech Pathology

## 2013-03-27 ENCOUNTER — Inpatient Hospital Stay (HOSPITAL_COMMUNITY): Payer: Medicare Other | Admitting: Occupational Therapy

## 2013-03-27 ENCOUNTER — Inpatient Hospital Stay (HOSPITAL_COMMUNITY): Payer: Medicare Other | Admitting: Physical Therapy

## 2013-03-27 NOTE — Progress Notes (Signed)
Physical Therapy Note  Patient Details  Name: DALLIS CZAJA MRN: [id number] Date of Birth: 1928-12-17 Today's Date: 03/27/2013  [employer] with SLP  No c/o pain 2536-6440 Self feeding group with focus on attention to placement of right LE, managing small bites, self monitoring anterior pillage of food, right pocketing with min questioning cues and vocal quality while eating.    Roney [facility] 03/27/2013, 1:28 PM

## 2013-03-27 NOTE — Progress Notes (Signed)
Speech Language Pathology Daily Session Note  Patient Details  Name: Frank Weeks MRN: [id number] Date of Birth: 09-06-1929  Today's Date: 03/27/2013 Time: 1130-1145 Time Calculation (min): 15 min  Short Term Goals: Week 3: SLP Short Term Goal 1 (Week 3): Patient will perform oral motor and pharngeal strengthening exercises with Min assist multimodal cueing  SLP Short Term Goal 2 (Week 3): Patient will answer yes/no questions with 90% accuracy. SLP Short Term Goal 3 (Week 3): Patient will name basic ADL objects with Mod assist semantic and phonemic cues. SLP Short Term Goal 4 (Week 3): Patient will self-monitor and correct errors with Mod assist clinician cues. SLP Short Term Goal 6 (Week 3): Patient will consume thin liquids via cup with Min assist clinician cues to prevent overt s/s of aspiration.  Skilled Therapeutic Interventions: Pt participated in co-treatment with OT in din[employer] [place] with focus on self-feeding, dysphagia and cognitive-linguistic goals. Pt required Min verbal cues for utilization of mirror to self-monitor and correct anterior spillage and Min verbal cues for small sips via cup. Pt demonstrated efficient mastication of upgraded Dys. 3 textures but also demonstrated an intermittent wet vocal quality that cleared with a cued throat clear. Pt utilized facial expressions, gestures and minimal verbal expression to communicate wants/needs.    FIM:  Comprehension Comprehension Mode: Auditory Comprehension: 3-Understands basic 50 - 74% of the time/requires cueing 25 - 50%  of the time Expression Expression Mode: Verbal Expression: 1-Expresses basis less than 25% of the time/requires cueing greater than 75% of the time. Social Interaction Social Interaction: 2-Interacts appropriately 25 - 49% of time - Needs frequent redirection. Problem Solving Problem Solving: 2-Solves basic 25 - 49% of the time - needs direction more than half the time to initiate, plan or complete simple  activities Memory Memory: 3-Recognizes or recalls 50 - 74% of the time/requires cueing 25 - 49% of the time FIM - Eating Eating Activity: 4: Helper checks for pocketed food  Pain Pain Assessment Pain Assessment: No/denies pain  Therapy/Group: Group Therapy  Breeanna Galgano 03/27/2013, 4:12 PM

## 2013-03-27 NOTE — Progress Notes (Signed)
Patient ID: Frank Weeks, male   DOB: 1928-10-20, 79 y.o.   MRN: [id number] Subjective/Complaints: 79 y.o. right-handed male with history of CAD/CABG, PAF pacemaker, subarachnoid hemorrhage 50 years ago CVA. Admitted 03/08/2013 with fall reported by wife with right-sided weakness and slurred speech. Cranial CT showed developing low density left basal ganglia external capsule region consistent with acute infarct. Carotid Dopplers with no ICA stenosis. Echocardiogram with ejection fraction of 60% and normal systolic function. CT angiogram of the head showed a filling defect throughout the left M1 segment compatible with embolus causing acute infarct in left basal ganglia.  Can state his name  Review of Systems  Unable to perform ROS: medical condition   Objective: Vital Signs: Blood pressure 120/66, pulse 73, temperature 97.3 F (36.3 C), temperature source Oral, resp. rate 18, height 5\' 8"  (1.727 m), weight 77.8 kg (171 lb 8.3 oz), SpO2 97.00%. No results found. No results found for this or any previous visit (from the past 72 hour(s)).      Assessment/Plan: 1. Functional deficits secondary to Left MCA distribution infarct which require 3+ hours per day of interdisciplinary therapy in a comprehensive inpatient rehab setting. Physiatrist is providing close team supervision and 24 hour management of active medical problems listed below. Physiatrist and rehab team continue to assess barriers to discharge/monitor patient progress toward functional and medical goals.  FIM: FIM - Bathing Bathing Steps Patient Completed: Chest;Right Arm;Abdomen;Front perineal area;Right upper leg;Left upper leg;Buttocks Bathing: 3: Mod-Patient completes 5-7 9f 10 parts or 50-74%  FIM - Upper Body Dressing/Undressing Upper body dressing/undressing steps patient completed: Thread/unthread left sleeve of pullover shirt/dress;Put head through opening of pull over shirt/dress Upper body dressing/undressing: 3:  Mod-Patient completed 50-74% of tasks FIM - Lower Body Dressing/Undressing Lower body dressing/undressing steps patient completed: Pull underwear up/down;Pull pants up/down Lower body dressing/undressing: 1: Total-Patient completed less than 25% of tasks  FIM - Toileting Toileting steps completed by patient: Performs perineal hygiene Toileting: 1: Total-Patient completed zero steps, helper did all 3  FIM - Diplomatic Services operational officer Devices: Psychiatrist Transfers: 1-Two helpers  FIM - Banker Devices: Arm rests;Bed rails Bed/Chair Transfer: 3: Supine > Sit: Mod A (lifting assist/Pt. 50-74%/lift 2 legs;3: Bed > Chair or W/C: Mod A (lift or lower assist);3: Chair or W/C > Bed: Mod A (lift or lower assist)  FIM - Locomotion: Wheelchair Distance: 150 Locomotion: Wheelchair: 1: Total Assistance/staff pushes wheelchair (Pt<25%) FIM - Locomotion: Ambulation Locomotion: Ambulation Assistive Devices: Other (comment) (2 person arm over arm) Ambulation/Gait Assistance: 1: +2 Total assist Locomotion: Ambulation: 1: Two helpers  Comprehension Comprehension Mode: Auditory Comprehension: 3-Understands basic 50 - 74% of the time/requires cueing 25 - 50%  of the time  Expression Expression Mode: Verbal Expression: 3-Expresses basic 50 - 74% of the time/requires cueing 25 - 50% of the time. Needs to repeat parts of sentences.  Social Interaction Social Interaction: 3-Interacts appropriately 50 - 74% of the time - May be physically or verbally inappropriate.  Problem Solving Problem Solving: 2-Solves basic 25 - 49% of the time - needs direction more than half the time to initiate, plan or complete simple activities  Memory Memory: 2-Recognizes or recalls 25 - 49% of the time/requires cueing 51 - 75% of the time  Medical Problem List and Plan:  1. Left basal ganglia, external capsule infarct felt to be embolic , poor level of  alertness, possible posterior of depression, trial Ritalin 2. DVT Prophylaxis/Anticoagulation: Eliquis  3. Pain  Management: Tylenol as needed  4. Neuropsych: This patient is not capable of making decisions on his/her own behalf.  5. Dysphasia. Dysphagia 2 nectar thick liquids. Monitor for any signs of aspiration. Followup speech therapy  -encourage adequate PO intake  6. PAF/CAD/ CABG. Cardiac rate controlled. Continue Eliquis as directed. Followup cardiology services as needed  7. Hyperlipidemia. Crestor  8. Glaucoma. Continue eyedrops  9. Irritable bowel syndrome.Amitiza twice a day and MiraLAX daily.  -acute service to give bowel prep before transfer? 10. UTI completed Abx  LOS (Days) 14 A FACE TO FACE EVALUATION WAS PERFORMED  [name],[name] 03/27/2013, 8:27 AM

## 2013-03-27 NOTE — Progress Notes (Signed)
Social Work Patient ID: Frank Weeks, male   DOB: [birth year], 79 y.o.   MRN: [id number] Spoke with [employer] who has authorized pt until 6/19 and will need another update then.  Informed her wife would like to take him home, but  Unsure if will be able to provide the amount of care pt will require at discharge.

## 2013-03-27 NOTE — Progress Notes (Signed)
Speech Language Pathology Weekly Progress Note & Daily Session Note  Patient Details  Name: Frank Weeks MRN: [id number] Date of Birth: 1929/02/11  Today's Date: 03/27/2013  Short Term Goals: Week 2: SLP Short Term Goal 1 (Week 2): Patient will perform oral motor and pharngeal strengthening exercises with Mod assist multimodal cueing  SLP Short Term Goal 1 - Progress (Week 2): Met SLP Short Term Goal 2 (Week 2): Patient will answer yes/no questions with 90% accuracy. SLP Short Term Goal 2 - Progress (Week 2): Progressing toward goal SLP Short Term Goal 3 (Week 2): Patient will sustain attention to basic, familiar tasks for 10 minutes with Supervision level verbal cues. SLP Short Term Goal 3 - Progress (Week 2): Met SLP Short Term Goal 4 (Week 2): Patient will name basic ADL objects with Max assist semantic and phonemic cues. SLP Short Term Goal 4 - Progress (Week 2): Met SLP Short Term Goal 5 (Week 2): Patient will self-monitor and correct errors with Max assist clinician cues. SLP Short Term Goal 5 - Progress (Week 2): Met Week 3: SLP Short Term Goal 1 (Week 3): Patient will perform oral motor and pharngeal strengthening exercises with Min assist multimodal cueing  SLP Short Term Goal 2 (Week 3): Patient will answer yes/no questions with 90% accuracy. SLP Short Term Goal 3 (Week 3): Patient will name basic ADL objects with Mod assist semantic and phonemic cues. SLP Short Term Goal 4 (Week 3): Patient will self-monitor and correct errors with Mod assist clinician cues.  Weekly Progress Updates: Patient met 4 out of 5 short term objectives this reporting period due to gains in arousal, sustained attention, naming accuracy and self-monitoring.  Patient's apraxia and aphasia continue to impact his overall ability to verbally express wants and needs as well as safely complete basic self-care tasks.  Patient's endurance and participation has increased this week and his diet has been advanved to  Dys.3 textures with Nectar-thick liquids.  As a result, he continues to require skilled SLP services to address dysphagia, speech-language and cognitive deficits to maximize functional independence and reduce burden of care prior to discharge home with wife.     SLP Intensity: Minumum of 1-2 x/day, 30 to 90 minutes SLP Frequency: 5 out of 7 days SLP Duration/Estimated Length of Stay: 2 weeks SLP Treatment/Interventions: Cognitive remediation/compensation;Dysphagia/aspiration precaution training;Patient/family education;Oral motor exercises;Therapeutic Exercise;Therapeutic Activities;Speech/Language facilitation;Functional tasks  Charlane Ferretti CCC-SLP [phone]  Neftaly Swiss 03/27/2013, 10:06 AM     Speech Language Pathology Daily Session Note  Patient Details  Name: Frank Weeks MRN: [id number] Date of Birth: 14-Mar-1929  Today's Date: 03/27/2013 Time: 1308-6578 Time Calculation (min): 45 min  Short Term Goals: Week 3: SLP Short Term Goal 1 (Week 3): Patient will perform oral motor and pharngeal strengthening exercises with Min assist multimodal cueing  SLP Short Term Goal 2 (Week 3): Patient will answer yes/no questions with 90% accuracy. SLP Short Term Goal 3 (Week 3): Patient will name basic ADL objects with Mod assist semantic and phonemic cues. SLP Short Term Goal 4 (Week 3): Patient will self-monitor and correct errors with Mod assist clinician cues. SLP Short Term Goal 6 (Week 3): Patient will consume thin liquids via cup with Min assist clinician cues to prevent overt s/s of aspiration.  Skilled Therapeutic Interventions: Skilled treatment session focused on addressing cognitive-linguistic and dysphagia goals.  Patient accurately named 7/10 basic ALDs objects with Mod assist semantic cues.  SLP also facilitated session with Max assist verbal and visual cues  to perform diaphragmatic breathing exercises.  Patient consumed Dys.3 textures and Nectar-thick iquids with cough x1  due to pocketing of textures folllowed by sip with then intermittne throat clears for rest up meal suspected to be a result of earlier penenrtation/aspiration event.  Recommend continue with current plan of care.     FIM:  Comprehension Comprehension Mode: Auditory Comprehension: 3-Understands basic 50 - 74% of the time/requires cueing 25 - 50%  of the time Expression Expression Mode: Verbal Expression: 1-Expresses basis less than 25% of the time/requires cueing greater than 75% of the time. Social Interaction Social Interaction: 2-Interacts appropriately 25 - 49% of time - Needs frequent redirection. Problem Solving Problem Solving: 2-Solves basic 25 - 49% of the time - needs direction more than half the time to initiate, plan or complete simple activities Memory Memory: 3-Recognizes or recalls 50 - 74% of the time/requires cueing 25 - 49% of the time FIM - Eating Eating Activity: 4: Helper checks for pocketed food  Pain Pain Assessment Pain Assessment: No/denies pain  Therapy/Group: Individual Therapy  Charlane Ferretti., CCC-SLP [phone]  Katelyne Galster 03/27/2013, 10:29 AM

## 2013-03-27 NOTE — Progress Notes (Signed)
Physical Therapy Weekly Progress Note  Patient Details  Name: Frank Weeks MRN: [id number] Date of Birth: 27-Sep-1929  Today's Date: 03/27/2013 Time: 0230-0300 (full time 200-300; 60 min co-treat with SPH) Time Calculation (min): 30 min  Patient has made excellent progress and has met 3 of 5 short term goals.  On 15 hours of therapy over 7 days patient has shown improved participation and activity tolerance.  Secondary to improved activity tolerance pt to be upgraded to 3.5 hours of therapy on Monday.  Pt is currently max A of one person for bed mobility with use of bed rail, bed <> w/c transfers with stand or squat pivot, min A w/c mobility in controlled environment and +2 A for gait short distances with PFRW.  D/C disposition continues to remain unclear at this time.  Pt still requires significant physical assistance for all functional mobility and self care and may still require more prolonged rehabilitation at SNF to maximize functional mobility independence and minimize burden of care prior to D/C home with wife who can provide supervision only.   Patient continues to demonstrate the following deficits: impaired activity tolerance and endurance, R sided hemiplegia, apraxia, visual and perceptual deficits, cognitive and language impairments, impaired postural control, balance, gait and therefore will continue to benefit from skilled PT intervention to enhance overall performance with activity tolerance, balance, postural control, ability to compensate for deficits, functional use of  right upper extremity and right lower extremity and attention.  Patient progressing toward long term goals..  Continue plan of care.  PT Short Term Goals Week 2:  PT Short Term Goal 1 (Week 2): Patient will be able to perform bed mobility with mod-Assist. PT Short Term Goal 1 - Progress (Week 2): Progressing toward goal PT Short Term Goal 2 (Week 2): Patient will be able to perform transfers with Mod-Assist. PT Short  Term Goal 2 - Progress (Week 2): Progressing toward goal PT Short Term Goal 3 (Week 2): Patient will be able to ambulate 10' using LRAD with Max-Assist  PT Short Term Goal 3 - Progress (Week 2): Met PT Short Term Goal 4 (Week 2): Patient will be able to maintain dynamic sitting balance with Min-assist PT Short Term Goal 4 - Progress (Week 2): Met PT Short Term Goal 5 (Week 2): Patient will participate in w/c mobility with hemi technique with max A x 25' PT Short Term Goal 5 - Progress (Week 2): Met Week 3:  PT Short Term Goal 1 (Week 3): Pt will perform bed mobility with bed rail and mod A PT Short Term Goal 2 (Week 3): Pt will perform bed <> w/c consistently to L and R mod A PT Short Term Goal 3 (Week 3): Pt will peform w/c mobility in controlled environment x 150' supervision PT Short Term Goal 4 (Week 3): Pt will perform gait in controlled environment with platform RW and orthosis x 50' and max A of one person  Skilled Therapeutic Interventions/Progress Updates:   Co-treat with OT with focus on NM re-ed for trunk control and weight shifting in sitting and standing with focus on decreasing level of assistance with mobility and transfers. Pt resting in bed upon arrival, but willing to get OOB. Performed supine > sit and Stand pivot transfer to w/c with OT and w/c <> mat with PT stand pivot with pt requiring max A overall for lateral weight shifting and pelvic translation to scoot to edge of bed and w/c and continues to require assistance to maintain R  knee extension in stance. Engaged in trunk rotation and elongation activities in unsupported sitting with reaching outside BOS across midline and to ground with weight shifting and WB through RUE in ER and extension to increase trunk control and balance with transfers.  Progressed from needing max-total A to reach out of BOS and self correct LOB to supervision-min A. Completed weight shifting and single stepping activity in standing with focus on weight  shifting to assist in more natural stand pivot transfers. Pt with decreased ability to maintain upright trunk or weight shift onto RLE to allow for stepping forward or to side with LLE even with max-total A to maintain RLE extension and hip extension on R. Sit <> stand activity with Lt hand over Rt hand on RLE for weight bearing and focus on "nose over toes" and looking up with improved trunk control with sit to stand, continuing to require lifting assistance.  Transferred back to bed at end of session secondary to family leaving.     Therapy Documentation Precautions:  Precautions Precautions: Fall Precaution Comments: expressive aphasia Restrictions Weight Bearing Restrictions: No Pain: Pain Assessment Pain Assessment: Faces Faces Pain Scale: Hurts even more Pain Type: Acute pain Pain Location:  (great) Pain Orientation: Left Pain Descriptors / Indicators: Sore Pain Onset: Other (Comment) (when touched; pressure on it) Pain Intervention(s): Repositioned  See FIM for current functional status  Therapy/Group: Co-Treatment  [name] Faucette 03/27/2013, 5:02 PM

## 2013-03-27 NOTE — Progress Notes (Signed)
Social Work Patient ID: Frank Weeks, male   DOB: 03/07/1929, 79 y.o.   MRN: [id number] Met with pt and wife to inform team conference progression toward goals and discharge.  Wife is pleased with his progress and continues to want to take Pt home.  But can not provide any physcial care. She will begin looking at NH to prepare if pt requires this level at discharge.  Medical issues MD is addressing Continue to work on a safe  discharge plan.

## 2013-03-27 NOTE — Progress Notes (Signed)
Occupational Therapy Weekly Progress Note  Patient Details  Name: Frank Weeks MRN: [id number] Date of Birth: [birth year]  Today's Date: 03/27/2013 Time: 1035-1130 and 1400-1430 (co-tx with PT 1400-1500) Time Calculation (min): 55 min and 30 min  Patient has met 3 of 4 short term goals.  Pt is making steady progress towards goals.  Pt is more alert and willing to participate in treatment sessions and has begun to show increased initiation with self-care tasks, transfers, and weight shifting in standing to complete toileting tasks.  Pt continues to require max assist with transfers and mod-max assist for balance with dynamic standing tasks. Pt has been scheduled at 15/7 to allow for increased rest breaks and to improve activity tolerance with therapy as has participated in treatment session this week, plan to return pt to full therapies next week as pt making steady strides toward tolerating increased OOB activity.  Pt continues to require max physical assistance to which pt's wife cannot provide more than supervision level, may need to pursue SNF to allow for more prolonged rehabilitation.  Patient continues to demonstrate the following deficits: Rt hemiplegia, impaired trunk control and postural control, impaired activity tolerance and endurance, sitting and standing balance and therefore will continue to benefit from skilled OT intervention to enhance overall performance with BADL and Reduce care partner burden.  Patient progressing toward long term goals..  Plan of care revisions: downgraded standing, toileting, and LB dressing goals to mod assist.  OT Short Term Goals Week 2:  OT Short Term Goal 1 (Week 2): Pt will sit EOB for 2 mins with supervision during self-care task  OT Short Term Goal 1 - Progress (Week 2): Met OT Short Term Goal 2 (Week 2): Pt will complete UB dressing with min assist OT Short Term Goal 2 - Progress (Week 2): Progressing toward goal OT Short Term Goal 3 (Week 2): Pt will  complete LB bathing with mod assist OT Short Term Goal 3 - Progress (Week 2): Met OT Short Term Goal 4 (Week 2): Pt will complete LB dressing with max assist OT Short Term Goal 4 - Progress (Week 2): Met OT Short Term Goal 5 (Week 2): Pt will complete toileting with max assist of one caregiver OT Short Term Goal 5 - Progress (Week 2): Met Week 3:  OT Short Term Goal 1 (Week 3): Pt will complete UB dressing with min assist OT Short Term Goal 2 (Week 3): Pt will complete LB bathing with min assist OT Short Term Goal 3 (Week 3): Pt will complete LB dressing with mod assist OT Short Term Goal 4 (Week 3): Pt will complete shower transfer with max assist OT Short Term Goal 5 (Week 3): Pt will complete toileting with mod assist of one caregiver  Skilled Therapeutic Interventions/Progress Updates:    1) Pt seen for ADL retraining with focus on functional mobility, transfers, sit <> stand, and increased participation in self-care tasks.  Pt in bed upon arrival reading newspaper.  Squat pivot transfer bed to w/c with pt reaching for arm rest without verbal cues this session.  Engaged in bathing and dressing at sit <> stand level at sink with focus on weight shifting and trunk control with LB bathing.  While bathing, pt conveyed need to toilet, performed stand pivot to [facility] with UE support on sink.  Pt with increased standing tolerance this session, with ability to complete hygiene and pulling up pants with mod-max assist to maintain standing balance.  Pt's wife present throughout session  with comments on pt's progress and questions regarding follow up and prognosis.  2) Pt seen for co-tx with PT with focus on NM re-ed with trunk control and weight shifting in sitting and standing with focus on decreasing level of assistance with mobility and self-care tasks.  Pt resting in bed upon arrival, but willing to get OOB.  Stand pivot transfer to w/c with pt requiring increased assistance with weight shift at hips this  session.  Engaged in trunk rotation and elongation activities in unsupported sitting with reaching outside BOS with weight shifting to increase trunk control with self-care tasks in sitting and RUE with shoulder in external rotation to promote weight bearing.  Completed weight shifting and single stepping activity in standing with focus on weight shifting to assist in more natural stand pivot transfers.  Pt with decreased ability to weight shift onto RLE to allow for stepping forward or to side with LLE.  Sit <> stand activity with Lt hand over Rt hand for weight bearing and focus on "nose over toes" and looking up with improved trunk control with sit to stand, continuing to require lifting assistance.  Pt returned to bed with stand pivot transfer with pt requiring increased assistance with pivot due to transferring to Rt.  Therapy Documentation Precautions:  Precautions Precautions: Fall Precaution Comments: expressive aphasia Restrictions Weight Bearing Restrictions: No Pain: Pain Assessment Pain Assessment: No/denies pain ADL: ADL Grooming: Moderate assistance Where Assessed-Grooming: Sitting at sink Upper Body Bathing: Minimal assistance Where Assessed-Upper Body Bathing: Sitting at sink Lower Body Bathing: Moderate assistance Where Assessed-Lower Body Bathing: Sitting at sink;Standing at sink Upper Body Dressing: Moderate assistance Where Assessed-Upper Body Dressing: Sitting at sink Lower Body Dressing: Maximal assistance Where Assessed-Lower Body Dressing: Sitting at sink;Standing at sink Toileting: Maximal assistance Where Assessed-Toileting: Bedside Commode Toilet Transfer: Maximal assistance Toilet Transfer Method: Stand pivot;Squat pivot Toilet Transfer Equipment: Extra wide drop arm bedside commode ADL Comments: Pt demonstrating increased initiation with transfers and weight shifting to assist in LB dressing and toileting tasks.  See FIM for current functional  status  Therapy/Group: Individual Therapy and Co-Treatment  Leonette Monarch 03/27/2013, 11:46 AM

## 2013-03-28 ENCOUNTER — Inpatient Hospital Stay (HOSPITAL_COMMUNITY): Payer: Medicare Other | Admitting: Physical Therapy

## 2013-03-28 ENCOUNTER — Inpatient Hospital Stay (HOSPITAL_COMMUNITY): Payer: Medicare Other | Admitting: Speech Pathology

## 2013-03-28 DIAGNOSIS — I6340 Cerebral infarction due to embolism of unspecified cerebral artery: Secondary | ICD-10-CM

## 2013-03-28 DIAGNOSIS — G8110 Spastic hemiplegia affecting unspecified side: Secondary | ICD-10-CM

## 2013-03-28 DIAGNOSIS — I69991 Dysphagia following unspecified cerebrovascular disease: Secondary | ICD-10-CM

## 2013-03-28 DIAGNOSIS — I4891 Unspecified atrial fibrillation: Secondary | ICD-10-CM

## 2013-03-28 MED ORDER — SODIUM CHLORIDE 0.9 % IV SOLN: INTRAVENOUS | Status: DC

## 2013-03-28 NOTE — Plan of Care (Signed)
Problem: RH SKIN INTEGRITY Goal: RH STG SKIN FREE OF INFECTION/BREAKDOWN Skin free of infection/breakdown with maxx assist [ pt repositions self in bed],staff clean skin after incont, episodes  Outcome: Progressing Pt able to reposition self in bed when he wants to move

## 2013-03-28 NOTE — Progress Notes (Signed)
Physical Therapy Session Note  Patient Details  Name: Frank Weeks MRN: [id number] Date of Birth: 05/22/1929  Today's Date: 03/28/2013 Time: 1020-1115 Time Calculation (min): 55 min  Short Term Goals: Week 3:  PT Short Term Goal 1 (Week 3): Pt will perform bed mobility with bed rail and mod A PT Short Term Goal 2 (Week 3): Pt will perform bed <> w/c consistently to L and R mod A PT Short Term Goal 3 (Week 3): Pt will peform w/c mobility in controlled environment x 150' supervision PT Short Term Goal 4 (Week 3): Pt will perform gait in controlled environment with platform RW and orthosis x 50' and max A of one person  Skilled Therapeutic Interventions/Progress Updates: Pt was seen bedside in the am with assistance of PT tech. Pt willing to participate with therapy. Pt rolled into R sidelying with mod Ax1, vc and tactile cues for technique. Pt transferred sidelying to EOB with siderail and max Ax1, vc and tactiles cues for technique. Pt initially required mod Ax1 for balanbce on EOB progressing to min Ax1. Pt transferred EOB to w/c (strong side), with max Ax1 and verbal and tactile cues for safety and technique.  Pt able to reposition in w/c with verbal and tactile cues. Pt transported to rehab gym. Worked in parallel bars on standing and weight shifting to advance the L LE for gait training. Pt able to ambulate in parallel bars with max Ax1 and verbal and physical cues. Pt ambulated in the hallway x3 with hemiwalker and +2 assist. Pt ambulated distances of 10, 18, and 24 feet. Placed ace wrap on R foot to assist with swing through. Pt required verbal and physical cues for sequencing and weight shift during swing phase. Following ambulation, pt propelled w/c ~ 50 feet with mod Ax1 and vc for encouragement and technique with L UE and L LE. While ambulating, pt required physical assist to advance the R LE and bracing of R LE during swing phase on L to prevent knee buckling. Pt tends to lean to right during  ambulation requiring verbal and physical assist to correct.      Therapy Documentation Precautions:  Precautions Precautions: Fall Precaution Comments: expressive aphasia Restrictions Weight Bearing Restrictions: No Pain: No c/o pain   Locomotion : Ambulation Ambulation/Gait Assistance: 2: Max assist   See FIM for current functional status  Therapy/Group: Individual Therapy  Rayford Halsted 03/28/2013, 12:16 PM

## 2013-03-28 NOTE — Progress Notes (Signed)
Speech Language Pathology Daily Session Note  Patient Details  Name: Frank Weeks MRN: [id number] Date of Birth: 1929/05/07  Today's Date: 03/28/2013 Time: 1130-1155 Time Calculation (min): 25 min  Short Term Goals: Week 3: SLP Short Term Goal 1 (Week 3): Patient will perform oral motor and pharngeal strengthening exercises with Min assist multimodal cueing  SLP Short Term Goal 2 (Week 3): Patient will answer yes/no questions with 90% accuracy. SLP Short Term Goal 3 (Week 3): Patient will name basic ADL objects with Mod assist semantic and phonemic cues. SLP Short Term Goal 4 (Week 3): Patient will self-monitor and correct errors with Mod assist clinician cues. SLP Short Term Goal 6 (Week 3): Patient will consume thin liquids via cup with Min assist clinician cues to prevent overt s/s of aspiration.  Skilled Therapeutic Interventions: Therapeutic intervention complete, with co-treatment with OT during [employer].  Patient required min A verbal cues to use compensatory strategies to decrease aspiration risk.   FIM:  FIM - Eating Eating Activity: 5: Set-up assist for cut food  Pain Pain Assessment Pain Assessment: No/denies pain  Therapy/Group: Group Therapy  Lenny Pastel 03/28/2013, 4:38 PM

## 2013-03-28 NOTE — Progress Notes (Signed)
Patient ID: Frank Weeks, male   DOB: 11/07/1928, 79 y.o.   MRN: [id number] Subjective/Complaints: 79 y.o. right-handed male with history of CAD/CABG, PAF pacemaker, subarachnoid hemorrhage 50 years ago CVA. Admitted 03/08/2013 with fall reported by wife with right-sided weakness and slurred speech. Cranial CT showed developing low density left basal ganglia external capsule region consistent with acute infarct. Carotid Dopplers with no ICA stenosis. Echocardiogram with ejection fraction of 60% and normal systolic function. CT angiogram of the head showed a filling defect throughout the left M1 segment compatible with embolus causing acute infarct in left basal ganglia.  No complaints. Not in distress  Review of Systems  Unable to perform ROS: medical condition   Objective: Vital Signs: Blood pressure 124/76, pulse 71, temperature 97.5 F (36.4 C), temperature source Oral, resp. rate 17, height 5\' 8"  (1.727 m), weight 77.8 kg (171 lb 8.3 oz), SpO2 97.00%. No results found. No results found for this or any previous visit (from the past 72 hour(s)).      Assessment/Plan: 1. Functional deficits secondary to Left MCA distribution infarct which require 3+ hours per day of interdisciplinary therapy in a comprehensive inpatient rehab setting. Physiatrist is providing close team supervision and 24 hour management of active medical problems listed below. Physiatrist and rehab team continue to assess barriers to discharge/monitor patient progress toward functional and medical goals.  FIM: FIM - Bathing Bathing Steps Patient Completed: Chest;Right Arm;Abdomen;Front perineal area;Right upper leg;Left upper leg;Buttocks Bathing: 3: Mod-Patient completes 5-7 24f 10 parts or 50-74%  FIM - Upper Body Dressing/Undressing Upper body dressing/undressing steps patient completed: Thread/unthread left sleeve of pullover shirt/dress;Put head through opening of pull over shirt/dress Upper body  dressing/undressing: 3: Mod-Patient completed 50-74% of tasks FIM - Lower Body Dressing/Undressing Lower body dressing/undressing steps patient completed: Pull underwear up/down;Pull pants up/down Lower body dressing/undressing: 2: Max-Patient completed 25-49% of tasks  FIM - Toileting Toileting steps completed by patient: Performs perineal hygiene;Adjust clothing after toileting Toileting: 2: Max-Patient completed 1 of 3 steps  FIM - Diplomatic Services operational officer Devices: Bedside commode Toilet Transfers: 2-To toilet/BSC: Max A (lift and lower assist);2-From toilet/BSC: Max A (lift and lower assist)  FIM - Press photographer Assistive Devices: Arm rests;Bed rails Bed/Chair Transfer: 2: Supine > Sit: Max A (lifting assist/Pt. 25-49%);2: Sit > Supine: Max A (lifting assist/Pt. 25-49%);2: Bed > Chair or W/C: Max A (lift and lower assist);2: Chair or W/C > Bed: Max A (lift and lower assist)  FIM - Locomotion: Wheelchair Distance: 150 Locomotion: Wheelchair: 1: Total Assistance/staff pushes wheelchair (Pt<25%) FIM - Locomotion: Ambulation Locomotion: Ambulation Assistive Devices: Other (comment) (2 person arm over arm) Ambulation/Gait Assistance: 1: +2 Total assist Locomotion: Ambulation: 0: Activity did not occur  Comprehension Comprehension Mode: Auditory Comprehension: 3-Understands basic 50 - 74% of the time/requires cueing 25 - 50%  of the time  Expression Expression Mode: Verbal Expression: 3-Expresses basic 50 - 74% of the time/requires cueing 25 - 50% of the time. Needs to repeat parts of sentences.  Social Interaction Social Interaction: 3-Interacts appropriately 50 - 74% of the time - May be physically or verbally inappropriate.  Problem Solving Problem Solving: 2-Solves basic 25 - 49% of the time - needs direction more than half the time to initiate, plan or complete simple activities  Memory Memory: 3-Recognizes or recalls 50 - 74% of  the time/requires cueing 25 - 49% of the time  Medical Problem List and Plan:  1. 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 DVT Prophylaxis/Anticoagulation: Eliquis  3. Pain Management: Tylenol as needed  4. Neuropsych: This patient is not capable of making decisions on his/her own behalf.   -ritalin for arousal and attention  -antidepressant 5. Dysphasia. Dysphagia 2 nectar thick liquids. Monitor for any signs of aspiration. Followup speech therapy  -encourage adequate PO intake  -hs IVF--trial off of IVF?---hold tonight and check bmet monday 6. PAF/CAD/ CABG. Cardiac rate controlled. Continue Eliquis as directed. Followup cardiology services as needed  7. Hyperlipidemia. Crestor  8. Glaucoma. Continue eyedrops  9. Irritable bowel syndrome.Amitiza twice a day and MiraLAX daily.  10. UTI completed Abx  LOS (Days) 15 A FACE TO FACE EVALUATION WAS PERFORMED  Teven Mittman T 03/28/2013, 8:56 AM

## 2013-03-28 NOTE — Progress Notes (Signed)
Occupational Therapy Session Note  Patient Details  Name: Frank Weeks MRN: [id number] Date of Birth: [birth year]  Today's Date: 03/28/2013 Time: 0865-7846 Time Calculation (min): 20 min  Skilled Therapeutic Interventions/Progress Updates:    Pt worked on self feeding in group setting using primarily the LUE.  Positioned the RUE on the table to help with postural alignment and control.  Pt able to self feed with supervision and occasional cueing to wipe the right side of his mouth.   Therapy Documentation Precautions:  Precautions Precautions: Fall Precaution Comments: expressive aphasia Restrictions Weight Bearing Restrictions: No Pain:  No report of pain during session ADL: See FIM for current functional status  Therapy/Group: Group Therapy  Leida Luton OTR/L 03/28/2013, 1:14 PM

## 2013-03-28 NOTE — Plan of Care (Signed)
Problem: RH BOWEL ELIMINATION Goal: RH STG MANAGE BOWEL WITH ASSISTANCE STG Manage Bowel with mod Assistance.  Outcome: Progressing Pt had continent bm yesterday per report

## 2013-03-29 ENCOUNTER — Inpatient Hospital Stay (HOSPITAL_COMMUNITY): Payer: Medicare Other | Admitting: Occupational Therapy

## 2013-03-29 MED ORDER — SODIUM CHLORIDE 0.9 % IV SOLN
INTRAVENOUS | Status: DC
  Administered 2013-03-30 – 2013-03-31 (×2): via INTRAVENOUS

## 2013-03-29 NOTE — Progress Notes (Signed)
Occupational Therapy Session Note  Patient Details  Name: Frank Weeks MRN: [id number] Date of Birth: 05/15/1929  Today's Date: 03/29/2013 Time: 4098-1191 Time Calculation (min): 55 min  Short Term Goals: Week 3:  OT Short Term Goal 1 (Week 3): Pt will complete UB dressing with min assist OT Short Term Goal 2 (Week 3): Pt will complete LB bathing with min assist OT Short Term Goal 3 (Week 3): Pt will complete LB dressing with mod assist OT Short Term Goal 4 (Week 3): Pt will complete shower transfer with max assist OT Short Term Goal 5 (Week 3): Pt will complete toileting with mod assist of one caregiver  Skilled Therapeutic Interventions/Progress Updates:    Pt seen for ADL retraining with focus on functional mobility, transfers, sit <> stand, and increased participation in self-care tasks. Pt in bed upon arrival, squat pivot transfer bed to w/c with pt reaching for arm rest without verbal cues this session. Engaged in bathing and dressing at sit <> stand level at sink with focus on weight shifting and trunk control with LB bathing as well as crossing LE over knee to increase ability to wash shins and feet. While bathing, pt conveyed need to toilet, performed stand pivot to [facility] with UE support on sink, however unable to void. Pt with increased standing tolerance this session, with ability to complete hygiene and pulling up pants with mod assist to maintain standing balance. Engaged in NM re-ed with PROM at shoulder and elbow, followed by pt return demonstrating self-ROM exercises with cues to guard shoulder joint and not perform quick, jerky movements.  Therapy Documentation Precautions:  Precautions Precautions: Fall Precaution Comments: expressive aphasia Restrictions Weight Bearing Restrictions: No Pain: Pain Assessment Pain Assessment: No/denies pain  See FIM for current functional status  Therapy/Group: Individual Therapy  Leonette Monarch 03/29/2013, 11:02 AM

## 2013-03-29 NOTE — Progress Notes (Signed)
Patient ID: Frank Weeks, male   DOB: [birth year], 79 y.o.   MRN: [id number] Subjective/Complaints: 79 y.o. right-handed male with history of CAD/CABG, PAF pacemaker, subarachnoid hemorrhage 50 years ago CVA. Admitted 03/08/2013 with fall reported by wife with right-sided weakness and slurred speech. Cranial CT showed developing low density left basal ganglia external capsule region consistent with acute infarct. Carotid Dopplers with no ICA stenosis. Echocardiogram with ejection fraction of 60% and normal systolic function. CT angiogram of the head showed a filling defect throughout the left M1 segment compatible with embolus causing acute infarct in left basal ganglia.  No problems reported. Slept well  Review of Systems  Unable to perform ROS: medical condition   Objective: Vital Signs: Blood pressure 116/76, pulse 72, temperature 98.2 F (36.8 C), temperature source Oral, resp. rate 18, height 5\' 8"  (1.727 m), weight 77.8 kg (171 lb 8.3 oz), SpO2 97.00%. No results found. No results found for this or any previous visit (from the past 72 hour(s)).      Assessment/Plan: 1. Functional deficits secondary to Left MCA distribution infarct which require 3+ hours per day of interdisciplinary therapy in a comprehensive inpatient rehab setting. Physiatrist is providing close team supervision and 24 hour management of active medical problems listed below. Physiatrist and rehab team continue to assess barriers to discharge/monitor patient progress toward functional and medical goals.  FIM: FIM - Bathing Bathing Steps Patient Completed: Chest;Right Arm;Abdomen;Front perineal area;Right upper leg;Left upper leg;Buttocks Bathing: 0: Activity did not occur  FIM - Upper Body Dressing/Undressing Upper body dressing/undressing steps patient completed: Thread/unthread left sleeve of pullover shirt/dress;Put head through opening of pull over shirt/dress Upper body dressing/undressing: 0: Activity did not  occur FIM - Lower Body Dressing/Undressing Lower body dressing/undressing steps patient completed: Pull underwear up/down;Pull pants up/down Lower body dressing/undressing: 0: Activity did not occur  FIM - Toileting Toileting steps completed by patient: Performs perineal hygiene;Adjust clothing after toileting Toileting: 0: Activity did not occur  FIM - Diplomatic Services operational officer Devices: Bedside commode Toilet Transfers: 0-Activity did not occur  FIM - Banker Devices: Arm rests;Bed rails Bed/Chair Transfer: 0: Activity did not occur  FIM - Locomotion: Wheelchair Distance: 150 Locomotion: Wheelchair: 0: Activity did not occur FIM - Locomotion: Ambulation Locomotion: Ambulation Assistive Devices: Chief Operating Officer Ambulation/Gait Assistance: 2: Max assist Locomotion: Ambulation: 0: Activity did not occur  Comprehension Comprehension Mode: Auditory Comprehension: 3-Understands basic 50 - 74% of the time/requires cueing 25 - 50%  of the time  Expression Expression Mode: Verbal Expression: 3-Expresses basic 50 - 74% of the time/requires cueing 25 - 50% of the time. Needs to repeat parts of sentences.  Social Interaction Social Interaction: 3-Interacts appropriately 50 - 74% of the time - May be physically or verbally inappropriate.  Problem Solving Problem Solving: 2-Solves basic 25 - 49% of the time - needs direction more than half the time to initiate, plan or complete simple activities  Memory Memory: 3-Recognizes or recalls 50 - 74% of the time/requires cueing 25 - 49% of the time  Medical Problem List and Plan:  1. 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 DVT Prophylaxis/Anticoagulation: Eliquis  3. Pain Management: Tylenol as needed  4. Neuropsych: This patient is not capable of making decisions on his/her own behalf.   -ritalin for arousal and attention  -antidepressant?? 5.  Dysphasia. Dysphagia 2 nectar thick liquids. Monitor for any signs of aspiration. Followup speech therapy  -encourage adequate  PO intake  -hs IVF--trial off of IVF?---holding for now-- check bmet monday 6. PAF/CAD/ CABG. Cardiac rate controlled. Continue Eliquis as directed. Followup cardiology services as needed  7. Hyperlipidemia. Crestor  8. Glaucoma. Continue eyedrops  9. Irritable bowel syndrome.Amitiza twice a day and MiraLAX daily.  10. UTI completed Abx  LOS (Days) 16 A FACE TO FACE EVALUATION WAS PERFORMED  Herny Scurlock T 03/29/2013, 8:47 AM

## 2013-03-29 NOTE — Progress Notes (Signed)
Red bump noted to patients right cheek bone by family. Patient denies any pain. Patient unable to verbalize when it happened. Patient vitals stable. Patient up at nurse station when in chair and bed alarm placed when in bed. No events have occurred. Continue to monitor.

## 2013-03-30 ENCOUNTER — Inpatient Hospital Stay (HOSPITAL_COMMUNITY): Payer: Medicare Other | Admitting: Speech Pathology

## 2013-03-30 ENCOUNTER — Encounter (HOSPITAL_COMMUNITY): Payer: Medicare Other

## 2013-03-30 ENCOUNTER — Inpatient Hospital Stay (HOSPITAL_COMMUNITY): Payer: Medicare Other | Admitting: Occupational Therapy

## 2013-03-30 ENCOUNTER — Inpatient Hospital Stay (HOSPITAL_COMMUNITY): Payer: Medicare Other | Admitting: Physical Therapy

## 2013-03-30 DIAGNOSIS — I4891 Unspecified atrial fibrillation: Secondary | ICD-10-CM

## 2013-03-30 DIAGNOSIS — I69991 Dysphagia following unspecified cerebrovascular disease: Secondary | ICD-10-CM

## 2013-03-30 DIAGNOSIS — I6340 Cerebral infarction due to embolism of unspecified cerebral artery: Secondary | ICD-10-CM

## 2013-03-30 DIAGNOSIS — G8110 Spastic hemiplegia affecting unspecified side: Secondary | ICD-10-CM

## 2013-03-30 LAB — CBC
MCHC: 34.5 g/dL (ref 30.0–36.0)
MCV: 94 fL (ref 78.0–100.0)
Platelets: 229 10*3/uL (ref 150–400)
RDW: 13.3 % (ref 11.5–15.5)
WBC: 9.1 10*3/uL (ref 4.0–10.5)

## 2013-03-30 LAB — BASIC METABOLIC PANEL
BUN: 12 mg/dL (ref 6–23)
CO2: 27 mEq/L (ref 19–32)
Calcium: 9.3 mg/dL (ref 8.4–10.5)
Creatinine, Ser: 0.76 mg/dL (ref 0.50–1.35)

## 2013-03-30 NOTE — Progress Notes (Signed)
Physical Therapy Session Note  Patient Details  Name: Frank Weeks MRN: [id number] Date of Birth: 1929/08/19  Today's Date: 03/30/2013 Time: 4098-1191 Time Calculation (min): 51 min  Short Term Goals: Week 2:  PT Short Term Goal 1 (Week 2): Patient will be able to perform bed mobility with mod-Assist. PT Short Term Goal 1 - Progress (Week 2): Progressing toward goal PT Short Term Goal 2 (Week 2): Patient will be able to perform transfers with Mod-Assist. PT Short Term Goal 2 - Progress (Week 2): Progressing toward goal PT Short Term Goal 3 (Week 2): Patient will be able to ambulate 10' using LRAD with Max-Assist  PT Short Term Goal 3 - Progress (Week 2): Met PT Short Term Goal 4 (Week 2): Patient will be able to maintain dynamic sitting balance with Min-assist PT Short Term Goal 4 - Progress (Week 2): Met PT Short Term Goal 5 (Week 2): Patient will participate in w/c mobility with hemi technique with max A x 25' PT Short Term Goal 5 - Progress (Week 2): Met Week 3:  PT Short Term Goal 1 (Week 3): Pt will perform bed mobility with bed rail and mod A PT Short Term Goal 2 (Week 3): Pt will perform bed <> w/c consistently to L and R mod A PT Short Term Goal 3 (Week 3): Pt will peform w/c mobility in controlled environment x 150' supervision PT Short Term Goal 4 (Week 3): Pt will perform gait in controlled environment with platform RW and orthosis x 50' and max A of one person   Therapy Documentation Precautions:  Precautions Precautions: Fall Precaution Comments: expressive aphasia Restrictions Weight Bearing Restrictions: No Vital Signs: Therapy Vitals Temp: 97.9 F (36.6 C) Temp src: Oral Pulse Rate: 70 Resp: 17 BP: 129/76 mmHg Patient Position, if appropriate: Sitting Oxygen Therapy SpO2: 97 % O2 Device: None (Room air) Pain: Pain Assessment Pain Assessment: No/denies pain Mobility:  Pt up in w/c at beginning of session; requesting to return to bed at end of session.   Performed stand pivot w/c > bed with max A for lifting, lowering and lateral weight shifting in order to pivot.  Performed sit > supine with mod A to lift RLE onto bed and slowly lower trunk.   Locomotion : Ambulation Ambulation/Gait Assistance: 1: +2 Total assist Wheelchair Mobility Distance: 150  Other Treatments: Treatments Neuromuscular Facilitation: Right;Lower Extremity;Forced use;Activity to increase coordination;Activity to increase motor control;Activity to increase timing and sequencing;Activity to increase sustained activation;Activity to increase lateral weight shifting;Activity to increase anterior-posterior weight shifting during gait training with R toe off [place] orthosis and L hemi walker x 25' with +2 assistance for safety; therapist providing verbal and tactile cues for full L lateral weight shifting vs. L lateral lean and assistance for full clearance and advancement of RLE and RLE extension for stabilization during stance.  Continued NMR with UE support on L stair rail during lateral weight shifts and alternating R and L foot taps to 4" step with mod-max A overall.    See FIM for current functional status  Therapy/Group: Individual Therapy  [name] [facility] 03/30/2013, 5:05 PM

## 2013-03-30 NOTE — Consult Note (Signed)
NEUROCOGNITIVE STATUS EXAMINATION - [facility]   [name] is an 79 year old, right-handed, married, Caucasian man, who was referred for a neurocognitive status examination to assess his emotional state and mental status post-stroke.  According to his medical record, he was admitted on 03/08/13 following a fall with right-sided weakness and slurred speech.  Cranial CT demonstrated developing low density left basal ganglia external capsule region acute infarct.  He did not receive TPA.  His history is also reportedly significant for prior stroke several years ago and aneurysm at age 61.  According to his treatment team, he has begun reporting low mood in response to his physical and cognitive deficits.  [name] was previously evaluated by Orie Fisherman, PsyD. for a neurobehavioral status exam.  In brief, results from that evaluation revealed markedly impaired overall mental status, which seemed to be adversely impacted by both expressive and receptive aphasia, and which was observed to be nonfluent.  There was also reference to possible depression and anxiety secondary to adjusting to his current life situation.  [name] was referred for follow-up evaluation today to re-assess mental status to evaluated potential interval change and to provide ego support.    Emotional Functioning:  [name] total scores on measures of mood symptoms were not suggestive of significant depression or anxiety at this time.  In conversation, he denied feeling depressed and indicated that he did not have concerns, though it is notable that when we discussed timeline for recovery, he fidgeted in his seat and had a grimaced expression on his face.  His wife mentioned that it is difficult for him to hear about the lengthy process of recovery.  Suicidal ideation was denied.  [name] affect appeared generally positive, despite his significant communication difficulties.    Mental Status:   [name] total score on an overall measure of mental status was significantly impaired, at the level of dementia, but was again, significantly adversely impacted by expressive language difficulties.  When provided with multiple choices to provide him an opportunity to respond by nodding instead of saying a word, [name] was able to correctly identify the day of the week, season, state, city, and name of the hospital in which he was being seen.  He remained disoriented to month, date, and county.  Behaviorally, [name] speech was observed to be nonfluent and unintelligible.  He was unable to repeat or generate words.  His receptive language skills appeared intact, as he responded appropriately to all prompts.    Impressions and Recommendations:  [name] seems to be experiencing a circumscribed language deficit, which is expected and consistent with the location of his stroke.  He continues to demonstrate expressive speech aphasia, but his receptive abilities appear intact.  [name] responded well when questions and prompts were phrased in a yes/no format so that he could respond by nodding.  I also encouraged him to use gestures with his left hand, whenever possible, to improve his ability to communicate.  [name] may be able to type with his left hand to convey his thoughts.  Given this possibility, his wife may want to bring a laptop or iPad to the hospital to try this method of communication.  [name] cognitive abilities should continue to be monitored to assess for interval change.  With this in mind, it would be beneficial for him to complete a comprehensive neuropsychological evaluation in 1 year and, depending on his length of stay on the current unit,  one more brief assessment may be warranted.    DIAGNOSES: Stroke syndrome  Leavy Cella, Psy.D.  Clinical Neuropsychologist

## 2013-03-30 NOTE — Progress Notes (Signed)
Speech Language Pathology Daily Session Note  Patient Details  Name: Frank Weeks MRN: [id number] Date of Birth: 1929-09-22  Today's Date: 03/30/2013 Time: 1415-1500 Time Calculation (min): 45 min  Short Term Goals: Week 3: SLP Short Term Goal 1 (Week 3): Patient will perform oral motor and pharngeal strengthening exercises with Min assist multimodal cueing  SLP Short Term Goal 2 (Week 3): Patient will answer yes/no questions with 90% accuracy. SLP Short Term Goal 3 (Week 3): Patient will name basic ADL objects with Mod assist semantic and phonemic cues. SLP Short Term Goal 4 (Week 3): Patient will self-monitor and correct errors with Mod assist clinician cues. SLP Short Term Goal 6 (Week 3): Patient will consume thin liquids via cup with Min assist clinician cues to prevent overt s/s of aspiration.  Skilled Therapeutic Interventions: Skilled treatment session focused on addressing dysphagia and language goals.  SLP facilitated session with lunch of Dys.3 textures and Nectar-thick liquids with Min assist verbal cues to self-monitor and correct right-sided anterior loss and pocketing.  SLP also facilitated session with thin liquids cup sips with delayed cough response in 1/7 trials.  Recommend SLP observe patient with meal of Dys.3 textures and thin liquids tomorrow prior to diet upgrade.  SLP also facilitated session with Mod assist semantic and written cues to accurately name 1 and 2 syllable common objects.  Patient required Max assist verbal and visual cues to self-monitor verbal errors. Wife present for session today and SLP initiated education regarding dysphagia and aphasia.    FIM:  Comprehension Comprehension Mode: Auditory Comprehension: 4-Understands basic 75 - 89% of the time/requires cueing 10 - 24% of the time Expression Expression Mode: Verbal Expression: 2-Expresses basic 25 - 49% of the time/requires cueing 50 - 75% of the time. Uses single words/gestures. Social  Interaction Social Interaction: 4-Interacts appropriately 75 - 89% of the time - Needs redirection for appropriate language or to initiate interaction. Problem Solving Problem Solving: 3-Solves basic 50 - 74% of the time/requires cueing 25 - 49% of the time Memory Memory: 3-Recognizes or recalls 50 - 74% of the time/requires cueing 25 - 49% of the time FIM - Eating Eating Activity: 4: Helper checks for pocketed food  Pain Pain Assessment Pain Assessment: No/denies pain  Therapy/Group: Individual Therapy  Charlane Ferretti., CCC-SLP [phone]  [name] 03/30/2013, 4:27 PM

## 2013-03-30 NOTE — Progress Notes (Signed)
Social Work Patient ID: Frank Weeks, male   DOB: [birth year], 79 y.o.   MRN: [id number] Met with pt's wife she reports: " He is feeling better today and doing better."  She is upbeat regarding his progress and the insurance allowing him to stay here another week. Continue to work on discharge plan and see progress this week.

## 2013-03-30 NOTE — Progress Notes (Signed)
SLP Cancellation Note  Patient Details Name: Frank Weeks MRN: [id number] DOB: Apr 01, 1929   Cancelled treatment:        Patient missed 3 minutes of skilled therapy ([facility]) due to refusal to participate.   Maxcine Ham 03/30/2013, 3:24 PM  Maxcine Ham, M.A. CCC-SLP

## 2013-03-30 NOTE — Progress Notes (Signed)
Patient ID: Frank Weeks, male   DOB: [birth year], 79 y.o.   MRN: [id number] Subjective/Complaints: 78 y.o. right-handed male with history of CAD/CABG, PAF pacemaker, subarachnoid hemorrhage 50 years ago CVA. Admitted 03/08/2013 with fall reported by wife with right-sided weakness and slurred speech. Cranial CT showed developing low density left basal ganglia external capsule region consistent with acute infarct. Carotid Dopplers with no ICA stenosis. Echocardiogram with ejection fraction of 60% and normal systolic function. CT angiogram of the head showed a filling defect throughout the left M1 segment compatible with embolus causing acute infarct in left basal ganglia.  Smiling, remains severely aphasic  Review of Systems  Unable to perform ROS: medical condition   Objective: Vital Signs: Blood pressure 109/74, pulse 70, temperature 97.8 F (36.6 C), temperature source Oral, resp. rate 17, height 5\' 8"  (1.727 m), weight 77.8 kg (171 lb 8.3 oz), SpO2 97.00%. No results found. Results for orders placed during the hospital encounter of 03/13/13 (from the past 72 hour(s))  BASIC METABOLIC PANEL     Status: Abnormal   Collection Time    03/30/13  5:20 AM      Result Value Range   Sodium 137  135 - 145 mEq/L   Potassium 4.1  3.5 - 5.1 mEq/L   Chloride 104  96 - 112 mEq/L   CO2 27  19 - 32 mEq/L   Glucose, Bld 109 (*) 70 - 99 mg/dL   BUN 12  6 - 23 mg/dL   Creatinine, Ser 1.30  0.50 - 1.35 mg/dL   Calcium 9.3  8.4 - 86.5 mg/dL   GFR calc non Af Amer 83 (*) >90 mL/min   GFR calc Af Amer >90  >90 mL/min   Comment:            The eGFR has been calculated     using the CKD EPI equation.     This calculation has not been     validated in all clinical     situations.     eGFR's persistently     <90 mL/min signify     possible Chronic Kidney Disease.  CBC     Status: None   Collection Time    03/30/13  5:20 AM      Result Value Range   WBC 9.1  4.0 - 10.5 K/uL   RBC 4.50  4.22 - 5.81  MIL/uL   Hemoglobin 14.6  13.0 - 17.0 g/dL   HCT 78.4  69.6 - 29.5 %   MCV 94.0  78.0 - 100.0 fL   MCH 32.4  26.0 - 34.0 pg   MCHC 34.5  30.0 - 36.0 g/dL   RDW 28.4  13.2 - 44.0 %   Platelets 229  150 - 400 K/uL    Labs reviewed normal    Assessment/Plan: 1. Functional deficits secondary to Left MCA distribution infarct which require 3+ hours per day of interdisciplinary therapy in a comprehensive inpatient rehab setting. Physiatrist is providing close team supervision and 24 hour management of active medical problems listed below. Physiatrist and rehab team continue to assess barriers to discharge/monitor patient progress toward functional and medical goals.  FIM: FIM - Bathing Bathing Steps Patient Completed: Chest;Right Arm;Abdomen;Front perineal area;Right upper leg;Left upper leg;Buttocks;Right lower leg (including foot);Left lower leg (including foot) Bathing: 4: Min-Patient completes 8-9 39f 10 parts or 75+ percent  FIM - Upper Body Dressing/Undressing Upper body dressing/undressing steps patient completed: Thread/unthread right sleeve of pullover shirt/dresss;Thread/unthread left sleeve of pullover  shirt/dress;Pull shirt over trunk Upper body dressing/undressing: 3: Mod-Patient completed 50-74% of tasks FIM - Lower Body Dressing/Undressing Lower body dressing/undressing steps patient completed: Pull underwear up/down;Pull pants up/down;Thread/unthread left pants leg Lower body dressing/undressing: 2: Max-Patient completed 25-49% of tasks  FIM - Toileting Toileting steps completed by patient: Performs perineal hygiene;Adjust clothing after toileting Toileting: 0: Activity did not occur  FIM - Diplomatic Services operational officer Devices: Bedside commode Toilet Transfers: 0-Activity did not occur  FIM - Banker Devices: Arm rests;Bed rails Bed/Chair Transfer: 0: Activity did not occur  FIM - Locomotion:  Wheelchair Distance: 150 Locomotion: Wheelchair: 0: Activity did not occur FIM - Locomotion: Ambulation Locomotion: Ambulation Assistive Devices: Chief Operating Officer Ambulation/Gait Assistance: 2: Max assist Locomotion: Ambulation: 0: Activity did not occur  Comprehension Comprehension Mode: Auditory Comprehension: 3-Understands basic 50 - 74% of the time/requires cueing 25 - 50%  of the time  Expression Expression Mode: Verbal Expression: 3-Expresses basic 50 - 74% of the time/requires cueing 25 - 50% of the time. Needs to repeat parts of sentences.  Social Interaction Social Interaction: 3-Interacts appropriately 50 - 74% of the time - May be physically or verbally inappropriate.  Problem Solving Problem Solving: 2-Solves basic 25 - 49% of the time - needs direction more than half the time to initiate, plan or complete simple activities  Memory Memory: 3-Recognizes or recalls 50 - 74% of the time/requires cueing 25 - 49% of the time  Medical Problem List and Plan:  1. 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 DVT Prophylaxis/Anticoagulation: Eliquis  3. Pain Management: Tylenol as needed  4. Neuropsych: This patient is not capable of making decisions on his/her own behalf.   -ritalin for arousal and attention  -antidepressant?? 5. Dysphasia. Dysphagia 2 nectar thick liquids. Monitor for any signs of aspiration. Followup speech therapy  -encourage adequate PO intake  -hs IVF--trial off of IVF?---holding for now-- check bmet monday 6. PAF/CAD/ CABG. Cardiac rate controlled. Continue Eliquis as directed. Followup cardiology services as needed  7. Hyperlipidemia. Crestor  8. Glaucoma. Continue eyedrops  9. Irritable bowel syndrome.Amitiza twice a day and MiraLAX daily.  10. UTI completed Abx  LOS (Days) 17 A FACE TO FACE EVALUATION WAS PERFORMED  Evva Din E 03/30/2013, 8:40 AM

## 2013-03-30 NOTE — Progress Notes (Signed)
Occupational Therapy Session Note  Patient Details  Name: Frank Weeks MRN: [id number] Date of Birth: 1928-10-29  Today's Date: 03/30/2013 Time: 4098-1191 Time Calculation (min): 57 min  Short Term Goals: Week 3:  OT Short Term Goal 1 (Week 3): Pt will complete UB dressing with min assist OT Short Term Goal 2 (Week 3): Pt will complete LB bathing with min assist OT Short Term Goal 3 (Week 3): Pt will complete LB dressing with mod assist OT Short Term Goal 4 (Week 3): Pt will complete shower transfer with max assist OT Short Term Goal 5 (Week 3): Pt will complete toileting with mod assist of one caregiver  Skilled Therapeutic Interventions/Progress Updates:    Pt seen for ADL retraining with focus on bed mobility, transfers, sit <> stand, and increased participation in self-care tasks.  Pt in bed asleep upon arrival, requiring increased time to fully arouse.  Engaged in perineal hygiene at bed level with focus on RLE placement to assist in rolling with pt requiring mod assist to roll to Lt and min to roll to Rt.  Stand pivot transfer to w/c this session with pt with increased trunk control and no buckling at Rt this with transfer this session.  Engaged in bathing and dressing at sit <> stand level at sink with focus on weight shifting and trunk control with LB bathing as well as crossing each LE over opposite knee to increase ability to wash shins and feet. Pt left seated in w/c with RN present to assist in full supervision of breakfast.  Therapy Documentation Precautions:  Precautions Precautions: Fall Precaution Comments: expressive aphasia Restrictions Weight Bearing Restrictions: No Pain: Pain Assessment Pain Assessment: Faces Pain Score: Asleep Faces Pain Scale: Hurts even more Pain Type: Acute pain Pain Location: Leg Pain Orientation: Left Pain Intervention(s): Medication (See eMAR);Emotional support;Repositioned  See FIM for current functional status  Therapy/Group:  Individual Therapy  Leonette Monarch 03/30/2013, 10:00 AM

## 2013-03-31 ENCOUNTER — Inpatient Hospital Stay (HOSPITAL_COMMUNITY): Payer: Medicare Other | Admitting: Speech Pathology

## 2013-03-31 ENCOUNTER — Inpatient Hospital Stay (HOSPITAL_COMMUNITY): Payer: Medicare Other | Admitting: Occupational Therapy

## 2013-03-31 ENCOUNTER — Inpatient Hospital Stay (HOSPITAL_COMMUNITY): Payer: Medicare Other | Admitting: Physical Therapy

## 2013-03-31 NOTE — Progress Notes (Signed)
Patient ID: Frank Weeks, male   DOB: 01/22/1929, 79 y.o.   MRN: [id number] Subjective/Complaints: 79 y.o. right-handed male with history of CAD/CABG, PAF pacemaker, subarachnoid hemorrhage 50 years ago CVA. Admitted 03/08/2013 with fall reported by wife with right-sided weakness and slurred speech. Cranial CT showed developing low density left basal ganglia external capsule region consistent with acute infarct. Carotid Dopplers with no ICA stenosis. Echocardiogram with ejection fraction of 60% and normal systolic function. CT angiogram of the head showed a filling defect throughout the left M1 segment compatible with embolus causing acute infarct in left basal ganglia.  Awakens to voice, aphasic  Review of Systems  Unable to perform ROS: medical condition   Objective: Vital Signs: Blood pressure 119/69, pulse 70, temperature 97.4 F (36.3 C), temperature source Oral, resp. rate 17, height 5\' 8"  (1.727 m), weight 77.8 kg (171 lb 8.3 oz), SpO2 97.00%. No results found. Results for orders placed during the hospital encounter of 03/13/13 (from the past 72 hour(s))  BASIC METABOLIC PANEL     Status: Abnormal   Collection Time    03/30/13  5:20 AM      Result Value Range   Sodium 137  135 - 145 mEq/L   Potassium 4.1  3.5 - 5.1 mEq/L   Chloride 104  96 - 112 mEq/L   CO2 27  19 - 32 mEq/L   Glucose, Bld 109 (*) 70 - 99 mg/dL   BUN 12  6 - 23 mg/dL   Creatinine, Ser 4.09  0.50 - 1.35 mg/dL   Calcium 9.3  8.4 - 81.1 mg/dL   GFR calc non Af Amer 83 (*) >90 mL/min   GFR calc Af Amer >90  >90 mL/min   Comment:            The eGFR has been calculated     using the CKD EPI equation.     This calculation has not been     validated in all clinical     situations.     eGFR's persistently     <90 mL/min signify     possible Chronic Kidney Disease.  CBC     Status: None   Collection Time    03/30/13  5:20 AM      Result Value Range   WBC 9.1  4.0 - 10.5 K/uL   RBC 4.50  4.22 - 5.81 MIL/uL    Hemoglobin 14.6  13.0 - 17.0 g/dL   HCT 91.4  78.2 - 95.6 %   MCV 94.0  78.0 - 100.0 fL   MCH 32.4  26.0 - 34.0 pg   MCHC 34.5  30.0 - 36.0 g/dL   RDW 21.3  08.6 - 57.8 %   Platelets 229  150 - 400 K/uL    Labs reviewed normal    Assessment/Plan: 1. Functional deficits secondary to Left MCA distribution infarct which require 3+ hours per day of interdisciplinary therapy in a comprehensive inpatient rehab setting. Physiatrist is providing close team supervision and 24 hour management of active medical problems listed below. Physiatrist and rehab team continue to assess barriers to discharge/monitor patient progress toward functional and medical goals.  FIM: FIM - Bathing Bathing Steps Patient Completed: Chest;Right Arm;Abdomen;Front perineal area;Right upper leg;Left upper leg;Buttocks;Right lower leg (including foot);Left lower leg (including foot) Bathing: 4: Min-Patient completes 8-9 54f 10 parts or 75+ percent  FIM - Upper Body Dressing/Undressing Upper body dressing/undressing steps patient completed: Thread/unthread right sleeve of pullover shirt/dresss;Thread/unthread left sleeve of pullover  shirt/dress;Pull shirt over trunk Upper body dressing/undressing: 4: Min-Patient completed 75 plus % of tasks FIM - Lower Body Dressing/Undressing Lower body dressing/undressing steps patient completed: Pull pants up/down;Thread/unthread left pants leg Lower body dressing/undressing: 1: Total-Patient completed less than 25% of tasks  FIM - Toileting Toileting steps completed by patient: Performs perineal hygiene;Adjust clothing after toileting Toileting: 0: Activity did not occur  FIM - Diplomatic Services operational officer Devices: Bedside commode Toilet Transfers: 1-Two helpers  FIM - Banker Devices: Arm rests Bed/Chair Transfer: 3: Sit > Supine: Mod A (lifting assist/Pt. 50-74%/lift 2 legs);2: Chair or W/C > Bed: Max A (lift and lower  assist)  FIM - Locomotion: Wheelchair Distance: 150 Locomotion: Wheelchair: 1: Total Assistance/staff pushes wheelchair (Pt<25%) FIM - Locomotion: Ambulation Locomotion: Ambulation Assistive Devices: Walker - Hemi;Orthosis Ambulation/Gait Assistance: 1: +2 Total assist Locomotion: Ambulation: 1: Two helpers  Comprehension Comprehension Mode: Auditory Comprehension: 4-Understands basic 75 - 89% of the time/requires cueing 10 - 24% of the time  Expression Expression Mode: Verbal Expression: 2-Expresses basic 25 - 49% of the time/requires cueing 50 - 75% of the time. Uses single words/gestures.  Social Interaction Social Interaction: 4-Interacts appropriately 75 - 89% of the time - Needs redirection for appropriate language or to initiate interaction.  Problem Solving Problem Solving: 3-Solves basic 50 - 74% of the time/requires cueing 25 - 49% of the time  Memory Memory: 3-Recognizes or recalls 50 - 74% of the time/requires cueing 25 - 49% of the time  Medical Problem List and Plan:  1. 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 DVT Prophylaxis/Anticoagulation: Eliquis  3. Pain Management: Tylenol as needed  4. Neuropsych: This patient is not capable of making decisions on his/her own behalf.   -ritalin for arousal and attention  -antidepressant?? 5. Dysphasia. Dysphagia 2 nectar thick liquids. Monitor for any signs of aspiration. Followup speech therapy  -encourage adequate PO intake  -hs IVF--trial off of IVF?---holding for now-- check bmet monday 6. PAF/CAD/ CABG. Cardiac rate controlled. Continue Eliquis as directed. Followup cardiology services as needed  7. Hyperlipidemia. Crestor  8. Glaucoma. Continue eyedrops  9. Irritable bowel syndrome.Amitiza twice a day and MiraLAX daily.  10. UTI completed Abx  LOS (Days) 18 A FACE TO FACE EVALUATION WAS PERFORMED  Annalisa Colonna E 03/31/2013, 8:42 AM

## 2013-03-31 NOTE — Progress Notes (Signed)
Speech Language Pathology Daily Session Note  Patient Details  Name: Frank Weeks MRN: [id number] Date of Birth: 1928-10-20  Today's Date: 03/31/2013 Time: 1305-1350 Time Calculation (min): 45 min  Short Term Goals: Week 3: SLP Short Term Goal 1 (Week 3): Patient will perform oral motor and pharngeal strengthening exercises with Min assist multimodal cueing  SLP Short Term Goal 2 (Week 3): Patient will answer yes/no questions with 90% accuracy. SLP Short Term Goal 3 (Week 3): Patient will name basic ADL objects with Mod assist semantic and phonemic cues. SLP Short Term Goal 4 (Week 3): Patient will self-monitor and correct errors with Mod assist clinician cues. SLP Short Term Goal 6 (Week 3): Patient will consume thin liquids via cup with Min assist clinician cues to prevent overt s/s of aspiration.  Skilled Therapeutic Interventions: Skilled treatment session focused on addressing dysphagia goals and family education.  SLP facilitated session with lunch of Dys.3 textures and thin liquids with Mod assist verbal cues to self-monitor and correct right-sided anterior loss and to consume small single sips via cup.  Patient demonstrated weak cough response in 1/10 sips during meal; clinician cues for hard effortful throat clears appeared to reduce suspected penetrates.    Wife present for session today and SLP verbally provided education regarding diet upgrade recommendations for thin liquids with Dys.3 textures and continued full supervision with all p.o.   FIM:  Comprehension Comprehension Mode: Auditory Comprehension: 4-Understands basic 75 - 89% of the time/requires cueing 10 - 24% of the time Expression Expression Mode: Verbal Expression: 2-Expresses basic 25 - 49% of the time/requires cueing 50 - 75% of the time. Uses single words/gestures. Social Interaction Social Interaction: 4-Interacts appropriately 75 - 89% of the time - Needs redirection for appropriate language or to initiate  interaction. Problem Solving Problem Solving: 3-Solves basic 50 - 74% of the time/requires cueing 25 - 49% of the time Memory Memory: 3-Recognizes or recalls 50 - 74% of the time/requires cueing 25 - 49% of the time FIM - Eating Eating Activity: 4: Helper checks for pocketed food  Pain Pain Assessment Pain Assessment: No/denies pain  Therapy/Group: Individual Therapy  Charlane Ferretti., CCC-SLP [phone]  Quanasia Defino 03/31/2013, 4:34 PM

## 2013-03-31 NOTE — Progress Notes (Signed)
Physical Therapy Session Note  Patient Details  Name: Frank Weeks MRN: [id number] Date of Birth: 04/27/1929  Today's Date: 03/31/2013 Time: 4098-1191 Time Calculation (min): 49 min  Short Term Goals: Week 3:  PT Short Term Goal 1 (Week 3): Pt will perform bed mobility with bed rail and mod A PT Short Term Goal 2 (Week 3): Pt will perform bed <> w/c consistently to L and R mod A PT Short Term Goal 3 (Week 3): Pt will peform w/c mobility in controlled environment x 150' supervision PT Short Term Goal 4 (Week 3): Pt will perform gait in controlled environment with platform RW and orthosis x 50' and max A of one person  Skilled Therapeutic Interventions/Progress Updates:   Pt resting on mat following OT session. Performed rolling to L and placed RLE on powder board.  With mirror as visual feedback pt performed AAROM in gravity minimized position of RLE hip flexion <> extension, pelvic anterior and posterior rotation and knee flexion <> extension with tactile cues on mm belly for initiation and sustained activation.  Performed bed mobility training with focus on upper trunk flexion and rotation across midline, UE protraction during reaching across midline and lower trunk rotation for rolling to R and L in flat bed, no rail progressing from min-mod A to supervision.  From L side performed side > sit wth mod-max A for lifting assistance and tactile cues for pelvic depression and trunk elongation on R side.  Performed transfer mat > w/c > bed with stand pivot max A.  Pt extremely fatigued from back to back therapies and requesting to return to bed.  On bed performed sit > supine with min-mod A.  Pt able to bridge for hip repositioning.    Therapy Documentation Precautions:  Precautions Precautions: Fall Precaution Comments: expressive aphasia Restrictions Weight Bearing Restrictions: No General: Amount of Missed PT Time (min): 11 Minutes Missed Time Reason: Patient fatigue Vital Signs: Therapy  Vitals Temp: 97.8 F (36.6 C) Temp src: Oral Pulse Rate: 66 Resp: 18 BP: 117/78 mmHg Patient Position, if appropriate: Lying Oxygen Therapy SpO2: 98 % O2 Device: None (Room air) Pain: Pain Assessment Pain Assessment: No/denies pain   See FIM for current functional status  Therapy/Group: Individual Therapy  [name] [facility] 03/31/2013, 4:58 PM

## 2013-03-31 NOTE — Progress Notes (Signed)
Occupational Therapy Session Note  Patient Details  Name: Frank Weeks MRN: [id number] Date of Birth: 1929-01-27  Today's Date: 03/31/2013 Time: 7829-5621 and 1400-1433 Time Calculation (min): 60 min and 33 min  Short Term Goals: Week 3:  OT Short Term Goal 1 (Week 3): Pt will complete UB dressing with min assist OT Short Term Goal 2 (Week 3): Pt will complete LB bathing with min assist OT Short Term Goal 3 (Week 3): Pt will complete LB dressing with mod assist OT Short Term Goal 4 (Week 3): Pt will complete shower transfer with max assist OT Short Term Goal 5 (Week 3): Pt will complete toileting with mod assist of one caregiver  Skilled Therapeutic Interventions/Progress Updates:    1) Pt seen for ADL retraining with focus on bed mobility, transfers, sit <> stand, and increased participation in self-care tasks with focus on hemi-dressing technique.  Pt refused bathing this session, however willing to get OOB and complete dressing tasks.  Focus on education with hemi-dressing technique with LB dressing.  Pt able to don Rt pullup and pant with setup of crossing Rt leg over Lt knee.  Pt tolerating increased trunk rotation and weight shifting during self-care.  Engaged in scooting at edge of mat, trunk rotation, and bridging with focus on coordination of whole body movements.  Also incorporated shoulder PROM in supine with no muscle activation felt.   2) Pt seen for 1:1 OT with focus on NM re-ed in weight bearing in sitting and sidelying.  Weight bearing in unsupported sitting with Rt shoulder in external rotation followed by noted slight active movement in adduction when pt's weight biased towards Rt side.  Min assist provided for sitting balance when reaching outside BOS.  Weightbearing through Rt elbow and shoulder in sidelying with focus on increased challenge and trunk control.    Therapy Documentation Precautions:  Precautions Precautions: Fall Precaution Comments: expressive  aphasia Restrictions Weight Bearing Restrictions: No Pain:  Pt with no c/o pain this session.  See FIM for current functional status  Therapy/Group: Individual Therapy  Leonette Monarch 03/31/2013, 11:27 AM

## 2013-04-01 ENCOUNTER — Inpatient Hospital Stay (HOSPITAL_COMMUNITY): Payer: Medicare Other | Admitting: Occupational Therapy

## 2013-04-01 ENCOUNTER — Inpatient Hospital Stay (HOSPITAL_COMMUNITY): Payer: Medicare Other | Admitting: Speech Pathology

## 2013-04-01 ENCOUNTER — Inpatient Hospital Stay (HOSPITAL_COMMUNITY): Payer: Medicare Other | Admitting: Physical Therapy

## 2013-04-01 ENCOUNTER — Inpatient Hospital Stay (HOSPITAL_COMMUNITY): Payer: Medicare Other

## 2013-04-01 LAB — CBC
Hemoglobin: 13.5 g/dL (ref 13.0–17.0)
MCHC: 33.8 g/dL (ref 30.0–36.0)
Platelets: 198 10*3/uL (ref 150–400)
RDW: 13.3 % (ref 11.5–15.5)

## 2013-04-01 MED ORDER — SENNOSIDES-DOCUSATE SODIUM 8.6-50 MG PO TABS
2.0000 | ORAL_TABLET | Freq: Two times a day (BID) | ORAL | Status: DC
  Administered 2013-04-01 – 2013-04-05 (×8): 2 via ORAL
  Filled 2013-04-01 (×7): qty 2

## 2013-04-01 NOTE — Progress Notes (Signed)
Patient ID: Frank Weeks, male   DOB: 07-Jan-1929, 79 y.o.   MRN: [id number] Subjective/Complaints: 79 y.o. right-handed male with history of CAD/CABG, PAF pacemaker, subarachnoid hemorrhage 50 years ago CVA. Admitted 03/08/2013 with fall reported by wife with right-sided weakness and slurred speech. Cranial CT showed developing low density left basal ganglia external capsule region consistent with acute infarct. Carotid Dopplers with no ICA stenosis. Echocardiogram with ejection fraction of 60% and normal systolic function. CT angiogram of the head showed a filling defect throughout the left M1 segment compatible with embolus causing acute infarct in left basal ganglia.  Awakens to voice, aphasic No discomfort noted. Appetite improved Review of Systems  Unable to perform ROS: medical condition   Objective: Vital Signs: Blood pressure 110/72, pulse 70, temperature 97.4 F (36.3 C), temperature source Oral, resp. rate 18, height 5\' 8"  (1.727 m), weight 77.8 kg (171 lb 8.3 oz), SpO2 98.00%. No results found. Results for orders placed during the hospital encounter of 03/13/13 (from the past 72 hour(s))  BASIC METABOLIC PANEL     Status: Abnormal   Collection Time    03/30/13  5:20 AM      Result Value Range   Sodium 137  135 - 145 mEq/L   Potassium 4.1  3.5 - 5.1 mEq/L   Chloride 104  96 - 112 mEq/L   CO2 27  19 - 32 mEq/L   Glucose, Bld 109 (*) 70 - 99 mg/dL   BUN 12  6 - 23 mg/dL   Creatinine, Ser 4.09  0.50 - 1.35 mg/dL   Calcium 9.3  8.4 - 81.1 mg/dL   GFR calc non Af Amer 83 (*) >90 mL/min   GFR calc Af Amer >90  >90 mL/min   Comment:            The eGFR has been calculated     using the CKD EPI equation.     This calculation has not been     validated in all clinical     situations.     eGFR's persistently     <90 mL/min signify     possible Chronic Kidney Disease.  CBC     Status: None   Collection Time    03/30/13  5:20 AM      Result Value Range   WBC 9.1  4.0 - 10.5 K/uL    RBC 4.50  4.22 - 5.81 MIL/uL   Hemoglobin 14.6  13.0 - 17.0 g/dL   HCT 91.4  78.2 - 95.6 %   MCV 94.0  78.0 - 100.0 fL   MCH 32.4  26.0 - 34.0 pg   MCHC 34.5  30.0 - 36.0 g/dL   RDW 21.3  08.6 - 57.8 %   Platelets 229  150 - 400 K/uL  CBC     Status: None   Collection Time    04/01/13  6:20 AM      Result Value Range   WBC 7.6  4.0 - 10.5 K/uL   RBC 4.23  4.22 - 5.81 MIL/uL   Hemoglobin 13.5  13.0 - 17.0 g/dL   HCT 46.9  62.9 - 52.8 %   MCV 94.6  78.0 - 100.0 fL   MCH 31.9  26.0 - 34.0 pg   MCHC 33.8  30.0 - 36.0 g/dL   RDW 41.3  24.4 - 01.0 %   Platelets 198  150 - 400 K/uL    Labs reviewed normal    Assessment/Plan: 1. Functional deficits  secondary to Left MCA distribution infarct which require 3+ hours per day of interdisciplinary therapy in a comprehensive inpatient rehab setting. Physiatrist is providing close team supervision and 24 hour management of active medical problems listed below. Physiatrist and rehab team continue to assess barriers to discharge/monitor patient progress toward functional and medical goals.  FIM: FIM - Bathing Bathing Steps Patient Completed: Chest;Right Arm;Abdomen;Front perineal area;Right upper leg;Left upper leg;Buttocks;Right lower leg (including foot);Left lower leg (including foot) Bathing: 4: Min-Patient completes 8-9 64f 10 parts or 75+ percent  FIM - Upper Body Dressing/Undressing Upper body dressing/undressing steps patient completed: Thread/unthread right sleeve of pullover shirt/dresss;Thread/unthread left sleeve of pullover shirt/dress;Put head through opening of pull over shirt/dress Upper body dressing/undressing: 4: Min-Patient completed 75 plus % of tasks FIM - Lower Body Dressing/Undressing Lower body dressing/undressing steps patient completed: Thread/unthread right underwear leg;Thread/unthread left underwear leg;Pull underwear up/down;Thread/unthread right pants leg;Pull pants up/down Lower body dressing/undressing: 2:  Max-Patient completed 25-49% of tasks  FIM - Toileting Toileting steps completed by patient: Performs perineal hygiene;Adjust clothing after toileting Toileting: 3: Mod-Patient completed 2 of 3 steps  FIM - Diplomatic Services operational officer Devices: Bedside commode Toilet Transfers: 2-To toilet/BSC: Max A (lift and lower assist);2-From toilet/BSC: Max A (lift and lower assist)  FIM - Press photographer Assistive Devices: Arm rests Bed/Chair Transfer: 2: Supine > Sit: Max A (lifting assist/Pt. 25-49%);3: Sit > Supine: Mod A (lifting assist/Pt. 50-74%/lift 2 legs);2: Bed > Chair or W/C: Max A (lift and lower assist);2: Chair or W/C > Bed: Max A (lift and lower assist)  FIM - Locomotion: Wheelchair Distance: 150 Locomotion: Wheelchair: 1: Total Assistance/staff pushes wheelchair (Pt<25%) FIM - Locomotion: Ambulation Locomotion: Ambulation Assistive Devices: Walker - Hemi;Orthosis Ambulation/Gait Assistance: 1: +2 Total assist Locomotion: Ambulation: 0: Activity did not occur  Comprehension Comprehension Mode: Auditory Comprehension: 4-Understands basic 75 - 89% of the time/requires cueing 10 - 24% of the time  Expression Expression Mode: Verbal Expression: 2-Expresses basic 25 - 49% of the time/requires cueing 50 - 75% of the time. Uses single words/gestures.  Social Interaction Social Interaction: 4-Interacts appropriately 75 - 89% of the time - Needs redirection for appropriate language or to initiate interaction.  Problem Solving Problem Solving: 3-Solves basic 50 - 74% of the time/requires cueing 25 - 49% of the time  Memory Memory: 3-Recognizes or recalls 50 - 74% of the time/requires cueing 25 - 49% of the time  Medical Problem List and Plan:  1. 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 DVT Prophylaxis/Anticoagulation: Eliquis  3. Pain Management: Tylenol as needed  4. Neuropsych: This patient is not capable of  making decisions on his/her own behalf.   -ritalin for arousal and attention   5. Dysphasia. Dysphagia 2 nectar thick liquids. Monitor for any signs of aspiration. Followup speech therapy  -encourage adequate PO intake  -hs IVF--trial off of IVF check BMET in am 6. PAF/CAD/ CABG. Cardiac rate controlled. Continue Eliquis as directed. Followup cardiology services as needed  7. Hyperlipidemia. Crestor  8. Glaucoma. Continue eyedrops  9. Irritable bowel syndrome.Amitiza twice a day and MiraLAX daily.  10. UTI completed Abx  LOS (Days) 19 A FACE TO FACE EVALUATION WAS PERFORMED  Nicodemus Denk E 04/01/2013, 9:15 AM

## 2013-04-01 NOTE — Progress Notes (Signed)
Social Work Patient ID: Frank Weeks, male   DOB: [birth year], 79 y.o.   MRN: [id number] Met with pt, wife and son to inform team conference progression toward goals and plan for discharge.  Wife feels pt will need to go NH since She will not be able to handle him at his current level.  She and son to visit [facility] where they would like him to go from rehab. Will complete FL2 and begin working on placement.  Will also inform insurance

## 2013-04-01 NOTE — Progress Notes (Signed)
Physical Therapy Session Note  Patient Details  Name: Frank Weeks MRN: [id number] Date of Birth: 10-17-1928  Today's Date: 04/01/2013 Time: 1130-1200 Time Calculation (min): 30 min  Short Term Goals: Week 3:  PT Short Term Goal 1 (Week 3): Pt will perform bed mobility with bed rail and mod A PT Short Term Goal 2 (Week 3): Pt will perform bed <> w/c consistently to L and R mod A PT Short Term Goal 3 (Week 3): Pt will peform w/c mobility in controlled environment x 150' supervision PT Short Term Goal 4 (Week 3): Pt will perform gait in controlled environment with platform RW and orthosis x 50' and max A of one person  Therapy Documentation Precautions:  Precautions Precautions: Fall Precaution Comments: expressive aphasia Restrictions Weight Bearing Restrictions: No Pain: Pain Assessment Pain Assessment: No/denies pain Locomotion : Wheelchair Mobility Distance: 150 in controlled environment with LLE propulsion with min A to assist with steering and changes in direction and to minimize rocking with his trunk.  Other Treatments: Treatments Neuromuscular Facilitation: Right;Upper Extremity;Lower Extremity;Forced use;Activity to increase motor control;Activity to increase timing and sequencing;Activity to increase sustained activation;Activity to increase lateral weight shifting;Activity to increase anterior-posterior weight shifting during sustained squat position from w/c with max A to maintain R hand placement on mat and RUE extension and RLE positioning and stability during lateral and anterior weight shifting and attempting WB through RLE and RUE only during LLE forward and retro advancement.  Pt unable to fully weight shift to R to advance LLE.  Multiple sitting rest breaks required.    See FIM for current functional status  Therapy/Group: Individual Therapy  [name] [facility] 04/01/2013, 12:17 PM

## 2013-04-01 NOTE — Plan of Care (Signed)
Problem: RH BOWEL ELIMINATION Goal: RH STG MANAGE BOWEL WITH ASSISTANCE STG Manage Bowel with mod Assistance.  Outcome: Not Progressing Scheduled bowel medications increased today to maintain BM at least q2days.

## 2013-04-01 NOTE — Patient Care Conference (Signed)
Inpatient RehabilitationTeam Conference and Plan of Care Update Date: 04/01/2013   Time: 10:55 am    Patient Name: Frank Weeks      Medical Record Number: [id number]  Date of Birth: 1929/08/29 Sex: Male         Room/Bed: 4034/4034-01 Payor Info: Payor: [employer] [place] MEDICARE / Plan: BLUE MEDICARE / Product Type: *No Product type* /    Admitting Diagnosis: CVA  Admit Date/Time:  03/13/2013  4:09 PM Admission Comments: No comment available   Primary Diagnosis:  Embolic cerebral infarction Principal Problem: Embolic cerebral infarction  Patient Active Problem List   Diagnosis Date Noted  . Embolic cerebral infarction 03/16/2013  . Atrial flutter 03/11/2013  . Hemiparesis 03/08/2013  . Falls 06/17/2012  . Fatigue 06/12/2011  . Chest pain, non-cardiac   . Pacemaker-[employer]   . DIASTOLIC HEART FAILURE, ACUTE 84/13/2440  . Atrial fibrillation 11/04/2009  . DYSLIPIDEMIA 07/08/2009  . GLAUCOMA 07/08/2009  . HYPERTENSION, UNSPECIFIED 07/08/2009  . CAD, ARTERY BYPASS GRAFT 07/08/2009  . Subarachnoid hemorrhage 07/08/2009  . CEREBROVASCULAR DISEASE 07/08/2009  . PERIPHERAL VASCULAR DISEASE 07/08/2009  . PLEURAL EFFUSION 07/08/2009  . GASTROESOPHAGEAL REFLUX DISEASE 07/08/2009  . 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 SHORTNESS OF BREATH 03/03/2009    Expected Discharge Date: Expected Discharge Date: 04/07/13  Team Members Present: Physician leading conference: Dr. Claudette Laws Social Worker Present: Dossie Der, LCSW Nurse Present: Laural Roes, RN PT Present: [name], PT;Caroline Adriana Simas, PT;Other (comment) Clarisse Gouge Ripa-PT) OT Present: Other (comment);Leonette Monarch, OT;Kris Jacklynn Lewis, OT (kayla Perkinson-OT) SLP Present: Fae Pippin, SLP Other (Discipline and Name): Charolette Child     Current Status/Progress Goal Weekly Team Focus  Medical   No significant neurologic recovery, by mouth intake improved  Maintain nutrition off supplementation   Encourage fluids   Bowel/Bladder   incontinent of bladder  timed toileting   toilet patient q2-3 hours prn   Swallow/Nutrition/ Hydration   Dys.3 textures and thin liquids, no straws with full supervision   least restrictive p.o. intake  increase self-monitoring and correcting of anterior loss and pocketing   ADL's   min assist bathing, min assist UB dressing, max assist LB dressing, max assist squat pivot and stand pivot transfer  min assist overall except mod assist LB dressing, toileting, and toilet transfer  hemi-dressing technique, trunk control, weight shifting, decreasing assist with toileting and transfers   Mobility   Min A w/c mobility, mod-max A bed mobility and transfers, gait  Min A w/c level; gait with therapy only Mod A  Balance, decreasing assistance for transfers, gait   Communication   Mod assist  Min assist  increase breath support and pacing with multi-syllabic words   Safety/Cognition/ Behavioral Observations  Mod assist  Supervision-Min assist  continue to increase awareness and recall of information   Pain   denies pain  pain less than 2  monitor    Skin   rash to groin area  no skin breakdown  continue to monitor skin q shift      *See Care Plan and progress notes for long and short-term goals.  Barriers to Discharge: Heavy physical assistance in all therapies    Possible Resolutions to Barriers:  Continue compensation training    Discharge Planning/Teaching Needs:  pt making slow progress, wife here and pleased with how he is doing.  Will begin search for NH      Team Discussion:  Making slow gain-x-ray toe tender/painful tot he touch.  One person-ambulated with  tow people max assist  Revisions to Treatment Plan:  Upgraded diet to Dys 3 thin   Continued Need for Acute Rehabilitation Level of Care: The patient requires daily medical management by a physician with specialized training in physical medicine and rehabilitation for the following  conditions: Daily direction of a multidisciplinary physical rehabilitation program to ensure safe treatment while eliciting the highest outcome that is of practical value to the patient.: Yes Daily medical management of patient stability for increased activity during participation in an intensive rehabilitation regime.: Yes Daily analysis of laboratory values and/or radiology reports with any subsequent need for medication adjustment of medical intervention for : Neurological problems;Other  Christin Moline, Lemar Livings 04/03/2013, 1:11 PM

## 2013-04-01 NOTE — Progress Notes (Signed)
Occupational Therapy Session Note  Patient Details  Name: Frank Weeks MRN: [id number] Date of Birth: 1929/06/21  Today's Date: 04/01/2013 Time: 1000-1100 Time Calculation (min): 60 min  Short Term Goals: Week 3:  OT Short Term Goal 1 (Week 3): Pt will complete UB dressing with min assist OT Short Term Goal 2 (Week 3): Pt will complete LB bathing with min assist OT Short Term Goal 3 (Week 3): Pt will complete LB dressing with mod assist OT Short Term Goal 4 (Week 3): Pt will complete shower transfer with max assist OT Short Term Goal 5 (Week 3): Pt will complete toileting with mod assist of one caregiver  Skilled Therapeutic Interventions/Progress Updates:  Patient resting in bed upon arrival.  Self care retraining to include sponge bath at sink, dress and groom.  Focused session on bed mobility, sitting and standing balance, safe squat pivot transfer, communicating his wants/needs,  visual attention to right side of body and spatial awareness, and hemi bathing & dressing techniques.  Patient indicated that he wanted the curtains and blinds opened, after adult brief and pants on, patient indicated that he needed to urinate.  When given choice, he chose to stand and use the urinal so he was continent in his brief!  Patient resting in w/c with RUE support on w/c and all items within reach.  NT aware patient up in w/c awaiting his next therapy in ~25 min.  Therapy Documentation Precautions:  Precautions Precautions: Fall Precaution Comments: expressive aphasia Restrictions Weight Bearing Restrictions: No Pain: Reports occasional right great toe pain when touched during self care session. ADL: See FIM for current functional status  Therapy/Group: Individual Therapy  Timberly Yott 04/01/2013, 11:03 AM

## 2013-04-01 NOTE — Progress Notes (Signed)
Speech Language Pathology Daily Session Note  Patient Details  Name: Frank Weeks MRN: [id number] Date of Birth: [birth year]  Today's Date: 04/01/2013 Time: 1300-1400 Time Calculation (min): 60 min  Short Term Goals: Week 3: SLP Short Term Goal 1 (Week 3): Patient will perform oral motor and pharngeal strengthening exercises with Min assist multimodal cueing  SLP Short Term Goal 2 (Week 3): Patient will answer yes/no questions with 90% accuracy. SLP Short Term Goal 3 (Week 3): Patient will name basic ADL objects with Mod assist semantic and phonemic cues. SLP Short Term Goal 4 (Week 3): Patient will self-monitor and correct errors with Mod assist clinician cues. SLP Short Term Goal 6 (Week 3): Patient will consume thin liquids via cup with Min assist clinician cues to prevent overt s/s of aspiration.  Skilled Therapeutic Interventions: Skilled treatment session focused on addressing dysphagia and cognitive-linguistic goals.  SLP facilitated session with lunch of Dys.3 textures and thin liquids with Mod assist verbal cues to self-monitor and correct right-sided anterior loss and Min assist verbal cues to consume small single sips via cup.  Patient demonstrated throat clear x2 during meal; clinician cues for hard effortful throat clears appeared to reduce suspected penetrates.  SLP also facilitated session with naming single and multi-syllabic words with Min assist semantic and phonemic cues.  Of note, paraphasic errors were perseverative and semantic in nature.  Wife and son present for session and requested thing to work on outside of therapy; SLP demonstrated use of incentive spirometer to facilitate increased breath support for phrase level verbal expression.  Continue with current plan of care.   FIM:  Comprehension Comprehension Mode: Auditory Comprehension: 4-Understands basic 75 - 89% of the time/requires cueing 10 - 24% of the time Expression Expression Mode: Verbal Expression:  2-Expresses basic 25 - 49% of the time/requires cueing 50 - 75% of the time. Uses single words/gestures. Social Interaction Social Interaction: 4-Interacts appropriately 75 - 89% of the time - Needs redirection for appropriate language or to initiate interaction. Problem Solving Problem Solving: 3-Solves basic 50 - 74% of the time/requires cueing 25 - 49% of the time Memory Memory: 3-Recognizes or recalls 50 - 74% of the time/requires cueing 25 - 49% of the time FIM - Eating Eating Activity: 4: Helper checks for pocketed food  Pain Pain Assessment Pain Assessment: No/denies pain  Therapy/Group: Individual Therapy  Frank Weeks., CCC-SLP [phone]  Frank Weeks 04/01/2013, 4:17 PM

## 2013-04-01 NOTE — Progress Notes (Signed)
NUTRITION FOLLOW UP  DOCUMENTATION CODES  Per approved criteria   -Severe malnutrition in the context of acute illness or injury    Intervention:   1. Continue Ensure Pudding PO QID with medications. 2. RD to continue to follow nutrition care plan.  Nutrition Dx:   Inadequate oral intake related to poor appetite as evidenced by family report. Improving.  Goal:   Intake to meet >90% of estimated nutrition needs. Met.  Monitor:   weight trends, lab trends, I/O's, PO intake, supplement tolerance  Assessment:   PMHx of CAD/CABG, CVA 50 years ago. Admitted 5/25 s/p fall with acute infarct. Placed on Dysphagia 2 diet with Nectar Liquids by SLP.   Plan for d/c on 6/25, recommendations for SNF.  Per chart review and nurse tech, pt's oral intake has remained improved. Upgraded to Dysphagia 3 diet with thin liquids. Taking Ensure Pudding per chart review.  Pt meets criteria for severe MALNUTRITION in the context of acute illness as evidenced by 3% wt loss x 1 week and intake of <50% x at least 5 days.  Height: Ht Readings from Last 1 Encounters:  03/13/13 5\' 8"  (1.727 m)    Weight Status:   Wt Readings from Last 1 Encounters:  03/26/13 171 lb 8.3 oz (77.8 kg)  wt stable  Re-estimated needs:  Kcal: 1650 - 1800 Protein: 70 - 80 grams Fluid: 1.8 - 2 liters daily  Skin: intact  Diet Order: Dysphagia 3; thins   Intake/Output Summary (Last 24 hours) at 04/01/13 1104 Last data filed at 04/01/13 0914  Gross per 24 hour  Intake    600 ml  Output      1 ml  Net    599 ml    Last BM: 6/16   Labs:   Recent Labs Lab 03/30/13 0520  NA 137  K 4.1  CL 104  CO2 27  BUN 12  CREATININE 0.76  CALCIUM 9.3  GLUCOSE 109*    CBG (last 3)  No results found for this basename: GLUCAP,  in the last 72 hours  Scheduled Meds: . antiseptic oral rinse  15 mL Mouth Rinse q12n4p  . apixaban  5 mg Oral BID  . feeding supplement  1 Container Oral QID  . lubiprostone  24 mcg Oral  BID WC  . methylphenidate  10 mg Oral BID WC  . multivitamin with minerals  1 tablet Oral Daily  . nystatin  5 mL Oral QID  . rosuvastatin  10 mg Oral q1800  . senna-docusate  2 tablet Oral BID  . Travoprost (BAK Free)  1 drop Both Eyes QHS    Continuous Infusions: none    Jarold Motto MS, RD, LDN Pager: [phone] After-hours pager: [phone]

## 2013-04-01 NOTE — Progress Notes (Signed)
Occupational Therapy Session Note  Patient Details  Name: Frank Weeks MRN: [id number] Date of Birth: Feb 25, 1929  Today's Date: 04/01/2013 Time: 4034-7425 Time Calculation (min): 47 min  Short Term Goals: Week 3:  OT Short Term Goal 1 (Week 3): Pt will complete UB dressing with min assist OT Short Term Goal 2 (Week 3): Pt will complete LB bathing with min assist OT Short Term Goal 3 (Week 3): Pt will complete LB dressing with mod assist OT Short Term Goal 4 (Week 3): Pt will complete shower transfer with max assist OT Short Term Goal 5 (Week 3): Pt will complete toileting with mod assist of one caregiver  Skilled Therapeutic Interventions/Progress Updates:    Pt seen for 1:1 OT with focus on NM Re-ed, transfers, sit <> stand, scooting, and LUE WB to facilitate adduction.  Pt finishing with SLP upon arrival.  Pt's wife and son present and observed session with appropriate questions regarding progress.  Squat pivot to Rt to mat with max assist and manual facilitation for pivot.  Engaged in education regarding forward scoots and lateral scoots to assist in transfers.  Pt requires max cues for weight shifting to engage in scooting. Weight bearing in unsupported sitting with Rt shoulder in external rotation followed by noted slight active movement in adduction when pt's weight biased towards Rt side. Min assist provided for sitting balance when reaching outside BOS. Engaged in sit <> stand with RUE around this therapist's shoulders to promote upright standing posture and utilized pt's son to promote weight shifting to Lt with reaching outside BOS.  Pt's sit to stand progressing to min-mod assist at edge of mat with tactile cues for forward weight shifting.  Able to elicit Rt knee extension in standing with reaching and weight shifting to Lt.  Pt returned to bed at end of session, family at bedside.  Therapy Documentation Precautions:  Precautions Precautions: Fall Precaution Comments: expressive  aphasia Restrictions Weight Bearing Restrictions: No Pain: Pain Assessment Pain Assessment: No/denies pain  See FIM for current functional status  Therapy/Group: Individual Therapy  Leonette Monarch 04/01/2013, 3:08 PM

## 2013-04-02 ENCOUNTER — Inpatient Hospital Stay (HOSPITAL_COMMUNITY): Payer: Medicare Other | Admitting: *Deleted

## 2013-04-02 ENCOUNTER — Inpatient Hospital Stay (HOSPITAL_COMMUNITY): Payer: Medicare Other

## 2013-04-02 ENCOUNTER — Inpatient Hospital Stay (HOSPITAL_COMMUNITY): Payer: Medicare Other | Admitting: Speech Pathology

## 2013-04-02 ENCOUNTER — Inpatient Hospital Stay (HOSPITAL_COMMUNITY): Payer: Medicare Other | Admitting: Occupational Therapy

## 2013-04-02 DIAGNOSIS — I4891 Unspecified atrial fibrillation: Secondary | ICD-10-CM

## 2013-04-02 DIAGNOSIS — G8110 Spastic hemiplegia affecting unspecified side: Secondary | ICD-10-CM

## 2013-04-02 DIAGNOSIS — I6340 Cerebral infarction due to embolism of unspecified cerebral artery: Secondary | ICD-10-CM

## 2013-04-02 DIAGNOSIS — I69991 Dysphagia following unspecified cerebrovascular disease: Secondary | ICD-10-CM

## 2013-04-02 LAB — BASIC METABOLIC PANEL
Calcium: 8.9 mg/dL (ref 8.4–10.5)
GFR calc Af Amer: >90 mL/min (ref >90)
GFR calc non Af Amer: 81 mL/min — ABNORMAL LOW (ref >90)
Glucose, Bld: 99 mg/dL (ref 70–99)
Potassium: 4.1 mEq/L (ref 3.5–5.1)
Sodium: 135 mEq/L (ref 135–145)

## 2013-04-02 MED ORDER — DICLOFENAC SODIUM 1 % TD GEL
2.0000 g | Freq: Four times a day (QID) | TRANSDERMAL | Status: DC
  Administered 2013-04-02 – 2013-04-06 (×13): 2 g via TOPICAL
  Filled 2013-04-02: qty 100

## 2013-04-02 NOTE — Progress Notes (Signed)
Speech Language Pathology Daily Session Note & Weekly Progress Note  Patient Details  Name: Frank Weeks MRN: [id number] Date of Birth: Feb 16, 1929  Today's Date: 04/02/2013 Time: 1030-1130 Time Calculation (min): 60 min  Short Term Goals: Week 3: SLP Short Term Goal 1 (Week 3): Patient will perform oral motor and pharngeal strengthening exercises with Min assist multimodal cueing  SLP Short Term Goal 2 (Week 3): Patient will answer yes/no questions with 90% accuracy. SLP Short Term Goal 3 (Week 3): Patient will name basic ADL objects with Mod assist semantic and phonemic cues. SLP Short Term Goal 4 (Week 3): Patient will self-monitor and correct errors with Mod assist clinician cues. SLP Short Term Goal 6 (Week 3): Patient will consume thin liquids via cup with Min assist clinician cues to prevent overt s/s of aspiration.  Skilled Therapeutic Interventions: Skilled treatment session focused on addressing cognitive-linguistic goals.  SLP facilitated session with naming single and multi-syllabic words with Min assist semantic and phonemic cues.  Of note, paraphasic errors were perseverative and semantic in nature.  SLP also targeted voicing; patient utilizes normal vocal pitch while singing and SLP provided Max cues to fade singing phrases to stating phrases without utilizing glottis fry.  Continue with current plan of care.   FIM:  Comprehension Comprehension Mode: Auditory Comprehension: 4-Understands basic 75 - 89% of the time/requires cueing 10 - 24% of the time Expression Expression Mode: Verbal Expression: 2-Expresses basic 25 - 49% of the time/requires cueing 50 - 75% of the time. Uses single words/gestures. Social Interaction Social Interaction: 4-Interacts appropriately 75 - 89% of the time - Needs redirection for appropriate language or to initiate interaction. Problem Solving Problem Solving: 3-Solves basic 50 - 74% of the time/requires cueing 25 - 49% of the time Memory Memory:  3-Recognizes or recalls 50 - 74% of the time/requires cueing 25 - 49% of the time FIM - Eating Eating Activity: 4: Helper checks for pocketed food  Pain Pain Assessment Pain Assessment: No/denies pain  Therapy/Group: Individual Therapy  Speech Language Pathology Weekly Progress Note  Patient Details  Name: Frank Weeks MRN: [id number] Date of Birth: [birth year]  Today's Date: 04/02/2013  Short Term Goals: Week 3: SLP Short Term Goal 1 (Week 3): Patient will perform oral motor and pharngeal strengthening exercises with Min assist multimodal cueing  SLP Short Term Goal 1 - Progress (Week 3): Progressing toward goal SLP Short Term Goal 2 (Week 3): Patient will answer yes/no questions with 90% accuracy. SLP Short Term Goal 2 - Progress (Week 3): Met SLP Short Term Goal 3 (Week 3): Patient will name basic ADL objects with Mod assist semantic and phonemic cues. SLP Short Term Goal 3 - Progress (Week 3): Met SLP Short Term Goal 4 (Week 3): Patient will self-monitor and correct errors with Mod assist clinician cues. SLP Short Term Goal 4 - Progress (Week 3): Progressing toward goal SLP Short Term Goal 6 (Week 3): Patient will consume thin liquids via cup with Min assist clinician cues to prevent overt s/s of aspiration. SLP Short Term Goal 6 - Progress (Week 3): Met Week 4: SLP Short Term Goal 1 (Week 4): Patient will perform oral motor and pharngeal strengthening exercises with Min assist multimodal cueing. SLP Short Term Goal 2 (Week 4): Patient will name basic ADL objects with Min assist semantic and phonemic cues. SLP Short Term Goal 3 (Week 4): Patient will self-monitor and correct vocal tone/quality with Mod assist clinician cues. SLP Short Term Goal 4 (Week 4):  Patient will utilize speech intelligibility strategies at the phrase level with Min assist verbal cues. SLP Short Term Goal 5 (Week 4): Patient will demonstrate effecient mastication of regualr textures with Min assist clinician  cues.  Weekly Progress Updates: Patient met 3 out of 5 short term objectives this reporting period due to gains in toleration of thin liquids, object naming accuracy and accuracy of yes/no responses.  Patient's apraxia appears to impede performance during therapy sessions less this week.  Aphasia and voicing are continuing to limit his overall ability to verbally express wants and needs intelligibly.  Patient's endurance and participation continues to increase and his liquids have been advanced to thin consistency.  Patient continues to require Mod assist level of cuing and as a result, he continues to require skilled SLP services to address dysphagia, speech-language and cognitive deficits to maximize functional independence and reduce burden of care prior to discharge.    SLP Intensity: Minumum of 1-2 x/day, 30 to 90 minutes SLP Frequency: 5 out of 7 days SLP Duration/Estimated Length of Stay: 1 week SLP Treatment/Interventions: Cognitive remediation/compensation;Dysphagia/aspiration precaution training;Patient/family education;Oral motor exercises;Therapeutic Exercise;Therapeutic Activities;Speech/Language facilitation;Functional tasks  Charlane Ferretti., CCC-SLP [phone]  Zyia Kaneko 04/02/2013, 1:34 PM

## 2013-04-02 NOTE — Progress Notes (Signed)
Orthopedic Tech Progress Note Patient Details:  Frank Weeks 1929/09/08 [id number]  Ortho Devices Type of Ortho Device: Postop shoe/boot Ortho Device/Splint Interventions: Application   Shawnie Pons 04/02/2013, 9:10 AM

## 2013-04-02 NOTE — Progress Notes (Signed)
Physical Therapy Session Note  Patient Details  Name: Frank Weeks MRN: [id number] Date of Birth: 10/09/1929  Today's Date: 04/02/2013 Time: 2130-8657 Time Calculation (min): 40 min  Short Term Goals: Week 3:  PT Short Term Goal 1 (Week 3): Pt will perform bed mobility with bed rail and mod A PT Short Term Goal 2 (Week 3): Pt will perform bed <> w/c consistently to L and R mod A PT Short Term Goal 3 (Week 3): Pt will peform w/c mobility in controlled environment x 150' supervision PT Short Term Goal 4 (Week 3): Pt will perform gait in controlled environment with platform RW and orthosis x 50' and max A of one person  Skilled Therapeutic Interventions/Progress Updates:    Patient received sitting in wheelchair. Session focused on wheelchair mobility in controlled and home environments, on various surfaces, and with obstacle negotiation. Patient performed wheelchair mobility 200' x1 in controlled environment on tile surface with min assist for steering; 20' x1 in home environment on carpet in ADL apartment (requiring negotiation of obstacles in confined spaces) with min-modA for steering in small spaces and objects to negotiate. Patient performed wheelchair mobility on carpet in day room through obstacle course consisting of 4 cones to weave in/out of and 2 bolsters to maneuver around. Patient requires min assist for steering and mod verbal and visual cues to locate cones and properly negotiate around them.  Patient returned to room and left seated in wheelchair with seatbelt donned and all needs within reach.  Therapy Documentation Precautions:  Precautions Precautions: Fall Precaution Comments: expressive aphasia Restrictions Weight Bearing Restrictions: No Pain: Pain Assessment Pain Assessment: No/denies pain Pain Score: 0-No pain Locomotion : Ambulation Ambulation/Gait Assistance: Not tested (comment) Wheelchair Mobility Wheelchair Mobility: Yes Wheelchair Assistance: 4: Min  assist;3: Mod Chiropodist Details: Tactile cues for initiation;Visual cues/gestures for precautions/safety;Visual cues/gestures for sequencing;Tactile cues for placement;Verbal cues for sequencing;Verbal cues for technique Wheelchair Propulsion: Left upper extremity;Left lower extremity Distance: 200   See FIM for current functional status  Therapy/Group: Individual Therapy  Chipper Herb. Tequan Redmon, PT, DPT  04/02/2013, 4:22 PM

## 2013-04-02 NOTE — Progress Notes (Signed)
Occupational Therapy Session Note  Patient Details  Name: Frank Weeks MRN: [id number] Date of Birth: 08-17-1929  Today's Date: 04/02/2013 Time: 1308-6578 Time Calculation (min): 61 min  Short Term Goals: Week 3:  OT Short Term Goal 1 (Week 3): Pt will complete UB dressing with min assist OT Short Term Goal 2 (Week 3): Pt will complete LB bathing with min assist OT Short Term Goal 3 (Week 3): Pt will complete LB dressing with mod assist OT Short Term Goal 4 (Week 3): Pt will complete shower transfer with max assist OT Short Term Goal 5 (Week 3): Pt will complete toileting with mod assist of one caregiver  Skilled Therapeutic Interventions/Progress Updates:    Pt worked on bathing and dressing sit to stand at the sink.  Overall needs max assist for stand pivot transfer to wheelchair.  Mod demonstrational cueing to sequence both bathing and dressing following hemi techniques.  Pt needing mod assist for sit to stand when attempting to wash peri area and when pulling pants over hips.  Encouraged pt to work on sitting unsupported while performing bathing tasks.  Occasional LOB posteriorly with dynamic activities as well as forward and to the right when attempting to reach down to his feet.  Pt needing mod assist to regain balance.    Therapy Documentation Precautions:  Precautions Precautions: Fall Precaution Comments: expressive aphasia Restrictions Weight Bearing Restrictions: No  ADL: See FIM for current functional status  Therapy/Group: Individual Therapy  Ziyon Soltau OTR/L 04/02/2013, 10:42 AM

## 2013-04-02 NOTE — Progress Notes (Signed)
Orthopedic Tech Progress Note Patient Details:  Frank Weeks 10/08/1929 [id number]  Ortho Devices Type of Ortho Device: Postop shoe/boot Ortho Device/Splint Interventions: Application   Shawnie Pons 04/02/2013, 9:11 AM

## 2013-04-02 NOTE — Progress Notes (Signed)
Patient ID: Frank Weeks, male   DOB: 1929-01-31, 79 y.o.   MRN: [id number] Subjective/Complaints: 79 y.o. right-handed male with history of CAD/CABG, PAF pacemaker, subarachnoid hemorrhage 50 years ago CVA. Admitted 03/08/2013 with fall reported by wife with right-sided weakness and slurred speech. Cranial CT showed developing low density left basal ganglia external capsule region consistent with acute infarct. Carotid Dopplers with no ICA stenosis. Echocardiogram with ejection fraction of 60% and normal systolic function. CT angiogram of the head showed a filling defect throughout the left M1 segment compatible with embolus causing acute infarct in left basal ganglia.  Awakens to voice, aphasic Toe pain Left. Appetite improved Review of Systems  Unable to perform ROS: medical condition   Objective: Vital Signs: Blood pressure 113/73, pulse 70, temperature 97.5 F (36.4 C), temperature source Oral, resp. rate 17, height 5\' 8"  (1.727 m), weight 77.8 kg (171 lb 8.3 oz), SpO2 97.00%. Dg Toe Great Left  04/01/2013   *RADIOLOGY REPORT*  Clinical Data: First toe pain  LEFT GREAT TOE  Comparison: None.  Findings: No acute fracture or dislocation is noted.  Degenerate changes are noted at the metatarsophalangeal joint.  No soft tissue abnormality is seen.  IMPRESSION: No acute abnormality noted.   Original Report Authenticated By: Alcide Clever, M.D.   Results for orders placed during the hospital encounter of 03/13/13 (from the past 72 hour(s))  CBC     Status: None   Collection Time    04/01/13  6:20 AM      Result Value Range   WBC 7.6  4.0 - 10.5 K/uL   RBC 4.23  4.22 - 5.81 MIL/uL   Hemoglobin 13.5  13.0 - 17.0 g/dL   HCT 62.9  52.8 - 41.3 %   MCV 94.6  78.0 - 100.0 fL   MCH 31.9  26.0 - 34.0 pg   MCHC 33.8  30.0 - 36.0 g/dL   RDW 24.4  01.0 - 27.2 %   Platelets 198  150 - 400 K/uL    Labs reviewed normal    Assessment/Plan: 1. Functional deficits secondary to Left MCA distribution  infarct which require 3+ hours per day of interdisciplinary therapy in a comprehensive inpatient rehab setting. Physiatrist is providing close team supervision and 24 hour management of active medical problems listed below. Physiatrist and rehab team continue to assess barriers to discharge/monitor patient progress toward functional and medical goals.  FIM: FIM - Bathing Bathing Steps Patient Completed: Chest;Right Arm;Abdomen;Front perineal area;Right upper leg;Left upper leg;Buttocks;Right lower leg (including foot);Left lower leg (including foot) Bathing: 4: Min-Patient completes 8-9 23f 10 parts or 75+ percent  FIM - Upper Body Dressing/Undressing Upper body dressing/undressing steps patient completed: Thread/unthread left sleeve of pullover shirt/dress;Put head through opening of pull over shirt/dress;Pull shirt over trunk Upper body dressing/undressing: 4: Min-Patient completed 75 plus % of tasks FIM - Lower Body Dressing/Undressing Lower body dressing/undressing steps patient completed: Thread/unthread right pants leg;Pull pants up/down;Thread/unthread left pants leg Lower body dressing/undressing: 1: Total-Patient completed less than 25% of tasks  FIM - Toileting Toileting steps completed by patient: Performs perineal hygiene;Adjust clothing after toileting Toileting: 3: Mod-Patient completed 2 of 3 steps  FIM - Diplomatic Services operational officer Devices: Psychiatrist Transfers: 1-Two helpers  FIM - Banker Devices: Arm rests Bed/Chair Transfer: 2: Chair or W/C > Bed: Max A (lift and lower assist);2: Bed > Chair or W/C: Max A (lift and lower assist)  FIM - Locomotion: Wheelchair Distance: 150  Locomotion: Wheelchair: 4: Travels 150 ft or more: maneuvers on rugs and over door sillls with minimal assistance (Pt.>75%) FIM - Locomotion: Ambulation Locomotion: Ambulation Assistive Devices: Walker -  Hemi;Orthosis Ambulation/Gait Assistance: 1: +2 Total assist Locomotion: Ambulation: 0: Activity did not occur  Comprehension Comprehension Mode: Auditory Comprehension: 4-Understands basic 75 - 89% of the time/requires cueing 10 - 24% of the time  Expression Expression Mode: Verbal Expression: 3-Expresses basic 50 - 74% of the time/requires cueing 25 - 50% of the time. Needs to repeat parts of sentences.  Social Interaction Social Interaction: 4-Interacts appropriately 75 - 89% of the time - Needs redirection for appropriate language or to initiate interaction.  Problem Solving Problem Solving: 3-Solves basic 50 - 74% of the time/requires cueing 25 - 49% of the time  Memory Memory: 3-Recognizes or recalls 50 - 74% of the time/requires cueing 25 - 49% of the time  Medical Problem List and Plan:  1. 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 DVT Prophylaxis/Anticoagulation: Eliquis  3. Pain Management: Tylenol as needed  4. Neuropsych: This patient is not capable of making decisions on his/her own behalf.   -ritalin for arousal and attention   5. Dysphasia. Dysphagia 2 nectar thick liquids. Monitor for any signs of aspiration. Followup speech therapy  -encourage adequate PO intake   6. PAF/CAD/ CABG. Cardiac rate controlled. Continue Eliquis as directed. Followup cardiology services as needed  7. Hyperlipidemia. Crestor  8. Glaucoma. Continue eyedrops  9. Irritable bowel syndrome.Amitiza twice a day and MiraLAX daily.  10. UTI completed Abx 11.  Left 1 st MTP OA, cast shoe and diclofenac gel LOS (Days) 20 A FACE TO FACE EVALUATION WAS PERFORMED  Rahkeem Senft E 04/02/2013, 8:29 AM

## 2013-04-02 NOTE — Progress Notes (Signed)
Occupational Therapy Session Note  Patient Details  Name: Frank Weeks MRN: [id number] Date of Birth: Dec 09, 1928  Today's Date: 04/02/2013 Time: 1400-1430 Time Calculation (min): 30 min  Short Term Goals: Week 3:  OT Short Term Goal 1 (Week 3): Pt will complete UB dressing with min assist OT Short Term Goal 2 (Week 3): Pt will complete LB bathing with min assist OT Short Term Goal 3 (Week 3): Pt will complete LB dressing with mod assist OT Short Term Goal 4 (Week 3): Pt will complete shower transfer with max assist OT Short Term Goal 5 (Week 3): Pt will complete toileting with mod assist of one caregiver  Skilled Therapeutic Interventions/Progress Updates:    Therapy session focused functional transfer and NMR to RUE. Pt received supine in bed and was asleep. Pt's wife reported he had been asleep for awhile. Pt difficult to arouse with touch so raised HOB to increase alertness then provided cool wash cloth as pt wiped face. Completed squat pivot transfers bed>w/c and w/c<>mat with max assist and verbal cues for hand placement. Engaged in scooting on mat with mod assist when scooting to R. Attempted weightbearing through RUE with shoulder in external rotation however pt reported pain in digits when extended. Educated and practiced PROM and self-ROM to digits and pt able to demonstrate correctly. Engaged in postural control activity of having pt reach across body to R side to retrieve ring them place out of bos in front of him. Pt with decreased postural control requiring mod-max assist to correct.   Therapy Documentation Precautions:  Precautions Precautions: Fall Precaution Comments: expressive aphasia Restrictions Weight Bearing Restrictions: No General:   Vital Signs: Therapy Vitals Temp: 97.6 F (36.4 C) Temp src: Oral Pulse Rate: 70 Resp: 18 BP: 121/76 mmHg Patient Position, if appropriate: Sitting Oxygen Therapy SpO2: 98 % O2 Device: None (Room air) Pain: Pt with c/o pain  in right digits during full extension. No swelling noted and provided PROM to stretch. Educated pt on provided self ROM to digits and demonstrated ability to do this.   See FIM for current functional status  Therapy/Group: Individual Therapy  Daneil Dan 04/02/2013, 3:18 PM

## 2013-04-03 ENCOUNTER — Inpatient Hospital Stay (HOSPITAL_COMMUNITY): Payer: Medicare Other | Admitting: Speech Pathology

## 2013-04-03 ENCOUNTER — Inpatient Hospital Stay (HOSPITAL_COMMUNITY): Payer: Medicare Other | Admitting: Physical Therapy

## 2013-04-03 ENCOUNTER — Inpatient Hospital Stay (HOSPITAL_COMMUNITY): Payer: Medicare Other | Admitting: Occupational Therapy

## 2013-04-03 LAB — CBC
Hemoglobin: 14 g/dL (ref 13.0–17.0)
Platelets: 200 10*3/uL (ref 150–400)
RBC: 4.34 MIL/uL (ref 4.22–5.81)
WBC: 7.6 10*3/uL (ref 4.0–10.5)

## 2013-04-03 NOTE — Progress Notes (Signed)
Social Work Patient ID: Frank Weeks, male   DOB: 1929-02-12, 79 y.o.   MRN: [id number] Spoke with kelly-Admission Coordinator for [facility] who reports a bed offer for Monday for pt.  Have left a message for wife and informed pt. Contacted [employer] who has given approval for [facility] Monday.  Will transfer via amb, Monday am. Darl Pikes has authorized him until Monday on CIR. [facility] is pt and family's first choice.  Will make team aware and work on transfer for Monday.

## 2013-04-03 NOTE — Progress Notes (Signed)
Physical Therapy Weekly Progress Note  Patient Details  Name: Frank Weeks MRN: [id number] Date of Birth: 20-Jul-1929  Today's Date: 04/03/2013 Time: 4098-1191  Time Calculation (min): 56 min   PM session: Pt missed 30 minutes pm PT session secondary to refusal.  Pt in bed; pt just found out that he is being transferred to SNF on Monday after therapy.  Pt very frustrated and unhappy about transfer and verbalized why go when he isn't getting better.  Attempted to provide emotional support to pt and wife and encourage pt to perform transfer to w/c for dinner or toilet transfer but pt verbalized "just let me be!".  Will f/u with pt on Monday.    Patient has made steady progress and has met 1 of 4 short term goals.  Pt continues to require mod-max A for bed mobility on flat bed, bed <> w/c transfers stand pivot, min A w/c mobility with hemi technique in controlled environment and max A for gait with hemi walker and RLE orthosis.  Secondary to pt continued need for significant physical assistance pt goals further downgraded to mod A overall.   Patient continues to demonstrate the following deficits: R hemiplegia with impaired motor control, timing, sequencing, apraxia, impaired postural control, balance, gait and therefore will continue to benefit from skilled PT intervention to enhance overall performance with activity tolerance, balance, postural control, ability to compensate for deficits, functional use of  right upper extremity and right lower extremity, attention and coordination.  Patient not progressing toward long term goals.  See goal revision..  Plan of care revisions: goals downgraded to mod A overall w/c level with likely D/C to SNF next week for more prolonged rehabilitation.  PT Short Term Goals Week 3:  PT Short Term Goal 1 (Week 3): Pt will perform bed mobility with bed rail and mod A PT Short Term Goal 1 - Progress (Week 3): Partly met PT Short Term Goal 2 (Week 3): Pt will perform bed  <> w/c consistently to L and R mod A PT Short Term Goal 2 - Progress (Week 3): Partly met PT Short Term Goal 3 (Week 3): Pt will peform w/c mobility in controlled environment x 150' supervision PT Short Term Goal 3 - Progress (Week 3): Partly met PT Short Term Goal 4 (Week 3): Pt will perform gait in controlled environment with platform RW and orthosis x 50' and max A of one person PT Short Term Goal 4 - Progress (Week 3): Met Week 4:   = LTG  Skilled Therapeutic Interventions/Progress Updates:   Pt in w/c; reporting that he had incontinent urine episode.  From w/c performed sit <> stand x 2 reps with mod-max A overall and pt performed static standing to assist with clothing doffing, brief change and clothing donning with mod-max A overall.  In gym performed gait training with L hemi walker and R orthosis x 25' with max-total A with therapist under RUE to provide trunk support for upright posture, verbal and tactile cues for L lateral weight shifting, assistance to fully clear and advance RLE, placement and assistance for full R anterior weight shift over RLE and RLE hip and knee extension in stance for full LLE step length.  Fatigued quickly.  NT to assist pt with lunch and then return pt to bed to rest before pm session.    Therapy Documentation Precautions:  Precautions Precautions: Fall Precaution Comments: expressive aphasia Restrictions Weight Bearing Restrictions: No Pain: Pain Assessment Pain Assessment: No/denies pain Locomotion :  Ambulation Ambulation/Gait Assistance: 2: Max assist   See FIM for current functional status  Therapy/Group: Individual Therapy  [name] [facility] 04/03/2013, 12:47 PM

## 2013-04-03 NOTE — Progress Notes (Signed)
Social Work Patient ID: Frank Weeks, male   DOB: 1929-07-08, 79 y.o.   MRN: [id number] Met with wife and daughter who were in his room to inform of bed offer for Monday.  All in agreement and have apprecitated the great care here. Gather paperwork for Monday.

## 2013-04-03 NOTE — Progress Notes (Signed)
Occupational Therapy Weekly Progress Note  Patient Details  Name: Frank Weeks MRN: [id number] Date of Birth: 07/18/1929  Today's Date: 04/03/2013 Time: 4098-1191 Time Calculation (min): 60 min  Patient has met 2 of 4 short term goals.  Pt continues to make steady progress towards goals.  He continues to require mod-max assist with bed mobility and transfer to/from bed, w/c, mat, and BSC.  Pt has shown tremendous progress with sit <> stand during self-care tasks, requiring mod assist of one caregiver to maintain standing while he pulls up pants.  Pt responding well to education of hemi-technique with LB dressing, progressing quickly from a +2 to a mod-max assist of one caregiver.  Pt likely to D/C to SNF next week to allow for more prolonged rehabilitation.  Patient continues to demonstrate the following deficits: Rt hemiplegia with impaired motor control, timing, sequencing, apraxia, impaired postural control, balance, activity tolerance, functional use of right upper extremity and right lower extremity and therefore will continue to benefit from skilled OT intervention to enhance overall performance with BADL and Reduce care partner burden.  Patient progressing toward long term goals..  Continue plan of care.  OT Short Term Goals Week 3:  OT Short Term Goal 1 (Week 3): Pt will complete UB dressing with min assist OT Short Term Goal 1 - Progress (Week 3): Met OT Short Term Goal 2 (Week 3): Pt will complete LB bathing with min assist OT Short Term Goal 2 - Progress (Week 3): Progressing toward goal OT Short Term Goal 3 (Week 3): Pt will complete LB dressing with mod assist OT Short Term Goal 3 - Progress (Week 3): Progressing toward goal OT Short Term Goal 4 (Week 3): Pt will complete shower transfer with max assist OT Short Term Goal 4 - Progress (Week 3): Discontinued (comment) OT Short Term Goal 5 (Week 3): Pt will complete toileting with mod assist of one caregiver OT Short Term Goal 5 -  Progress (Week 3): Met Week 4:  OT Short Term Goal 1 (Week 4): STG = LTGs with focus on transfers and caregiver of one  Skilled Therapeutic Interventions/Progress Updates:    Pt seen for ADL retraining with focus on bed mobility, transfers, sit <> stand, and increased participation in self-care tasks with carryover of hemi-dressing technique.  Pt in bed upon arrival reporting he was incontinent of urine.  Engaged in rolling Rt and Lt with physical assistance for positioning of RLE to assist in rolling for perineal hygiene.  Squat pivot transfer to w/c with mod-max assist with verbal and tactile cues for technique and positioning.  During bathing, pt needed to have BM.  Stand pivot transfer to drop arm BSC with UE assist on sink and max assist.  Pt with no results on toilet, however completed hygiene and pulling up brief with mod assist to maintain standing balance.  Pt completed LB dressing with hemi-technique this session with steady assist to maintain RLE across Lt knee while threading pant leg.  Pt continues to require mod assist to maintain upright standing balance when pulling up pants.  Therapy Documentation Precautions:  Precautions Precautions: Fall Precaution Comments: expressive aphasia Restrictions Weight Bearing Restrictions: No Pain:  Pt with no c/o pain this session. ADL: ADL Grooming: Moderate assistance Where Assessed-Grooming: Sitting at sink Upper Body Bathing: Minimal assistance Where Assessed-Upper Body Bathing: Sitting at sink Lower Body Bathing: Moderate assistance Where Assessed-Lower Body Bathing: Sitting at sink;Standing at sink Upper Body Dressing: Minimal assistance Where Assessed-Upper Body Dressing: Sitting at  sink Lower Body Dressing: Maximal assistance Where Assessed-Lower Body Dressing: Sitting at sink;Standing at sink Toileting: Moderate assistance Where Assessed-Toileting: Bedside Commode Toilet Transfer: Maximal assistance Toilet Transfer Method: Stand  pivot;Squat pivot Toilet Transfer Equipment: Extra wide drop arm bedside commode ADL Comments: Pt demosntrating increased initiation with transfers and weight shifting to assist in LB dressing and toileting tasks.  Hemi-technique initiated with LB dressing.  See FIM for current functional status  Therapy/Group: Individual Therapy  Leonette Monarch 04/03/2013, 12:22 PM

## 2013-04-03 NOTE — Progress Notes (Signed)
Patient ID: Frank Weeks, male   DOB: [birth year], 79 y.o.   MRN: [id number] Subjective/Complaints: 79 y.o. right-handed male with history of CAD/CABG, PAF pacemaker, subarachnoid hemorrhage 50 years ago CVA. Admitted 03/08/2013 with fall reported by wife with right-sided weakness and slurred speech. Cranial CT showed developing low density left basal ganglia external capsule region consistent with acute infarct. Carotid Dopplers with no ICA stenosis. Echocardiogram with ejection fraction of 60% and normal systolic function. CT angiogram of the head showed a filling defect throughout the left M1 segment compatible with embolus causing acute infarct in left basal ganglia.  Awakens to voice, aphasic Toe pain Left. Appetite improved Review of Systems  Unable to perform ROS: medical condition   Objective: Vital Signs: Blood pressure 100/60, pulse 76, temperature 97.9 F (36.6 C), temperature source Oral, resp. rate 17, height 5\' 8"  (1.727 m), weight 77.8 kg (171 lb 8.3 oz), SpO2 97.00%. Dg Toe Great Left  04/01/2013   *RADIOLOGY REPORT*  Clinical Data: First toe pain  LEFT GREAT TOE  Comparison: None.  Findings: No acute fracture or dislocation is noted.  Degenerate changes are noted at the metatarsophalangeal joint.  No soft tissue abnormality is seen.  IMPRESSION: No acute abnormality noted.   Original Report Authenticated By: Alcide Clever, M.D.   Results for orders placed during the hospital encounter of 03/13/13 (from the past 72 hour(s))  CBC     Status: None   Collection Time    04/01/13  6:20 AM      Result Value Range   WBC 7.6  4.0 - 10.5 K/uL   RBC 4.23  4.22 - 5.81 MIL/uL   Hemoglobin 13.5  13.0 - 17.0 g/dL   HCT 40.9  81.1 - 91.4 %   MCV 94.6  78.0 - 100.0 fL   MCH 31.9  26.0 - 34.0 pg   MCHC 33.8  30.0 - 36.0 g/dL   RDW 78.2  95.6 - 21.3 %   Platelets 198  150 - 400 K/uL  BASIC METABOLIC PANEL     Status: Abnormal   Collection Time    04/02/13  7:53 AM      Result Value Range   Sodium 135  135 - 145 mEq/L   Potassium 4.1  3.5 - 5.1 mEq/L   Chloride 102  96 - 112 mEq/L   CO2 25  19 - 32 mEq/L   Glucose, Bld 99  70 - 99 mg/dL   BUN 11  6 - 23 mg/dL   Creatinine, Ser 0.86  0.50 - 1.35 mg/dL   Calcium 8.9  8.4 - 57.8 mg/dL   GFR calc non Af Amer 81 (*) >90 mL/min   GFR calc Af Amer >90  >90 mL/min   Comment:            The eGFR has been calculated     using the CKD EPI equation.     This calculation has not been     validated in all clinical     situations.     eGFR's persistently     <90 mL/min signify     possible Chronic Kidney Disease.  CBC     Status: None   Collection Time    04/03/13  4:40 AM      Result Value Range   WBC 7.6  4.0 - 10.5 K/uL   RBC 4.34  4.22 - 5.81 MIL/uL   Hemoglobin 14.0  13.0 - 17.0 g/dL   HCT 46.9  62.9 -  52.0 %   MCV 93.8  78.0 - 100.0 fL   MCH 32.3  26.0 - 34.0 pg   MCHC 34.4  30.0 - 36.0 g/dL   RDW 16.1  09.6 - 04.5 %   Platelets 200  150 - 400 K/uL    Labs reviewed normal    Assessment/Plan: 1. Functional deficits secondary to Left MCA distribution infarct which require 3+ hours per day of interdisciplinary therapy in a comprehensive inpatient rehab setting. Physiatrist is providing close team supervision and 24 hour management of active medical problems listed below. Physiatrist and rehab team continue to assess barriers to discharge/monitor patient progress toward functional and medical goals.  FIM: FIM - Bathing Bathing Steps Patient Completed: Chest;Right Arm;Abdomen;Right upper leg;Left upper leg;Left lower leg (including foot) Bathing: 3: Mod-Patient completes 5-7 42f 10 parts or 50-74%  FIM - Upper Body Dressing/Undressing Upper body dressing/undressing steps patient completed: Thread/unthread left sleeve of pullover shirt/dress;Put head through opening of pull over shirt/dress Upper body dressing/undressing: 3: Mod-Patient completed 50-74% of tasks FIM - Lower Body Dressing/Undressing Lower body  dressing/undressing steps patient completed: Thread/unthread right pants leg;Thread/unthread left pants leg Lower body dressing/undressing: 2: Max-Patient completed 25-49% of tasks  FIM - Toileting Toileting steps completed by patient: Performs perineal hygiene;Adjust clothing after toileting Toileting: 3: Mod-Patient completed 2 of 3 steps  FIM - Diplomatic Services operational officer Devices: Psychiatrist Transfers: 1-Two helpers  FIM - Banker Devices: Arm rests Bed/Chair Transfer: 0: Activity did not occur  FIM - Locomotion: Wheelchair Distance: 200 Locomotion: Wheelchair: 4: Travels 150 ft or more: maneuvers on rugs and over door sillls with minimal assistance (Pt.>75%) FIM - Locomotion: Ambulation Locomotion: Ambulation Assistive Devices: Walker - Hemi;Orthosis Ambulation/Gait Assistance: Not tested (comment) Locomotion: Ambulation: 0: Activity did not occur  Comprehension Comprehension Mode: Auditory Comprehension: 4-Understands basic 75 - 89% of the time/requires cueing 10 - 24% of the time  Expression Expression Mode: Verbal Expression: 2-Expresses basic 25 - 49% of the time/requires cueing 50 - 75% of the time. Uses single words/gestures.  Social Interaction Social Interaction: 4-Interacts appropriately 75 - 89% of the time - Needs redirection for appropriate language or to initiate interaction.  Problem Solving Problem Solving: 3-Solves basic 50 - 74% of the time/requires cueing 25 - 49% of the time  Memory Memory: 3-Recognizes or recalls 50 - 74% of the time/requires cueing 25 - 49% of the time  Medical Problem List and Plan:  1. 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 DVT Prophylaxis/Anticoagulation: Eliquis  3. Pain Management: Tylenol as needed ,  4. Neuropsych: This patient is not capable of making decisions on his/her own behalf.   -ritalin for arousal and attention   5.  Dysphasia. Dysphagia 2 nectar thick liquids. Monitor for any signs of aspiration. Followup speech therapy  -encourage adequate PO intake   6. PAF/CAD/ CABG. Cardiac rate controlled. Continue Eliquis as directed. Followup cardiology services as needed  7. Hyperlipidemia. Crestor  8. Glaucoma. Continue eyedrops  9. Irritable bowel syndrome.Amitiza twice a day and MiraLAX daily.  10. UTI completed Abx 11.  Left 1 st MTP,PIP OA, cast shoe and diclofenac gel, can also use tylenol LOS (Days) 21 A FACE TO FACE EVALUATION WAS PERFORMED  Yalena Colon E 04/03/2013, 8:29 AM

## 2013-04-03 NOTE — Progress Notes (Signed)
Speech Language Pathology Daily Session Note  Patient Details  Name: Frank Weeks MRN: [id number] Date of Birth: 1929/07/21  Today's Date: 04/03/2013 Time: 1015-1100 Time Calculation (min): 45 min  Short Term Goals: Week 4: SLP Short Term Goal 1 (Week 4): Patient will perform oral motor and pharngeal strengthening exercises with Min assist multimodal cueing. SLP Short Term Goal 2 (Week 4): Patient will name basic ADL objects with Min assist semantic and phonemic cues. SLP Short Term Goal 3 (Week 4): Patient will self-monitor and correct vocal tone/quality with Mod assist clinician cues. SLP Short Term Goal 4 (Week 4): Patient will utilize speech intelligibility strategies at the phrase level with Min assist verbal cues. SLP Short Term Goal 5 (Week 4): Patient will demonstrate effecient mastication of regualr textures with Min assist clinician cues.  Skilled Therapeutic Interventions: Skilled treatment session focused on addressing cognitive-linguistic goals.  SLP facilitated session with Min assist verbal cues to utilize safe swallow strategies whiel consuming thin liquids via cup.  Patient exhibited throat clear on initital sip and then with clinician cues to use small sips patient demonstrated no further s/s of aspiration.  SLP also facilitated session with naming single and multi-syllabic words with Mod assist semantic and phonemic cues due to increased perseveration today.  SLP also targeted voicing with Max cues to fade singing phrases faded to stating phrases without utilizing glottic fry.  Continue with current plan of care.   FIM:  Comprehension Comprehension Mode: Auditory Comprehension: 4-Understands basic 75 - 89% of the time/requires cueing 10 - 24% of the time Expression Expression Mode: Verbal Expression: 2-Expresses basic 25 - 49% of the time/requires cueing 50 - 75% of the time. Uses single words/gestures. Social Interaction Social Interaction: 4-Interacts appropriately 75 -  89% of the time - Needs redirection for appropriate language or to initiate interaction. Problem Solving Problem Solving: 3-Solves basic 50 - 74% of the time/requires cueing 25 - 49% of the time Memory Memory: 3-Recognizes or recalls 50 - 74% of the time/requires cueing 25 - 49% of the time FIM - Eating Eating Activity: 4: Helper checks for pocketed food  Pain Pain Assessment Pain Assessment: No/denies pain  Therapy/Group: Individual Therapy  Charlane Ferretti., CCC-SLP [phone]  Alaylah Heatherington 04/03/2013, 1:57 PM

## 2013-04-03 NOTE — Discharge Summary (Signed)
NAME:  Frank Weeks, Frank Weeks NO.:  [id number]  MEDICAL RECORD NO.:  [id number]  LOCATION:  4034                         FACILITY:  [facility]  PHYSICIAN:  Erick Colace, M.D.DATE OF BIRTH:  1929-02-13  DATE OF ADMISSION:  03/13/2013 DATE OF DISCHARGE:  04/06/2013                              DISCHARGE SUMMARY   DISCHARGE DIAGNOSES: 1. 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 2. Eliquis for deep vein thrombosis prophylaxis, pain management,     dysphagia, PAF with history of coronary artery disease,     hyperlipidemia glaucoma, irritable bowel syndrome. 3. Urinary tract infection, completed. 4. Left first MTP, PIP, osteoarthritis.  HISTORY OF PRESENT ILLNESS:  This is an 79 year old right-handed male with a history of coronary artery disease with bypass grafting as well as pacemaker, subarachnoid hemorrhage 50 years ago, who was admitted Mar 08, 2013, with a fall reported by his wife with right-sided weakness and slurred speech.  Cranial CT scan showed developing low-density left basal ganglia external capsule region consistent with acute infarction. Carotid Dopplers with no ICA stenosis.  Echocardiogram with ejection fraction of 60% and normal systolic function.  CT angio of the head showed a filling defect throughout the left M1 segment compatible with embolus causing acute infarction in the left basal ganglia.  The patient did not receive tPA.  Neurology Service was consulted, placed on Eliquis for stroke prophylaxis in the setting of PAF.  The patient maintained on a dysphagia to nectar thick liquid diet per Speech Therapy.  Physical and occupational therapy on going.  The patient was admitted for comprehensive rehab program.  PAST MEDICAL HISTORY:  See discharge diagnoses.  SOCIAL HISTORY:  Lives with spouse.  FUNCTIONAL HISTORY:  Prior to admission, needed some assist for activities of daily living.  Functional status upon  admission to rehab services was +2 total assist for stand pivot transfers.  PHYSICAL EXAMINATION:  VITAL SIGNS:  Blood pressure 117/62, pulse 68, temperature 97, respirations 18. GENERAL:  This was an alert male, makes good eye contact with examiner. He had a right tongue deviation.  The patient was aphasic with occasional grunt.  He does use some simple one-word answers and phrases. He was inconsistent yes, nos. HEENT:  Pupils reactive to light. LUNGS:  Clear to auscultation. CARDIAC:  Regular rate and rhythm. ABDOMEN:  Soft, nontender.  Good bowel sounds.  REHABILITATION AND HOSPITAL COURSE:  The patient was admitted to inpatient rehab services with therapies initiated on a 3-hour daily basis consisting of physical therapy, occupational therapy, speech therapy, and 24-hour rehabilitation nursing.  The following issues were addressed during the patient's rehabilitation stay.  Pertaining to Mr. Bouwman, left basal ganglia external capsule infarct felt to be embolic. The patient remained on Eliquis.  He would follow up with Neurology Services.  His diet was slowly advanced to a mechanical soft thin liquid showing no signs of aspiration and follow up per speech therapy.  He had been placed on Ritalin to help obtain better focus for tasks as well as ongoing follow up per speech therapy for his aphagia, he was able to communicate some basic needs.  The patient remained on  Crestor for hyperlipidemia.  Complains of some left great toe pain.  X-ray showed no acute abnormalities there was some subtle degenerative changes.  He was placed in a cast shoe for comfort as well as Voltaren gel.  The patient did have a history of irritable bowel syndrome.  He remained on Amitiza and monitored with no nausea vomiting.  Cardiac rate remained controlled.  He did have a history of PAF.  The patient received weekly collaborative interdisciplinary team conferences to discuss estimated length of stay, family  teaching, and any barriers to discharge.  He was moderate assist bathing, moderate assist upper body dressing, total assist lower body dressing, max assist squat pivot transfers, +2 for hygiene with toileting, minimal assist wheelchair mobility, max assist, total assist transfers, bed mobility, and ambulation.  The patient did show progressive gains, however, wife was not able to provide the necessary assistance at home, felt skilled nursing facility was needed. Bed becoming available on April 06, 2013 discharge taking place at that time.  DISCHARGE MEDICATIONS: 1. Eliquis 5 mg p.o. b.i.d. 2. Voltaren gel 2 g q.i.d. to affected area. 3. Amitiza 24 mcg p.o. b.i.d. 4. Ritalin 10 mg p.o. b.i.d. at 7 a.m. and 12 noon. 5. Multivitamin 1 tablet p.o. daily. 6. Crestor 10 mg p.o. daily. 7. Travatan ophthalmic solution 0.004% 1 drop both eyes at bedtime.  DIET:  His diet was mechanical soft thin liquids.  SPECIAL INSTRUCTIONS:  The patient to follow up Dr. Erick Colace, at the outpatient rehab center as directed, Dr. Rodrigo Ran, medical management, Dr. Delia Heady, neurology Services 1 month call for appointment.     Mariam Dollar, P.A.   ______________________________ Erick Colace, M.D.    DA/MEDQ  D:  04/03/2013  T:  04/03/2013  Job:  [id number]  cc:   Erick Colace, M.D. Pramod P. Pearlean Brownie, MD [facility] Perini, M.D.

## 2013-04-04 ENCOUNTER — Inpatient Hospital Stay (HOSPITAL_COMMUNITY): Payer: Medicare Other

## 2013-04-04 ENCOUNTER — Inpatient Hospital Stay (HOSPITAL_COMMUNITY): Payer: Medicare Other | Admitting: Speech Pathology

## 2013-04-04 DIAGNOSIS — I6340 Cerebral infarction due to embolism of unspecified cerebral artery: Secondary | ICD-10-CM

## 2013-04-04 MED ORDER — HYDROCERIN EX CREA
TOPICAL_CREAM | Freq: Two times a day (BID) | CUTANEOUS | Status: DC
  Administered 2013-04-04: 14:00:00 via TOPICAL
  Administered 2013-04-05: 1 via TOPICAL
  Administered 2013-04-05 – 2013-04-06 (×2): via TOPICAL
  Filled 2013-04-04: qty 113

## 2013-04-04 NOTE — Progress Notes (Signed)
Physical Therapy Session Note  Patient Details  Name: Frank Weeks MRN: [id number] Date of Birth: 11/08/1928  Today's Date: 04/04/2013 Time: 4098-1191 Time Calculation (min): 28 min   Skilled Therapeutic Interventions/Progress Updates:    Session focused on neuro re-ed for trunk/postural control in standing, WB in stance through RLE, and gait using hemiwalker with manual and verbal cues for weightshifting, maintaining erect posture, activation at hip/knee, and total A to advance RLE.   Therapy Documentation Precautions:  Precautions Precautions: Fall Precaution Comments: expressive aphasia Restrictions Weight Bearing Restrictions: No   Pain:  Denies pain.  See FIM for current functional status  Therapy/Group: Individual Therapy  Karolee Stamps [facility] 04/04/2013, 12:09 PM

## 2013-04-04 NOTE — Progress Notes (Signed)
Patient ID: Frank Weeks, male   DOB: 1929/05/01, 79 y.o.   MRN: [id number] Subjective/Complaints: 79 y.o. right-handed male with history of CAD/CABG, PAF pacemaker, subarachnoid hemorrhage 50 years ago CVA. Admitted 03/08/2013 with fall reported by wife with right-sided weakness and slurred speech. Cranial CT showed developing low density left basal ganglia external capsule region consistent with acute infarct. Carotid Dopplers with no ICA stenosis. Echocardiogram with ejection fraction of 60% and normal systolic function. CT angiogram of the head showed a filling defect throughout the left M1 segment compatible with embolus causing acute infarct in left basal ganglia.  Subjective: Patient complains of left hip pain. No other specific complaints. Left hip pain is mild and only uncomfortable if he moves it in the "wrong direction".  Objective: Vital Signs: Blood pressure 88/57, pulse 74, temperature 97.9 F (36.6 C), temperature source Oral, resp. rate 19, height 5\' 8"  (1.727 m), weight 171 lb 8.3 oz (77.8 kg), SpO2 99.00%.  Elderly male in no acute distress. Chest clear to auscultation Cardiac exam S1-S2 are regular Abdominal exam active bowel sounds, soft Extremities no edema  Assessment/Plan: 1. Functional deficits secondary to Left MCA distribution infarct  Medical Problem List and Plan:  1. 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 DVT Prophylaxis/Anticoagulation: Eliquis  3. Pain Management: Tylenol as needed ,  4. Neuropsych: This patient is not capable of making decisions on his/her own behalf.   -ritalin for arousal and attention   5. Dysphasia. Dysphagia 2 nectar thick liquids. Monitor for any signs of aspiration. Followup speech therapy  -encourage adequate PO intake   6. PAF/CAD/ CABG. Cardiac rate controlled. Continue Eliquis as directed. Followup cardiology services as needed  7. Hyperlipidemia. Crestor  8. Glaucoma. Continue eyedrops  9.  Irritable bowel syndrome.Amitiza twice a day and MiraLAX daily.  10. UTI completed Abx 11.  Left 1 st MTP,PIP OA, cast shoe and diclofenac gel, can also use tylenol LOS (Days) 22 A FACE TO FACE EVALUATION WAS PERFORMED  SWORDS,BRUCE HENRY 04/04/2013, 9:01 AM

## 2013-04-04 NOTE — Progress Notes (Signed)
Speech Language Pathology Daily Session Note  Patient Details  Name: Frank Weeks MRN: [id number] Date of Birth: 1929/06/17  Today's Date: 04/04/2013 Time: 1500-1530 Time Calculation (min): 30 min  Short Term Goals: Week 4: SLP Short Term Goal 1 (Week 4): Patient will perform oral motor and pharngeal strengthening exercises with Min assist multimodal cueing. SLP Short Term Goal 2 (Week 4): Patient will name basic ADL objects with Min assist semantic and phonemic cues. SLP Short Term Goal 3 (Week 4): Patient will self-monitor and correct vocal tone/quality with Mod assist clinician cues. SLP Short Term Goal 4 (Week 4): Patient will utilize speech intelligibility strategies at the phrase level with Min assist verbal cues. SLP Short Term Goal 5 (Week 4): Patient will demonstrate effecient mastication of regualr textures with Min assist clinician cues.  Skilled Therapeutic Interventions: Therapeutic intervention for expressive language complete.  Treatment focused on increasing speech intelligibility by using compensatory strategies, and he required mod verbal cues for reminder to use those strategies.  Therapy also focused on working for correct articulator placement in order to better pronounce certain phonemes /f/ and /k/ specifically.  Overall he tried hard.  Family encouraged to encourage him to slow down, over-articulate, and pause between words to help increase intelligibility during conversation.    FIM:  Comprehension Comprehension Mode: Auditory Comprehension: 4-Understands basic 75 - 89% of the time/requires cueing 10 - 24% of the time Expression Expression Mode: Verbal Expression: 2-Expresses basic 25 - 49% of the time/requires cueing 50 - 75% of the time. Uses single words/gestures. Social Interaction Social Interaction: 4-Interacts appropriately 75 - 89% of the time - Needs redirection for appropriate language or to initiate interaction. Problem Solving Problem Solving: 3-Solves  basic 50 - 74% of the time/requires cueing 25 - 49% of the time Memory Memory: 3-Recognizes or recalls 50 - 74% of the time/requires cueing 25 - 49% of the time FIM - Eating Eating Activity: 4: Helper checks for pocketed food  Pain Pain Assessment Pain Assessment: No/denies pain  Therapy/Group: Individual Therapy  Lenny Pastel 04/04/2013, 5:29 PM

## 2013-04-05 ENCOUNTER — Inpatient Hospital Stay (HOSPITAL_COMMUNITY): Payer: Medicare Other | Admitting: *Deleted

## 2013-04-05 NOTE — Progress Notes (Signed)
Patient ID: Frank Weeks, male   DOB: 1929/06/03, 79 y.o.   MRN: [id number] Subjective/Complaints: 79 y.o. right-handed male with history of CAD/CABG, PAF pacemaker, subarachnoid hemorrhage 50 years ago CVA. Admitted 03/08/2013 with fall reported by wife with right-sided weakness and slurred speech. Cranial CT showed developing low density left basal ganglia external capsule region consistent with acute infarct. Carotid Dopplers with no ICA stenosis. Echocardiogram with ejection fraction of 60% and normal systolic function. CT angiogram of the head showed a filling defect throughout the left M1 segment compatible with embolus causing acute infarct in left basal ganglia.  Subjective: Patient feels well this morning. He is asking for breakfast. He denies any chest pain, shortness of breath, headache.  Objective: Vital Signs: Blood pressure 105/69, pulse 71, temperature 97.4 F (36.3 C), temperature source Oral, resp. rate 18, height 5\' 8"  (1.727 m), weight 171 lb 8.3 oz (77.8 kg), SpO2 95.00%.  Elderly male in no acute distress. Chest clear to auscultation Cardiac exam S1-S2 are regular Abdominal exam active bowel sounds, soft Extremities no edema  Assessment/Plan: 1. Functional deficits secondary to Left MCA distribution infarct  Medical Problem List and Plan:  1. 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 DVT Prophylaxis/Anticoagulation: Eliquis  3. Pain Management: Tylenol as needed ,  4. Neuropsych: This patient is not capable of making decisions on his/her own behalf.   -ritalin for arousal and attention   5. Dysphasia. Dysphagia 2 nectar thick liquids. Monitor for any signs of aspiration. Followup speech therapy  -encourage adequate PO intake   6. PAF/CAD/ CABG. Cardiac rate controlled. Continue Eliquis as directed. Followup cardiology services as needed  7. Hyperlipidemia. Crestor  8. Glaucoma. Continue eyedrops  9. Irritable bowel syndrome.Amitiza twice a  day and MiraLAX daily.  10. UTI completed Abx 11.  Left 1 st MTP,PIP OA, cast shoe and diclofenac gel, can also use tylenol LOS (Days) 23 A FACE TO FACE EVALUATION WAS PERFORMED  Frank Weeks 04/05/2013, 8:31 AM

## 2013-04-05 NOTE — Progress Notes (Signed)
Occupational Therapy Session Note  Patient Details  Name: Frank Weeks MRN: [id number] Date of Birth: Jul 07, 1929  Today's Date: 04/05/2013 Time:  - 1030-1130  (60 min)    Short Term Goals: Week 1:  OT Short Term Goal 1 (Week 1): Pt will sit EOB for 2 mins with supervision during self-care task  OT Short Term Goal 1 - Progress (Week 1): Progressing toward goal OT Short Term Goal 2 (Week 1): Pt will complete UB bathing with min assist and less than 25% cues OT Short Term Goal 2 - Progress (Week 1): Met OT Short Term Goal 3 (Week 1): Pt will complete UB dressing with min assist OT Short Term Goal 3 - Progress (Week 1): Progressing toward goal OT Short Term Goal 4 (Week 1): Pt will complete LB bathing with mod assist OT Short Term Goal 4 - Progress (Week 1): Progressing toward goal OT Short Term Goal 5 (Week 1): Pt will complete squat pivot transfer to [facility] with max assist of 1 person OT Short Term Goal 5 - Progress (Week 1): Met Week 2:  OT Short Term Goal 1 (Week 2): Pt will sit EOB for 2 mins with supervision during self-care task  OT Short Term Goal 1 - Progress (Week 2): Met OT Short Term Goal 2 (Week 2): Pt will complete UB dressing with min assist OT Short Term Goal 2 - Progress (Week 2): Progressing toward goal OT Short Term Goal 3 (Week 2): Pt will complete LB bathing with mod assist OT Short Term Goal 3 - Progress (Week 2): Met OT Short Term Goal 4 (Week 2): Pt will complete LB dressing with max assist OT Short Term Goal 4 - Progress (Week 2): Met OT Short Term Goal 5 (Week 2): Pt will complete toileting with max assist of one caregiver OT Short Term Goal 5 - Progress (Week 2): Met  Skilled Therapeutic Interventions/Progress Updates:    Addressed BADL at the sink, bed mobility, transfers, sit to stand, standing balance,RUE NMRE.  Transferred from supine to sit with mod assist.  Did stand pivot transfer to wheelchair with max assist with left leg buckling due to pain on left foot.   Nursing reported someone had stepped on his foot several weeks ago and it was still sore.  Pt. Needed  mod assist for sit to stand and mod assist plus verbal cues to stand erect.  He was able to reach and bath peri area while OT assisted him with balance.  Pt sat and crossed leg to wash and don pants.  He had difficulty reaching feet to to don socks and bath feet.  Encouraged pt to work on sitting unsupported while performing bathing tasks..  Pt. Tried to shave self with with safety razor using LUE but had difficulty due to apraxia.  Pt. Donned shirt with minimal assist using hemi techniques and minimal assist.  Pt. Tired at end of session ,but indicated he wanted to sit at the nursing station.     Therapy Documentation Precautions:  Precautions Precautions: Fall Precaution Comments: expressive aphasia Restrictions Weight Bearing Restrictions: No   Pain:  Left foot (6/10)  See FIM for current functional status  Therapy/Group: Individual Therapy  Humberto Seals 04/05/2013, 10:27 AM

## 2013-04-06 ENCOUNTER — Encounter (HOSPITAL_COMMUNITY): Payer: Medicare Other | Admitting: Occupational Therapy

## 2013-04-06 ENCOUNTER — Inpatient Hospital Stay (HOSPITAL_COMMUNITY): Payer: Medicare Other | Admitting: Speech Pathology

## 2013-04-06 ENCOUNTER — Inpatient Hospital Stay (HOSPITAL_COMMUNITY): Payer: Medicare Other | Admitting: Physical Therapy

## 2013-04-06 LAB — CBC
Hemoglobin: 14.1 g/dL (ref 13.0–17.0)
MCH: 31.8 pg (ref 26.0–34.0)
MCV: 95.3 fL (ref 78.0–100.0)
Platelets: 180 10*3/uL (ref 150–400)
RBC: 4.44 MIL/uL (ref 4.22–5.81)
WBC: 7.1 10*3/uL (ref 4.0–10.5)

## 2013-04-06 LAB — BASIC METABOLIC PANEL
CO2: 26 mEq/L (ref 19–32)
Chloride: 103 mEq/L (ref 96–112)
Glucose, Bld: 94 mg/dL (ref 70–99)
Potassium: 4.1 mEq/L (ref 3.5–5.1)
Sodium: 138 mEq/L (ref 135–145)

## 2013-04-06 NOTE — Plan of Care (Signed)
Problem: RH BOWEL ELIMINATION Goal: RH STG MANAGE BOWEL WITH ASSISTANCE STG Manage Bowel with maxx Assistance.  Outcome: Not Met (add Reason) Patient requires total assist for bowel management . Goal: RH STG MANAGE BOWEL W/MEDICATION W/ASSISTANCE STG Manage Bowel with Medication with mod Assistance.  Outcome: Not Met (add Reason) Patient required total assist to manage bowel management .  Problem: RH BLADDER ELIMINATION Goal: RH STG MANAGE BLADDER WITH EQUIPMENT WITH ASSISTANCE STG Manage Bladder With Equipment With maxx Assistance  Outcome: Not Met (add Reason) Patient unable to use urinal with left hand.  Problem: RH SAFETY Goal: RH STG DEMO UNDERSTANDING HOME SAFETY PRECAUTIONS Outcome: Not Applicable Date Met:  04/06/13 Discharge to nursing home   Problem: RH COGNITION-NURSING Goal: RH STG USES MEMORY AIDS/STRATEGIES W/ASSIST TO PROBLEM SOLVE STG Uses Memory Aids/Strategies With mod Assistance to Problem Solve.  Outcome: Not Met (add Reason) Patient required max cues to problem solve Goal: RH STG ANTICIPATES NEEDS/CALLS FOR ASSIST W/ASSIST/CUES STG Anticipates Needs/Calls for Assist With Assistance/Cues.  Outcome: Not Met (add Reason) Patient able to use call bell mod independent .   Problem: RH KNOWLEDGE DEFICIT Goal: RH STG INCREASE KNOWLEDGE OF DYSPHAGIA/FLUID INTAKE With teach back, patient's caregiver will demo understanding of dysphagia/fluid intake  Outcome: Not Met (add Reason) Patient's wife unable to verbalize understanding of dysphasia .

## 2013-04-06 NOTE — Progress Notes (Signed)
Occupational Therapy Discharge Summary  Patient Details  Name: Frank Weeks MRN: [id number] Date of Birth: [birth year]  Today's Date: 04/06/2013  Patient has met 9 of 9 long term goals due to improved activity tolerance, improved balance, improved attention and improved awareness.  Patient to discharge at overall Mod Assist level, improving from total +2 with mobility and self-care tasks.  Patient's care partner unavailable to provide the necessary physical and cognitive assistance at discharge.    Reasons goals not met: N/A  Recommendation:  Patient will benefit from ongoing skilled OT services in skilled nursing facility setting to continue to advance functional skills in the area of BADL and Reduce care partner burden.  Equipment: No equipment provided  Reasons for discharge: treatment goals met and discharge from hospital  Patient/family agrees with progress made and goals achieved: Yes  OT Discharge Precautions/Restrictions  Precautions Precautions: Fall Precaution Comments: expressive aphasia, apraxia Pain Pain Assessment Pain Assessment: No/denies pain Pain Score: 0-No pain ADL ADL Grooming: Minimal assistance Where Assessed-Grooming: Sitting at sink Upper Body Bathing: Minimal assistance Where Assessed-Upper Body Bathing: Sitting at sink Lower Body Bathing: Moderate assistance Where Assessed-Lower Body Bathing: Sitting at sink;Standing at sink Upper Body Dressing: Minimal assistance Where Assessed-Upper Body Dressing: Sitting at sink Lower Body Dressing: Moderate assistance Where Assessed-Lower Body Dressing: Sitting at sink;Standing at sink Toileting: Moderate assistance Where Assessed-Toileting: Bedside Commode Toilet Transfer: Moderate assistance Toilet Transfer Method: Squat pivot Toilet Transfer Equipment: Extra wide drop arm bedside commode ADL Comments: Pt demosntrating increased initiation with transfers and weight shifting to assist in LB dressing and  toileting tasks.  Hemi-technique initiated with LB dressing. Vision/Perception  Vision - History Baseline Vision: Wears glasses only for reading Patient Visual Report: No change from baseline Vision - Assessment Eye Alignment: Within Functional Limits Perception Perception: Impaired Inattention/Neglect: Does not attend to right side of body Praxis Praxis: Impaired Praxis Impairment Details: Motor planning;Perseveration  Sensation Sensation Light Touch: Impaired by gross assessment Light Touch Impaired Details: Impaired RLE Stereognosis: Not tested Hot/Cold: Not tested Proprioception: Impaired by gross assessment Coordination Gross Motor Movements are Fluid and Coordinated: No Fine Motor Movements are Fluid and Coordinated: No Coordination and Movement Description: Rt hemiplegia Finger Nose Finger Test: RUE flaccid, increased time with LUE Heel Shin Test: R LE hemiplegia Extremity/Trunk Assessment RUE Assessment RUE Assessment: Exceptions to [facility] (flaccid) LUE Assessment LUE Assessment: Within Functional Limits  See FIM for current functional status  Leonette Monarch 04/06/2013, 12:08 PM

## 2013-04-06 NOTE — Plan of Care (Signed)
Problem: RH BOWEL ELIMINATION Goal: RH STG MANAGE BOWEL WITH ASSISTANCE STG Manage Bowel with maxx Assistance.  Outcome: Not Met (add Reason) Patient requires total assist with bowel management and incontinent at times .

## 2013-04-06 NOTE — Progress Notes (Signed)
Patient discharged to [facility] nursing home at 1354 via EMS. Patient alert no complaint of pain . Patient and wife verbalized understanding of discharge to nursing home. Continue  With plan of care .           Cleotilde Neer

## 2013-04-06 NOTE — Progress Notes (Signed)
Social Work Discharge Note Discharge Note  The overall goal for the admission was met for:   Discharge location: No-[facility]  Length of Stay: Yes-24 DAYS  Discharge activity level: Yes-MIN/MOD LEVEL  Home/community participation: Yes  Services provided included: MD, RD, PT, OT, SLP, RN, CM, TR, Pharmacy, Neuropsych and SW  Financial Services: Private Insurance: BLUE MEDICARE  Follow-up services arranged: Other: NHP  Comments (or additional information):PT MADE GOOD PROGRESS HERE BUT NOT AT THE LEVEL WIFE CAN TAKE HIM HOME YET  Patient/Family verbalized understanding of follow-up arrangements: Yes  Individual responsible for coordination of the follow-up plan: DOROTHY-WIFE  Confirmed correct DME delivered: [name] 04/06/2013    [name]

## 2013-04-06 NOTE — Progress Notes (Signed)
Patient ID: Frank Weeks, male   DOB: 12-Jun-1929, 79 y.o.   MRN: [id number] Subjective/Complaints: 79 y.o. right-handed male with history of CAD/CABG, PAF pacemaker, subarachnoid hemorrhage 50 years ago CVA. Admitted 03/08/2013 with fall reported by wife with right-sided weakness and slurred speech. Cranial CT showed developing low density left basal ganglia external capsule region consistent with acute infarct. Carotid Dopplers with no ICA stenosis. Echocardiogram with ejection fraction of 60% and normal systolic function. CT angiogram of the head showed a filling defect throughout the left M1 segment compatible with embolus causing acute infarct in left basal ganglia.  Awakens to voice, aphasic Toe pain Left. Appetite improved Review of Systems  Unable to perform ROS: medical condition   Objective: Vital Signs: Blood pressure 111/74, pulse 69, temperature 97.3 F (36.3 C), temperature source Oral, resp. rate 19, height 5\' 8"  (1.727 m), weight 77.8 kg (171 lb 8.3 oz), SpO2 98.00%. No results found. Results for orders placed during the hospital encounter of 03/13/13 (from the past 72 hour(s))  BASIC METABOLIC PANEL     Status: Abnormal   Collection Time    04/06/13  5:30 AM      Result Value Range   Sodium 138  135 - 145 mEq/L   Potassium 4.1  3.5 - 5.1 mEq/L   Chloride 103  96 - 112 mEq/L   CO2 26  19 - 32 mEq/L   Glucose, Bld 94  70 - 99 mg/dL   BUN 14  6 - 23 mg/dL   Creatinine, Ser 4.09  0.50 - 1.35 mg/dL   Calcium 9.2  8.4 - 81.1 mg/dL   GFR calc non Af Amer 79 (*) >90 mL/min   GFR calc Af Amer >90  >90 mL/min   Comment:            The eGFR has been calculated     using the CKD EPI equation.     This calculation has not been     validated in all clinical     situations.     eGFR's persistently     <90 mL/min signify     possible Chronic Kidney Disease.  CBC     Status: None   Collection Time    04/06/13  5:30 AM      Result Value Range   WBC 7.1  4.0 - 10.5 K/uL   RBC  4.44  4.22 - 5.81 MIL/uL   Hemoglobin 14.1  13.0 - 17.0 g/dL   HCT 91.4  78.2 - 95.6 %   MCV 95.3  78.0 - 100.0 fL   MCH 31.8  26.0 - 34.0 pg   MCHC 33.3  30.0 - 36.0 g/dL   RDW 21.3  08.6 - 57.8 %   Platelets 180  150 - 400 K/uL    Labs reviewed normal    Assessment/Plan: 1. Functional deficits secondary to Left MCA distribution infarct  Stable for D/C to SNF  Stable for D/C today F/u PCP in 1-2 weeks F/u PM&R 3 weeks See D/C summary See D/C instructionsFIM: FIM - Bathing Bathing Steps Patient Completed: Chest;Right Arm;Abdomen;Right upper leg;Left upper leg;Front perineal area;Buttocks;Left lower leg (including foot) Bathing: 3: Mod-Patient completes 5-7 19f 10 parts or 50-74%  FIM - Upper Body Dressing/Undressing Upper body dressing/undressing steps patient completed: Thread/unthread left sleeve of pullover shirt/dress;Put head through opening of pull over shirt/dress;Pull shirt over trunk Upper body dressing/undressing: 4: Min-Patient completed 75 plus % of tasks FIM - Lower Body Dressing/Undressing Lower body dressing/undressing  steps patient completed: Thread/unthread left pants leg;Pull pants up/down Lower body dressing/undressing: 3: Mod-Patient completed 50-74% of tasks  FIM - Toileting Toileting steps completed by patient: Performs perineal hygiene;Adjust clothing after toileting Toileting: 0: Activity did not occur  FIM - Diplomatic Services operational officer Devices: Bedside commode Toilet Transfers: 0-Activity did not occur  FIM - Banker Devices: Arm rests Bed/Chair Transfer: 2: Bed > Chair or W/C: Max A (lift and lower assist);3: Supine > Sit: Mod A (lifting assist/Pt. 50-74%/lift 2 legs  FIM - Locomotion: Wheelchair Distance: 200 Locomotion: Wheelchair: 1: Total Assistance/staff pushes wheelchair (Pt<25%) FIM - Locomotion: Ambulation Locomotion: Ambulation Assistive Devices: Walker -  Hemi;Orthosis Ambulation/Gait Assistance: 2: Max assist Locomotion: Ambulation: 1: Travels less than 50 ft with maximal assistance (Pt: 25 - 49%)  Comprehension Comprehension Mode: Auditory Comprehension: 4-Understands basic 75 - 89% of the time/requires cueing 10 - 24% of the time  Expression Expression Mode: Verbal Expression: 2-Expresses basic 25 - 49% of the time/requires cueing 50 - 75% of the time. Uses single words/gestures.  Social Interaction Social Interaction: 4-Interacts appropriately 75 - 89% of the time - Needs redirection for appropriate language or to initiate interaction.  Problem Solving Problem Solving: 3-Solves basic 50 - 74% of the time/requires cueing 25 - 49% of the time  Memory Memory: 3-Recognizes or recalls 50 - 74% of the time/requires cueing 25 - 49% of the time  Medical Problem List and Plan:  1. 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 DVT Prophylaxis/Anticoagulation: Eliquis  3. Pain Management: Tylenol as needed ,  4. Neuropsych: This patient is not capable of making decisions on his/her own behalf.   -ritalin for arousal and attention   5. Dysphasia. Dysphagia 2 nectar thick liquids. Monitor for any signs of aspiration. Followup speech therapy  -encourage adequate PO intake   6. PAF/CAD/ CABG. Cardiac rate controlled. Continue Eliquis as directed. Followup cardiology services as needed  7. Hyperlipidemia. Crestor  8. Glaucoma. Continue eyedrops  9. Irritable bowel syndrome.Amitiza twice a day and MiraLAX daily.  10. UTI completed Abx 11.  Left 1 st MTP,PIP OA, cast shoe and diclofenac gel, can also use tylenol LOS (Days) 24 A FACE TO FACE EVALUATION WAS PERFORMED  Deric Bocock E 04/06/2013, 8:26 AM

## 2013-04-06 NOTE — Progress Notes (Signed)
Speech Language Pathology Daily Session Note & Discharge Summary   Patient Details  Name: Frank Weeks MRN: [id number] Date of Birth: [birth year]  Today's Date: 04/06/2013 Time: 1040-1100 Time Calculation (min): 20 min  Short Term Goals: Week 4: SLP Short Term Goal 1 (Week 4): Patient will perform oral motor and pharngeal strengthening exercises with Min assist multimodal cueing. SLP Short Term Goal 2 (Week 4): Patient will name basic ADL objects with Min assist semantic and phonemic cues. SLP Short Term Goal 3 (Week 4): Patient will self-monitor and correct vocal tone/quality with Mod assist clinician cues. SLP Short Term Goal 4 (Week 4): Patient will utilize speech intelligibility strategies at the phrase level with Min assist verbal cues. SLP Short Term Goal 5 (Week 4): Patient will demonstrate effecient mastication of regualr textures with Min assist clinician cues.  Skilled Therapeutic Interventions: Skilled treatment session focused on addressing cognitive-linguistic goals.  Patient required Supervision verbal cues to follow simple 2 step commands and accurately respond to yes/no questions.  Patient named objects with 80% accuracy and 100% following semantic cues; responsive naming 100%.  Patient continues to require Max cues to utilize best vocal quality during verbalizations that are faded from singing.  Patient has met his goals at this level of care; however, continues to require skilled SLP services to address continued aphasia, dysarthria, dysphonia, dysphagia and cognitive deficits to address higher level abilities and reduce burden of care prior to discharge home with wife.    FIM:  Comprehension Comprehension Mode: Auditory Comprehension: 5-Understands basic 90% of the time/requires cueing < 10% of the time Expression Expression Mode: Verbal Expression: 3-Expresses basic 50 - 74% of the time/requires cueing 25 - 50% of the time. Needs to repeat parts of sentences. Social  Interaction Social Interaction: 5-Interacts appropriately 90% of the time - Needs monitoring or encouragement for participation or interaction. Problem Solving Problem Solving: 4-Solves basic 75 - 89% of the time/requires cueing 10 - 24% of the time Memory Memory: 4-Recognizes or recalls 75 - 89% of the time/requires cueing 10 - 24% of the time FIM - Eating Eating Activity: 4: Helper checks for pocketed food  Pain Pain Assessment Pain Assessment: No/denies pain  Therapy/Group: Individual Therapy   Speech Language Pathology Discharge Summary  Patient Details  Name: Frank Weeks MRN: [id number] Date of Birth: 1929-06-05  Today's Date: 04/06/2013  Patient has met 8 of 8 long term goals.  Patient to discharge at overall Supervision;Min;Mod level.  Reasons goals not met: n/a   Clinical Impression/Discharge Summary: Patient met 8 out of 8 long term goals during CIR stay due to gains in toleration of Dys.3 textures and thin liquids via cup with Supervision, ability to name objects and express basic wants a needs, follow commands and accurately respond to yes/no questions.  Patient's apraxia appears to be resolving; however, dysphonia, dysarthria and aphasia persist and continue to impact his ability to verbally express wants and needs intelligibly.  Overall cognition requires Min assist due to increased recall and use of previously taught compensatory strategies.  As a result, he continues to require skilled SLP services to address continued aphasia, dysarthria, dysphonia, dysphagia and cognitive deficits to address higher level abilities and reduce burden of care prior to discharge home with wife.   Care Partner:  Caregiver Able to Provide Assistance: No  Type of Caregiver Assistance: Physical;Cognitive  Recommendation:  24 hour supervision/assistance;Skilled Nursing facility  Rationale for SLP Follow Up: Maximize functional communication;Maximize cognitive function and independence;Maximize  swallowing safety;Reduce caregiver  burden   Equipment: none   Reasons for discharge: Treatment goals met;Discharged from hospital   Patient/Family Agrees with Progress Made and Goals Achieved: Yes   See FIM for current functional status  Charlane Ferretti., CCC-SLP [phone]  [name] 04/06/2013, 2:22 PM

## 2013-04-06 NOTE — Progress Notes (Signed)
Patient refused all 1200 medications . Patient said "no medicine". Patient 's family in room packing for discharge to [facility] nursing home. Patient noted to be irritable . Support provided concerning discharge and patient continued to refuse medications . Continue with plan of care .                                             Frank Weeks

## 2013-04-06 NOTE — Progress Notes (Signed)
Occupational Therapy Session Note  Patient Details  Name: Frank Weeks MRN: [id number] Date of Birth: 04-May-1929  Today's Date: 04/06/2013 Time: 0916-1010 Time Calculation (min): 54 min  Short Term Goals: Week 4:  OT Short Term Goal 1 (Week 4): STG = LTGs with focus on transfers and caregiver of one  Skilled Therapeutic Interventions/Progress Updates:    Pt seen for ADL retraining with focus on bed mobility, transfers, hemi-technique with bathing and dressing, and awareness of RUE during self-care tasks.  Pt in bed upon arrival and willing to get OOB to participate.  Mod assist sidelying to sit and squat pivot transfer to w/c this session with pt demonstrating increased initiation, sequencing, and safety awareness with transfer.  Hemi-technique with LB bathing and dressing with pt requiring assistance to cross each leg over opposite knee secondary to tight hip flexors.  Pt able to don socks this session with backward chaining technique. Pt continues to require mod assist to maintain standing balance while pulling up pants.  Pt more verbal this session with increased comprehension due to slower speech and shorter sentences.  Pt setup in w/c with glasses and newspaper and safety belt donned.  Therapy Documentation Precautions:  Precautions Precautions: Fall Precaution Comments: expressive aphasia, apraxia Restrictions Weight Bearing Restrictions: No Pain: Pain Assessment Pain Assessment: No/denies pain Pain Score: 0-No pain ADL: ADL Grooming: Minimal assistance Where Assessed-Grooming: Sitting at sink Upper Body Bathing: Minimal assistance Where Assessed-Upper Body Bathing: Sitting at sink Lower Body Bathing: Moderate assistance Where Assessed-Lower Body Bathing: Sitting at sink;Standing at sink Upper Body Dressing: Minimal assistance Where Assessed-Upper Body Dressing: Sitting at sink Lower Body Dressing: Moderate assistance Where Assessed-Lower Body Dressing: Sitting at  sink;Standing at sink Toileting: Moderate assistance Where Assessed-Toileting: Bedside Commode Toilet Transfer: Moderate assistance Toilet Transfer Method: Squat pivot Toilet Transfer Equipment: Extra wide drop arm bedside commode ADL Comments: Pt demosntrating increased initiation with transfers and weight shifting to assist in LB dressing and toileting tasks.  Hemi-technique initiated with LB dressing.  See FIM for current functional status  Therapy/Group: Individual Therapy  Leonette Monarch 04/06/2013, 12:02 PM

## 2013-04-06 NOTE — Progress Notes (Signed)
Physical Therapy Discharge Summary  Patient Details  Name: Frank Weeks MRN: [id number] Date of Birth: 1929/05/13  Today's Date: 04/06/2013 Time: 1107-1210 Time Calculation (min): 63 min  Patient has met 6 of 6 long term goals due to improved activity tolerance, improved balance, improved postural control, increased strength, ability to compensate for deficits, functional use of  right lower extremity, improved attention, improved awareness and improved coordination.  Patient to discharge at a wheelchair level Mod Assist for basic transfers but continues to require max A for dynamic standing and gait.   Patient's care partner unable to provide the necessary physical and cognitive assistance at discharge secondary to patient's continued need for significant physical assistance.  Pt to D/C to SNF for more prolonged rehabilitation to reach higher level of functional independence and minimize caregiver burden prior to D/C home with wife.    Reasons goals not met: All goals met  Recommendation:  Patient will benefit from ongoing skilled PT services in skilled nursing facility setting to continue to advance safe functional mobility, address ongoing impairments in R sided hemiplegia with impaired motor control, timing, sequencing, apraxia, impaired postural control, balance, gait, and minimize fall risk.  Equipment: Deferred to next venue of care  Reasons for discharge: discharge from hospital  Patient/family agrees with progress made and goals achieved: Yes  PT Discharge Precautions/Restrictions Precautions Precautions: Fall Precaution Comments: expressive aphasia, apraxia Pain Pain Assessment Pain Assessment: No/denies pain Pain Score: 0-No pain Vision/Perception  Vision - History Baseline Vision: Wears glasses only for reading Patient Visual Report: No change from baseline Vision - Assessment Eye Alignment: Within Functional Limits Perception Perception:  Impaired Inattention/Neglect: Does not attend to right side of body Praxis Praxis: Impaired Praxis Impairment Details: Motor planning;Perseveration  Sensation Sensation Light Touch: Impaired by gross assessment Light Touch Impaired Details: Impaired RLE Stereognosis: Not tested Hot/Cold: Not tested Proprioception: Impaired by gross assessment Coordination Gross Motor Movements are Fluid and Coordinated: No Fine Motor Movements are Fluid and Coordinated: No Coordination and Movement Description: Rt hemiplegia Finger Nose Finger Test: RUE flaccid, increased time with LUE Heel Shin Test: R LE hemiplegia Motor  Motor Motor: Hemiplegia;Motor apraxia;Motor perseverations;Abnormal postural alignment and control Motor - Discharge Observations: R hemiplegia, apraxia, in standing presents with forwards and lateral leaning, verbal and motor perseverations  Mobility Bed Mobility Bed Mobility: Supine to Sit;Sit to Supine Supine to Sit: 3: Mod assist;HOB flat Sit to Supine: 3: Mod assist;HOB flat Sit to Supine - Details (indicate cue type and reason): Sit > supine on flat mat with mod A to bring RLE onto bed and to control trunk lowering to supine from long sitting Transfers Stand Pivot Transfers: 3: Mod assist;With armrests Stand Pivot Transfer Details (indicate cue type and reason): With LUE support on hemi walker; mod lifting assistance to stand from w/c and verbal. visual and tactile cues to mainain upright trunk, lateral weight shifting, safe advancement and placement of hemi walker and RLE Locomotion  Ambulation Ambulation/Gait Assistance: 2: Max assist Ambulation/Gait Assistance Details: Max A with LUE support on hemi walker or on wall rail with ALLARD brace RLE for foot clearance and knee control in stance.  Performed x 25' in controlled environment with max A and total verbal cues for upright posture, L lateral weight shifting, RLE advancement, placement and full anterior translation  over RLE and stabilization of RLE in stance for full LLE step length.  During gait training in hallway pt c/o incontinent bowel movement.  Returned to room and noted that BM  was loose and down pt's LLE.  Called for assistance and assisted pt with multiple sit <> stands from w/c with mod A and maintained static standing with UE support on sink with verbal cues to maintain upright posture and RLE extension while NT performed hygiene and clothing management.  Returned pt to w/c to eat lunch and sit with family until transport to SNF arrived. Stairs / Additional Locomotion Stairs: No Naval architect Assistance: 4: Min assist with verbal cues for sequencing to steer with L foot Wheelchair Propulsion: Left upper extremity;Left lower extremity Wheelchair Parts Management: Needs assistance Distance: 150  Trunk/Postural Assessment  Cervical Assessment Cervical Assessment: Within Functional Limits Thoracic Assessment Thoracic Assessment: Within Functional Limits Lumbar Assessment Lumbar Assessment: Within Functional Limits Postural Control Postural Control: Deficits on evaluation (increased use of head/shoulders to lead trunk righting)  Balance Static Sitting Balance Static Sitting - Balance Support: Feet supported Static Sitting - Level of Assistance: 5: Stand by assistance Dynamic Sitting Balance Dynamic Sitting - Balance Support: Left upper extremity supported;Feet unsupported Dynamic Sitting - Level of Assistance: 4: Min assist Static Standing Balance Static Standing - Balance Support: Left upper extremity supported Static Standing - Level of Assistance: 3: Mod assist Dynamic Standing Balance Dynamic Standing - Balance Support: Left upper extremity supported Dynamic Standing - Level of Assistance: 2: Max assist Dynamic Standing - Balance Activities: Lateral lean/weight shifting;Forward lean/weight shifting Extremity Assessment  RUE Assessment RUE Assessment: Exceptions to  [facility] (flaccid) LUE Assessment LUE Assessment: Within Functional Limits RLE Assessment RLE Assessment: Exceptions to [facility] RLE Strength RLE Overall Strength: Deficits RLE Overall Strength Comments: 2/5 hip flexion, hip ABD and ADD, knee flexion and hip/knee extension.  0/5 ankle DF and PF LLE Assessment LLE Assessment: Within Functional Limits  See FIM for current functional status  [name] [facility] 04/06/2013, 12:41 PM

## 2013-04-20 ENCOUNTER — Telehealth: Payer: Self-pay | Admitting: Internal Medicine

## 2013-04-20 NOTE — Telephone Encounter (Signed)
Spoke with wife.  Frank Weeks is post CVA with right sided weakness.  He is schedule for nerve stimulation of the right arm and wanted to know if it was safe with his PPM.  I instructed her to have them call [employer] brady tech support.

## 2013-04-20 NOTE — Telephone Encounter (Signed)
New Prob     Pts wife has some questions regarding husbands pacemaker post stroke. Please call.

## 2013-05-06 ENCOUNTER — Ambulatory Visit: Payer: Medicare Other | Admitting: Cardiology

## 2013-05-08 ENCOUNTER — Inpatient Hospital Stay: Payer: Medicare Other | Admitting: Physical Medicine & Rehabilitation

## 2013-05-20 ENCOUNTER — Telehealth: Payer: Self-pay | Admitting: Internal Medicine

## 2013-05-20 NOTE — Telephone Encounter (Signed)
Left message for [facility] to call. ? What is e-steam?

## 2013-05-20 NOTE — Telephone Encounter (Signed)
New problem  Outpatient therapy calling from [facility].   Faxing over request -  authorization to perform e-steam.

## 2013-06-18 NOTE — Telephone Encounter (Signed)
Paperwork filled out and faxed

## 2013-06-23 ENCOUNTER — Telehealth: Payer: Self-pay | Admitting: Internal Medicine

## 2013-06-23 NOTE — Telephone Encounter (Signed)
New Problem  Pt recently had a stroke// therapist wants to do an electoral stimulation on paralyzed areas. Requested call back to discuss

## 2013-06-26 ENCOUNTER — Telehealth: Payer: Self-pay | Admitting: *Deleted

## 2013-06-26 NOTE — Telephone Encounter (Signed)
Spoke with Frank Weeks concerning what to do for Frank Weeks SJM ppm during electrical stimulation therapy due to paralysis resulting from stroke. I called [facility] to speak with their therapist, Rodivan (sp?).

## 2013-06-26 NOTE — Telephone Encounter (Signed)
Charles in device clinic is handling follow up on this matter

## 2013-06-29 NOTE — Telephone Encounter (Signed)
LMOVM @ 4:12 06/29/13 for [facility] therapy to call me back directly.

## 2013-06-29 NOTE — Telephone Encounter (Signed)
LMOVM @10 :20 06/29/13.  At [facility] for their therapist to call me back directly.

## 2013-07-03 ENCOUNTER — Telehealth: Payer: Self-pay | Admitting: *Deleted

## 2013-07-03 NOTE — Telephone Encounter (Signed)
I called Mrs. Gladu letting her know [facility] will not return my msgs. Mrs. Kuehl said the [facility] physical therapist is not comfortable doing electrical stimulations with cardiac devices. I let her know our doctors will not be present while a procedure like this occurs. An industry rep can be contacted for further advice and also be present if necessary.  Mrs. Schellenberg will call us back Monday 07/06/13 with further instructions if the [facility] therapist wants to proceed.

## 2013-07-20 ENCOUNTER — Ambulatory Visit: Payer: Self-pay | Admitting: Neurology

## 2013-07-21 ENCOUNTER — Encounter: Payer: Self-pay | Admitting: *Deleted

## 2013-08-04 ENCOUNTER — Telehealth: Payer: Self-pay | Admitting: *Deleted

## 2013-08-04 NOTE — Telephone Encounter (Signed)
Per spouse, pt in [place] now due to major stroke in 02/2013. Stroke caused right side paralysis & speech difficulty. Pt now on Eliquis but plans to switch to Pradaxa due to cost. Pt would prefer to wait till March to return for a device check; does not have home monitoring.    I put in new recall for Dr. Graciela Husbands for 12/2013.

## 2013-10-16 DIAGNOSIS — R29818 Other symptoms and signs involving the nervous system: Secondary | ICD-10-CM | POA: Diagnosis not present

## 2013-10-16 DIAGNOSIS — R262 Difficulty in walking, not elsewhere classified: Secondary | ICD-10-CM | POA: Diagnosis not present

## 2013-10-16 DIAGNOSIS — R279 Unspecified lack of coordination: Secondary | ICD-10-CM | POA: Diagnosis not present

## 2013-10-16 DIAGNOSIS — M6281 Muscle weakness (generalized): Secondary | ICD-10-CM | POA: Diagnosis not present

## 2013-10-16 DIAGNOSIS — R269 Unspecified abnormalities of gait and mobility: Secondary | ICD-10-CM | POA: Diagnosis not present

## 2013-10-19 DIAGNOSIS — R269 Unspecified abnormalities of gait and mobility: Secondary | ICD-10-CM | POA: Diagnosis not present

## 2013-10-19 DIAGNOSIS — R279 Unspecified lack of coordination: Secondary | ICD-10-CM | POA: Diagnosis not present

## 2013-10-19 DIAGNOSIS — R29818 Other symptoms and signs involving the nervous system: Secondary | ICD-10-CM | POA: Diagnosis not present

## 2013-10-19 DIAGNOSIS — R262 Difficulty in walking, not elsewhere classified: Secondary | ICD-10-CM | POA: Diagnosis not present

## 2013-10-19 DIAGNOSIS — M6281 Muscle weakness (generalized): Secondary | ICD-10-CM | POA: Diagnosis not present

## 2013-10-20 DIAGNOSIS — R29818 Other symptoms and signs involving the nervous system: Secondary | ICD-10-CM | POA: Diagnosis not present

## 2013-10-20 DIAGNOSIS — R279 Unspecified lack of coordination: Secondary | ICD-10-CM | POA: Diagnosis not present

## 2013-10-20 DIAGNOSIS — R269 Unspecified abnormalities of gait and mobility: Secondary | ICD-10-CM | POA: Diagnosis not present

## 2013-10-20 DIAGNOSIS — R262 Difficulty in walking, not elsewhere classified: Secondary | ICD-10-CM | POA: Diagnosis not present

## 2013-10-20 DIAGNOSIS — M6281 Muscle weakness (generalized): Secondary | ICD-10-CM | POA: Diagnosis not present

## 2013-10-21 DIAGNOSIS — R29818 Other symptoms and signs involving the nervous system: Secondary | ICD-10-CM | POA: Diagnosis not present

## 2013-10-21 DIAGNOSIS — R279 Unspecified lack of coordination: Secondary | ICD-10-CM | POA: Diagnosis not present

## 2013-10-21 DIAGNOSIS — R262 Difficulty in walking, not elsewhere classified: Secondary | ICD-10-CM | POA: Diagnosis not present

## 2013-10-21 DIAGNOSIS — R269 Unspecified abnormalities of gait and mobility: Secondary | ICD-10-CM | POA: Diagnosis not present

## 2013-10-21 DIAGNOSIS — M6281 Muscle weakness (generalized): Secondary | ICD-10-CM | POA: Diagnosis not present

## 2013-10-22 DIAGNOSIS — R269 Unspecified abnormalities of gait and mobility: Secondary | ICD-10-CM | POA: Diagnosis not present

## 2013-10-22 DIAGNOSIS — R262 Difficulty in walking, not elsewhere classified: Secondary | ICD-10-CM | POA: Diagnosis not present

## 2013-10-22 DIAGNOSIS — R279 Unspecified lack of coordination: Secondary | ICD-10-CM | POA: Diagnosis not present

## 2013-10-22 DIAGNOSIS — M6281 Muscle weakness (generalized): Secondary | ICD-10-CM | POA: Diagnosis not present

## 2013-10-22 DIAGNOSIS — R29818 Other symptoms and signs involving the nervous system: Secondary | ICD-10-CM | POA: Diagnosis not present

## 2013-10-23 DIAGNOSIS — R262 Difficulty in walking, not elsewhere classified: Secondary | ICD-10-CM | POA: Diagnosis not present

## 2013-10-23 DIAGNOSIS — R29818 Other symptoms and signs involving the nervous system: Secondary | ICD-10-CM | POA: Diagnosis not present

## 2013-10-23 DIAGNOSIS — R269 Unspecified abnormalities of gait and mobility: Secondary | ICD-10-CM | POA: Diagnosis not present

## 2013-10-23 DIAGNOSIS — M6281 Muscle weakness (generalized): Secondary | ICD-10-CM | POA: Diagnosis not present

## 2013-10-23 DIAGNOSIS — R279 Unspecified lack of coordination: Secondary | ICD-10-CM | POA: Diagnosis not present

## 2013-10-26 DIAGNOSIS — R279 Unspecified lack of coordination: Secondary | ICD-10-CM | POA: Diagnosis not present

## 2013-10-26 DIAGNOSIS — R262 Difficulty in walking, not elsewhere classified: Secondary | ICD-10-CM | POA: Diagnosis not present

## 2013-10-26 DIAGNOSIS — R29818 Other symptoms and signs involving the nervous system: Secondary | ICD-10-CM | POA: Diagnosis not present

## 2013-10-26 DIAGNOSIS — R269 Unspecified abnormalities of gait and mobility: Secondary | ICD-10-CM | POA: Diagnosis not present

## 2013-10-26 DIAGNOSIS — M6281 Muscle weakness (generalized): Secondary | ICD-10-CM | POA: Diagnosis not present

## 2013-10-27 DIAGNOSIS — M6281 Muscle weakness (generalized): Secondary | ICD-10-CM | POA: Diagnosis not present

## 2013-10-27 DIAGNOSIS — R279 Unspecified lack of coordination: Secondary | ICD-10-CM | POA: Diagnosis not present

## 2013-10-27 DIAGNOSIS — R262 Difficulty in walking, not elsewhere classified: Secondary | ICD-10-CM | POA: Diagnosis not present

## 2013-10-27 DIAGNOSIS — R29818 Other symptoms and signs involving the nervous system: Secondary | ICD-10-CM | POA: Diagnosis not present

## 2013-10-27 DIAGNOSIS — R269 Unspecified abnormalities of gait and mobility: Secondary | ICD-10-CM | POA: Diagnosis not present

## 2013-10-28 DIAGNOSIS — R269 Unspecified abnormalities of gait and mobility: Secondary | ICD-10-CM | POA: Diagnosis not present

## 2013-10-28 DIAGNOSIS — M6281 Muscle weakness (generalized): Secondary | ICD-10-CM | POA: Diagnosis not present

## 2013-10-28 DIAGNOSIS — R262 Difficulty in walking, not elsewhere classified: Secondary | ICD-10-CM | POA: Diagnosis not present

## 2013-10-28 DIAGNOSIS — R29818 Other symptoms and signs involving the nervous system: Secondary | ICD-10-CM | POA: Diagnosis not present

## 2013-10-28 DIAGNOSIS — R279 Unspecified lack of coordination: Secondary | ICD-10-CM | POA: Diagnosis not present

## 2013-10-29 DIAGNOSIS — M6281 Muscle weakness (generalized): Secondary | ICD-10-CM | POA: Diagnosis not present

## 2013-10-29 DIAGNOSIS — R269 Unspecified abnormalities of gait and mobility: Secondary | ICD-10-CM | POA: Diagnosis not present

## 2013-10-29 DIAGNOSIS — R262 Difficulty in walking, not elsewhere classified: Secondary | ICD-10-CM | POA: Diagnosis not present

## 2013-10-29 DIAGNOSIS — R29818 Other symptoms and signs involving the nervous system: Secondary | ICD-10-CM | POA: Diagnosis not present

## 2013-10-29 DIAGNOSIS — R279 Unspecified lack of coordination: Secondary | ICD-10-CM | POA: Diagnosis not present

## 2013-10-30 DIAGNOSIS — M6281 Muscle weakness (generalized): Secondary | ICD-10-CM | POA: Diagnosis not present

## 2013-10-30 DIAGNOSIS — R29818 Other symptoms and signs involving the nervous system: Secondary | ICD-10-CM | POA: Diagnosis not present

## 2013-10-30 DIAGNOSIS — R279 Unspecified lack of coordination: Secondary | ICD-10-CM | POA: Diagnosis not present

## 2013-10-30 DIAGNOSIS — R269 Unspecified abnormalities of gait and mobility: Secondary | ICD-10-CM | POA: Diagnosis not present

## 2013-10-30 DIAGNOSIS — R262 Difficulty in walking, not elsewhere classified: Secondary | ICD-10-CM | POA: Diagnosis not present

## 2013-11-03 DIAGNOSIS — R269 Unspecified abnormalities of gait and mobility: Secondary | ICD-10-CM | POA: Diagnosis not present

## 2013-11-03 DIAGNOSIS — R262 Difficulty in walking, not elsewhere classified: Secondary | ICD-10-CM | POA: Diagnosis not present

## 2013-11-03 DIAGNOSIS — R279 Unspecified lack of coordination: Secondary | ICD-10-CM | POA: Diagnosis not present

## 2013-11-03 DIAGNOSIS — R29818 Other symptoms and signs involving the nervous system: Secondary | ICD-10-CM | POA: Diagnosis not present

## 2013-11-03 DIAGNOSIS — M6281 Muscle weakness (generalized): Secondary | ICD-10-CM | POA: Diagnosis not present

## 2013-11-04 DIAGNOSIS — R279 Unspecified lack of coordination: Secondary | ICD-10-CM | POA: Diagnosis not present

## 2013-11-04 DIAGNOSIS — M6281 Muscle weakness (generalized): Secondary | ICD-10-CM | POA: Diagnosis not present

## 2013-11-04 DIAGNOSIS — R269 Unspecified abnormalities of gait and mobility: Secondary | ICD-10-CM | POA: Diagnosis not present

## 2013-11-04 DIAGNOSIS — R262 Difficulty in walking, not elsewhere classified: Secondary | ICD-10-CM | POA: Diagnosis not present

## 2013-11-04 DIAGNOSIS — R29818 Other symptoms and signs involving the nervous system: Secondary | ICD-10-CM | POA: Diagnosis not present

## 2013-11-05 DIAGNOSIS — R269 Unspecified abnormalities of gait and mobility: Secondary | ICD-10-CM | POA: Diagnosis not present

## 2013-11-05 DIAGNOSIS — M6281 Muscle weakness (generalized): Secondary | ICD-10-CM | POA: Diagnosis not present

## 2013-11-05 DIAGNOSIS — R29818 Other symptoms and signs involving the nervous system: Secondary | ICD-10-CM | POA: Diagnosis not present

## 2013-11-05 DIAGNOSIS — R279 Unspecified lack of coordination: Secondary | ICD-10-CM | POA: Diagnosis not present

## 2013-11-05 DIAGNOSIS — R262 Difficulty in walking, not elsewhere classified: Secondary | ICD-10-CM | POA: Diagnosis not present

## 2013-11-06 DIAGNOSIS — R29818 Other symptoms and signs involving the nervous system: Secondary | ICD-10-CM | POA: Diagnosis not present

## 2013-11-06 DIAGNOSIS — M6281 Muscle weakness (generalized): Secondary | ICD-10-CM | POA: Diagnosis not present

## 2013-11-06 DIAGNOSIS — R279 Unspecified lack of coordination: Secondary | ICD-10-CM | POA: Diagnosis not present

## 2013-11-06 DIAGNOSIS — R262 Difficulty in walking, not elsewhere classified: Secondary | ICD-10-CM | POA: Diagnosis not present

## 2013-11-06 DIAGNOSIS — R269 Unspecified abnormalities of gait and mobility: Secondary | ICD-10-CM | POA: Diagnosis not present

## 2013-11-10 DIAGNOSIS — R269 Unspecified abnormalities of gait and mobility: Secondary | ICD-10-CM | POA: Diagnosis not present

## 2013-11-10 DIAGNOSIS — M6281 Muscle weakness (generalized): Secondary | ICD-10-CM | POA: Diagnosis not present

## 2013-11-10 DIAGNOSIS — R262 Difficulty in walking, not elsewhere classified: Secondary | ICD-10-CM | POA: Diagnosis not present

## 2013-11-10 DIAGNOSIS — R29818 Other symptoms and signs involving the nervous system: Secondary | ICD-10-CM | POA: Diagnosis not present

## 2013-11-10 DIAGNOSIS — R279 Unspecified lack of coordination: Secondary | ICD-10-CM | POA: Diagnosis not present

## 2013-11-11 DIAGNOSIS — R279 Unspecified lack of coordination: Secondary | ICD-10-CM | POA: Diagnosis not present

## 2013-11-11 DIAGNOSIS — M6281 Muscle weakness (generalized): Secondary | ICD-10-CM | POA: Diagnosis not present

## 2013-11-11 DIAGNOSIS — R29818 Other symptoms and signs involving the nervous system: Secondary | ICD-10-CM | POA: Diagnosis not present

## 2013-11-11 DIAGNOSIS — R269 Unspecified abnormalities of gait and mobility: Secondary | ICD-10-CM | POA: Diagnosis not present

## 2013-11-11 DIAGNOSIS — R262 Difficulty in walking, not elsewhere classified: Secondary | ICD-10-CM | POA: Diagnosis not present

## 2013-11-12 DIAGNOSIS — R269 Unspecified abnormalities of gait and mobility: Secondary | ICD-10-CM | POA: Diagnosis not present

## 2013-11-12 DIAGNOSIS — R262 Difficulty in walking, not elsewhere classified: Secondary | ICD-10-CM | POA: Diagnosis not present

## 2013-11-12 DIAGNOSIS — R29818 Other symptoms and signs involving the nervous system: Secondary | ICD-10-CM | POA: Diagnosis not present

## 2013-11-12 DIAGNOSIS — M6281 Muscle weakness (generalized): Secondary | ICD-10-CM | POA: Diagnosis not present

## 2013-11-12 DIAGNOSIS — R279 Unspecified lack of coordination: Secondary | ICD-10-CM | POA: Diagnosis not present

## 2013-11-13 DIAGNOSIS — R269 Unspecified abnormalities of gait and mobility: Secondary | ICD-10-CM | POA: Diagnosis not present

## 2013-11-13 DIAGNOSIS — M6281 Muscle weakness (generalized): Secondary | ICD-10-CM | POA: Diagnosis not present

## 2013-11-13 DIAGNOSIS — R262 Difficulty in walking, not elsewhere classified: Secondary | ICD-10-CM | POA: Diagnosis not present

## 2013-11-13 DIAGNOSIS — R29818 Other symptoms and signs involving the nervous system: Secondary | ICD-10-CM | POA: Diagnosis not present

## 2013-11-13 DIAGNOSIS — R279 Unspecified lack of coordination: Secondary | ICD-10-CM | POA: Diagnosis not present

## 2013-11-17 DIAGNOSIS — R29818 Other symptoms and signs involving the nervous system: Secondary | ICD-10-CM | POA: Diagnosis not present

## 2013-11-17 DIAGNOSIS — R269 Unspecified abnormalities of gait and mobility: Secondary | ICD-10-CM | POA: Diagnosis not present

## 2013-11-17 DIAGNOSIS — M6281 Muscle weakness (generalized): Secondary | ICD-10-CM | POA: Diagnosis not present

## 2013-11-17 DIAGNOSIS — R279 Unspecified lack of coordination: Secondary | ICD-10-CM | POA: Diagnosis not present

## 2013-11-17 DIAGNOSIS — R262 Difficulty in walking, not elsewhere classified: Secondary | ICD-10-CM | POA: Diagnosis not present

## 2013-11-18 DIAGNOSIS — M6281 Muscle weakness (generalized): Secondary | ICD-10-CM | POA: Diagnosis not present

## 2013-11-18 DIAGNOSIS — R269 Unspecified abnormalities of gait and mobility: Secondary | ICD-10-CM | POA: Diagnosis not present

## 2013-11-18 DIAGNOSIS — R262 Difficulty in walking, not elsewhere classified: Secondary | ICD-10-CM | POA: Diagnosis not present

## 2013-11-18 DIAGNOSIS — R279 Unspecified lack of coordination: Secondary | ICD-10-CM | POA: Diagnosis not present

## 2013-11-18 DIAGNOSIS — R29818 Other symptoms and signs involving the nervous system: Secondary | ICD-10-CM | POA: Diagnosis not present

## 2013-11-19 DIAGNOSIS — R269 Unspecified abnormalities of gait and mobility: Secondary | ICD-10-CM | POA: Diagnosis not present

## 2013-11-19 DIAGNOSIS — R29818 Other symptoms and signs involving the nervous system: Secondary | ICD-10-CM | POA: Diagnosis not present

## 2013-11-19 DIAGNOSIS — R262 Difficulty in walking, not elsewhere classified: Secondary | ICD-10-CM | POA: Diagnosis not present

## 2013-11-19 DIAGNOSIS — R279 Unspecified lack of coordination: Secondary | ICD-10-CM | POA: Diagnosis not present

## 2013-11-19 DIAGNOSIS — M6281 Muscle weakness (generalized): Secondary | ICD-10-CM | POA: Diagnosis not present

## 2013-11-21 DIAGNOSIS — R262 Difficulty in walking, not elsewhere classified: Secondary | ICD-10-CM | POA: Diagnosis not present

## 2013-11-21 DIAGNOSIS — R279 Unspecified lack of coordination: Secondary | ICD-10-CM | POA: Diagnosis not present

## 2013-11-21 DIAGNOSIS — M6281 Muscle weakness (generalized): Secondary | ICD-10-CM | POA: Diagnosis not present

## 2013-11-21 DIAGNOSIS — R29818 Other symptoms and signs involving the nervous system: Secondary | ICD-10-CM | POA: Diagnosis not present

## 2013-11-21 DIAGNOSIS — R269 Unspecified abnormalities of gait and mobility: Secondary | ICD-10-CM | POA: Diagnosis not present

## 2013-11-24 DIAGNOSIS — R29818 Other symptoms and signs involving the nervous system: Secondary | ICD-10-CM | POA: Diagnosis not present

## 2013-11-24 DIAGNOSIS — R279 Unspecified lack of coordination: Secondary | ICD-10-CM | POA: Diagnosis not present

## 2013-11-24 DIAGNOSIS — R262 Difficulty in walking, not elsewhere classified: Secondary | ICD-10-CM | POA: Diagnosis not present

## 2013-11-24 DIAGNOSIS — R269 Unspecified abnormalities of gait and mobility: Secondary | ICD-10-CM | POA: Diagnosis not present

## 2013-11-24 DIAGNOSIS — M6281 Muscle weakness (generalized): Secondary | ICD-10-CM | POA: Diagnosis not present

## 2013-11-25 DIAGNOSIS — R262 Difficulty in walking, not elsewhere classified: Secondary | ICD-10-CM | POA: Diagnosis not present

## 2013-11-25 DIAGNOSIS — R29818 Other symptoms and signs involving the nervous system: Secondary | ICD-10-CM | POA: Diagnosis not present

## 2013-11-25 DIAGNOSIS — M6281 Muscle weakness (generalized): Secondary | ICD-10-CM | POA: Diagnosis not present

## 2013-11-25 DIAGNOSIS — R269 Unspecified abnormalities of gait and mobility: Secondary | ICD-10-CM | POA: Diagnosis not present

## 2013-11-25 DIAGNOSIS — R279 Unspecified lack of coordination: Secondary | ICD-10-CM | POA: Diagnosis not present

## 2013-11-26 DIAGNOSIS — R262 Difficulty in walking, not elsewhere classified: Secondary | ICD-10-CM | POA: Diagnosis not present

## 2013-11-26 DIAGNOSIS — R279 Unspecified lack of coordination: Secondary | ICD-10-CM | POA: Diagnosis not present

## 2013-11-26 DIAGNOSIS — M6281 Muscle weakness (generalized): Secondary | ICD-10-CM | POA: Diagnosis not present

## 2013-11-26 DIAGNOSIS — R269 Unspecified abnormalities of gait and mobility: Secondary | ICD-10-CM | POA: Diagnosis not present

## 2013-11-26 DIAGNOSIS — R29818 Other symptoms and signs involving the nervous system: Secondary | ICD-10-CM | POA: Diagnosis not present

## 2013-11-27 DIAGNOSIS — M6281 Muscle weakness (generalized): Secondary | ICD-10-CM | POA: Diagnosis not present

## 2013-11-27 DIAGNOSIS — R279 Unspecified lack of coordination: Secondary | ICD-10-CM | POA: Diagnosis not present

## 2013-11-27 DIAGNOSIS — R29818 Other symptoms and signs involving the nervous system: Secondary | ICD-10-CM | POA: Diagnosis not present

## 2013-11-27 DIAGNOSIS — R262 Difficulty in walking, not elsewhere classified: Secondary | ICD-10-CM | POA: Diagnosis not present

## 2013-11-27 DIAGNOSIS — R269 Unspecified abnormalities of gait and mobility: Secondary | ICD-10-CM | POA: Diagnosis not present

## 2013-12-02 DIAGNOSIS — M6281 Muscle weakness (generalized): Secondary | ICD-10-CM | POA: Diagnosis not present

## 2013-12-02 DIAGNOSIS — R269 Unspecified abnormalities of gait and mobility: Secondary | ICD-10-CM | POA: Diagnosis not present

## 2013-12-02 DIAGNOSIS — R262 Difficulty in walking, not elsewhere classified: Secondary | ICD-10-CM | POA: Diagnosis not present

## 2013-12-02 DIAGNOSIS — R279 Unspecified lack of coordination: Secondary | ICD-10-CM | POA: Diagnosis not present

## 2013-12-02 DIAGNOSIS — R29818 Other symptoms and signs involving the nervous system: Secondary | ICD-10-CM | POA: Diagnosis not present

## 2013-12-03 DIAGNOSIS — R269 Unspecified abnormalities of gait and mobility: Secondary | ICD-10-CM | POA: Diagnosis not present

## 2013-12-03 DIAGNOSIS — R29818 Other symptoms and signs involving the nervous system: Secondary | ICD-10-CM | POA: Diagnosis not present

## 2013-12-03 DIAGNOSIS — R279 Unspecified lack of coordination: Secondary | ICD-10-CM | POA: Diagnosis not present

## 2013-12-03 DIAGNOSIS — R262 Difficulty in walking, not elsewhere classified: Secondary | ICD-10-CM | POA: Diagnosis not present

## 2013-12-03 DIAGNOSIS — M6281 Muscle weakness (generalized): Secondary | ICD-10-CM | POA: Diagnosis not present

## 2013-12-04 DIAGNOSIS — R269 Unspecified abnormalities of gait and mobility: Secondary | ICD-10-CM | POA: Diagnosis not present

## 2013-12-04 DIAGNOSIS — R29818 Other symptoms and signs involving the nervous system: Secondary | ICD-10-CM | POA: Diagnosis not present

## 2013-12-04 DIAGNOSIS — M6281 Muscle weakness (generalized): Secondary | ICD-10-CM | POA: Diagnosis not present

## 2013-12-04 DIAGNOSIS — R279 Unspecified lack of coordination: Secondary | ICD-10-CM | POA: Diagnosis not present

## 2013-12-04 DIAGNOSIS — R262 Difficulty in walking, not elsewhere classified: Secondary | ICD-10-CM | POA: Diagnosis not present

## 2013-12-07 DIAGNOSIS — R279 Unspecified lack of coordination: Secondary | ICD-10-CM | POA: Diagnosis not present

## 2013-12-07 DIAGNOSIS — M6281 Muscle weakness (generalized): Secondary | ICD-10-CM | POA: Diagnosis not present

## 2013-12-07 DIAGNOSIS — R269 Unspecified abnormalities of gait and mobility: Secondary | ICD-10-CM | POA: Diagnosis not present

## 2013-12-07 DIAGNOSIS — R262 Difficulty in walking, not elsewhere classified: Secondary | ICD-10-CM | POA: Diagnosis not present

## 2013-12-07 DIAGNOSIS — R29818 Other symptoms and signs involving the nervous system: Secondary | ICD-10-CM | POA: Diagnosis not present

## 2013-12-08 DIAGNOSIS — R279 Unspecified lack of coordination: Secondary | ICD-10-CM | POA: Diagnosis not present

## 2013-12-08 DIAGNOSIS — R269 Unspecified abnormalities of gait and mobility: Secondary | ICD-10-CM | POA: Diagnosis not present

## 2013-12-08 DIAGNOSIS — R29818 Other symptoms and signs involving the nervous system: Secondary | ICD-10-CM | POA: Diagnosis not present

## 2013-12-08 DIAGNOSIS — M6281 Muscle weakness (generalized): Secondary | ICD-10-CM | POA: Diagnosis not present

## 2013-12-08 DIAGNOSIS — R262 Difficulty in walking, not elsewhere classified: Secondary | ICD-10-CM | POA: Diagnosis not present

## 2013-12-09 DIAGNOSIS — R279 Unspecified lack of coordination: Secondary | ICD-10-CM | POA: Diagnosis not present

## 2013-12-09 DIAGNOSIS — M6281 Muscle weakness (generalized): Secondary | ICD-10-CM | POA: Diagnosis not present

## 2013-12-09 DIAGNOSIS — R29818 Other symptoms and signs involving the nervous system: Secondary | ICD-10-CM | POA: Diagnosis not present

## 2013-12-09 DIAGNOSIS — R269 Unspecified abnormalities of gait and mobility: Secondary | ICD-10-CM | POA: Diagnosis not present

## 2013-12-09 DIAGNOSIS — R262 Difficulty in walking, not elsewhere classified: Secondary | ICD-10-CM | POA: Diagnosis not present

## 2013-12-11 DIAGNOSIS — R279 Unspecified lack of coordination: Secondary | ICD-10-CM | POA: Diagnosis not present

## 2013-12-11 DIAGNOSIS — M6281 Muscle weakness (generalized): Secondary | ICD-10-CM | POA: Diagnosis not present

## 2013-12-11 DIAGNOSIS — R269 Unspecified abnormalities of gait and mobility: Secondary | ICD-10-CM | POA: Diagnosis not present

## 2013-12-11 DIAGNOSIS — R29818 Other symptoms and signs involving the nervous system: Secondary | ICD-10-CM | POA: Diagnosis not present

## 2013-12-11 DIAGNOSIS — R262 Difficulty in walking, not elsewhere classified: Secondary | ICD-10-CM | POA: Diagnosis not present

## 2013-12-14 DIAGNOSIS — R279 Unspecified lack of coordination: Secondary | ICD-10-CM | POA: Diagnosis not present

## 2013-12-14 DIAGNOSIS — R269 Unspecified abnormalities of gait and mobility: Secondary | ICD-10-CM | POA: Diagnosis not present

## 2013-12-14 DIAGNOSIS — R262 Difficulty in walking, not elsewhere classified: Secondary | ICD-10-CM | POA: Diagnosis not present

## 2013-12-14 DIAGNOSIS — R29818 Other symptoms and signs involving the nervous system: Secondary | ICD-10-CM | POA: Diagnosis not present

## 2013-12-14 DIAGNOSIS — M6281 Muscle weakness (generalized): Secondary | ICD-10-CM | POA: Diagnosis not present

## 2013-12-15 DIAGNOSIS — R269 Unspecified abnormalities of gait and mobility: Secondary | ICD-10-CM | POA: Diagnosis not present

## 2013-12-15 DIAGNOSIS — R279 Unspecified lack of coordination: Secondary | ICD-10-CM | POA: Diagnosis not present

## 2013-12-15 DIAGNOSIS — R262 Difficulty in walking, not elsewhere classified: Secondary | ICD-10-CM | POA: Diagnosis not present

## 2013-12-15 DIAGNOSIS — R29818 Other symptoms and signs involving the nervous system: Secondary | ICD-10-CM | POA: Diagnosis not present

## 2013-12-15 DIAGNOSIS — M6281 Muscle weakness (generalized): Secondary | ICD-10-CM | POA: Diagnosis not present

## 2013-12-16 DIAGNOSIS — R279 Unspecified lack of coordination: Secondary | ICD-10-CM | POA: Diagnosis not present

## 2013-12-16 DIAGNOSIS — R262 Difficulty in walking, not elsewhere classified: Secondary | ICD-10-CM | POA: Diagnosis not present

## 2013-12-16 DIAGNOSIS — R29818 Other symptoms and signs involving the nervous system: Secondary | ICD-10-CM | POA: Diagnosis not present

## 2013-12-16 DIAGNOSIS — R269 Unspecified abnormalities of gait and mobility: Secondary | ICD-10-CM | POA: Diagnosis not present

## 2013-12-16 DIAGNOSIS — M6281 Muscle weakness (generalized): Secondary | ICD-10-CM | POA: Diagnosis not present

## 2013-12-17 DIAGNOSIS — H353190 Nonexudative age-related macular degeneration, unspecified eye, stage unspecified: Secondary | ICD-10-CM | POA: Diagnosis not present

## 2013-12-18 DIAGNOSIS — M6281 Muscle weakness (generalized): Secondary | ICD-10-CM | POA: Diagnosis not present

## 2013-12-18 DIAGNOSIS — R269 Unspecified abnormalities of gait and mobility: Secondary | ICD-10-CM | POA: Diagnosis not present

## 2013-12-18 DIAGNOSIS — R29818 Other symptoms and signs involving the nervous system: Secondary | ICD-10-CM | POA: Diagnosis not present

## 2013-12-18 DIAGNOSIS — R262 Difficulty in walking, not elsewhere classified: Secondary | ICD-10-CM | POA: Diagnosis not present

## 2013-12-18 DIAGNOSIS — R279 Unspecified lack of coordination: Secondary | ICD-10-CM | POA: Diagnosis not present

## 2013-12-21 DIAGNOSIS — R262 Difficulty in walking, not elsewhere classified: Secondary | ICD-10-CM | POA: Diagnosis not present

## 2013-12-21 DIAGNOSIS — R269 Unspecified abnormalities of gait and mobility: Secondary | ICD-10-CM | POA: Diagnosis not present

## 2013-12-21 DIAGNOSIS — R29818 Other symptoms and signs involving the nervous system: Secondary | ICD-10-CM | POA: Diagnosis not present

## 2013-12-21 DIAGNOSIS — R279 Unspecified lack of coordination: Secondary | ICD-10-CM | POA: Diagnosis not present

## 2013-12-21 DIAGNOSIS — M6281 Muscle weakness (generalized): Secondary | ICD-10-CM | POA: Diagnosis not present

## 2013-12-22 DIAGNOSIS — R262 Difficulty in walking, not elsewhere classified: Secondary | ICD-10-CM | POA: Diagnosis not present

## 2013-12-22 DIAGNOSIS — R269 Unspecified abnormalities of gait and mobility: Secondary | ICD-10-CM | POA: Diagnosis not present

## 2013-12-22 DIAGNOSIS — R279 Unspecified lack of coordination: Secondary | ICD-10-CM | POA: Diagnosis not present

## 2013-12-22 DIAGNOSIS — M6281 Muscle weakness (generalized): Secondary | ICD-10-CM | POA: Diagnosis not present

## 2013-12-22 DIAGNOSIS — R29818 Other symptoms and signs involving the nervous system: Secondary | ICD-10-CM | POA: Diagnosis not present

## 2013-12-23 DIAGNOSIS — M6281 Muscle weakness (generalized): Secondary | ICD-10-CM | POA: Diagnosis not present

## 2013-12-23 DIAGNOSIS — R29818 Other symptoms and signs involving the nervous system: Secondary | ICD-10-CM | POA: Diagnosis not present

## 2013-12-23 DIAGNOSIS — R269 Unspecified abnormalities of gait and mobility: Secondary | ICD-10-CM | POA: Diagnosis not present

## 2013-12-23 DIAGNOSIS — R279 Unspecified lack of coordination: Secondary | ICD-10-CM | POA: Diagnosis not present

## 2013-12-23 DIAGNOSIS — R262 Difficulty in walking, not elsewhere classified: Secondary | ICD-10-CM | POA: Diagnosis not present

## 2013-12-28 DIAGNOSIS — R269 Unspecified abnormalities of gait and mobility: Secondary | ICD-10-CM | POA: Diagnosis not present

## 2013-12-28 DIAGNOSIS — R29818 Other symptoms and signs involving the nervous system: Secondary | ICD-10-CM | POA: Diagnosis not present

## 2013-12-28 DIAGNOSIS — M6281 Muscle weakness (generalized): Secondary | ICD-10-CM | POA: Diagnosis not present

## 2013-12-28 DIAGNOSIS — R262 Difficulty in walking, not elsewhere classified: Secondary | ICD-10-CM | POA: Diagnosis not present

## 2013-12-28 DIAGNOSIS — R279 Unspecified lack of coordination: Secondary | ICD-10-CM | POA: Diagnosis not present

## 2013-12-29 DIAGNOSIS — R29818 Other symptoms and signs involving the nervous system: Secondary | ICD-10-CM | POA: Diagnosis not present

## 2013-12-29 DIAGNOSIS — R269 Unspecified abnormalities of gait and mobility: Secondary | ICD-10-CM | POA: Diagnosis not present

## 2013-12-29 DIAGNOSIS — R262 Difficulty in walking, not elsewhere classified: Secondary | ICD-10-CM | POA: Diagnosis not present

## 2013-12-29 DIAGNOSIS — R279 Unspecified lack of coordination: Secondary | ICD-10-CM | POA: Diagnosis not present

## 2013-12-29 DIAGNOSIS — M6281 Muscle weakness (generalized): Secondary | ICD-10-CM | POA: Diagnosis not present

## 2013-12-30 DIAGNOSIS — R279 Unspecified lack of coordination: Secondary | ICD-10-CM | POA: Diagnosis not present

## 2013-12-30 DIAGNOSIS — M6281 Muscle weakness (generalized): Secondary | ICD-10-CM | POA: Diagnosis not present

## 2013-12-30 DIAGNOSIS — R29818 Other symptoms and signs involving the nervous system: Secondary | ICD-10-CM | POA: Diagnosis not present

## 2013-12-30 DIAGNOSIS — R262 Difficulty in walking, not elsewhere classified: Secondary | ICD-10-CM | POA: Diagnosis not present

## 2013-12-30 DIAGNOSIS — R269 Unspecified abnormalities of gait and mobility: Secondary | ICD-10-CM | POA: Diagnosis not present

## 2014-02-08 DIAGNOSIS — R627 Adult failure to thrive: Secondary | ICD-10-CM | POA: Diagnosis not present

## 2014-02-22 DIAGNOSIS — F4320 Adjustment disorder, unspecified: Secondary | ICD-10-CM | POA: Diagnosis not present

## 2014-03-18 DIAGNOSIS — B351 Tinea unguium: Secondary | ICD-10-CM | POA: Diagnosis not present

## 2014-03-18 DIAGNOSIS — L84 Corns and callosities: Secondary | ICD-10-CM | POA: Diagnosis not present

## 2014-03-18 DIAGNOSIS — E118 Type 2 diabetes mellitus with unspecified complications: Secondary | ICD-10-CM | POA: Diagnosis not present

## 2014-03-18 DIAGNOSIS — I739 Peripheral vascular disease, unspecified: Secondary | ICD-10-CM | POA: Diagnosis not present

## 2014-03-24 IMAGING — CT CT ANGIO HEAD
1 of 13 series · 4 of 33 positions shown · IV contrast (CONTRAST)
Comparison: CT head 03/09/2013

CLINICAL DATA: Stroke.  Right-sided weakness and expressive
aphasia.  Pacemaker

CT ANGIOGRAPHY HEAD
TECHNIQUE: Multidetector CT imaging of the head was performed
using the standard protocol during bolus administration of
intravenous contrast.  Multiplanar CT image reconstructions
including MIPs were obtained to evaluate the vascular anatomy.
Contrast: 80mL OMNIPAQUE IOHEXOL 350 MG/ML SOLN

[mpr, ax 1x1 mpr, axial · axial · 0.46mm/px · z∈[+1034,+1130]mm · 4 of 160 slices shown]
[im 32/160  soft-tissue]
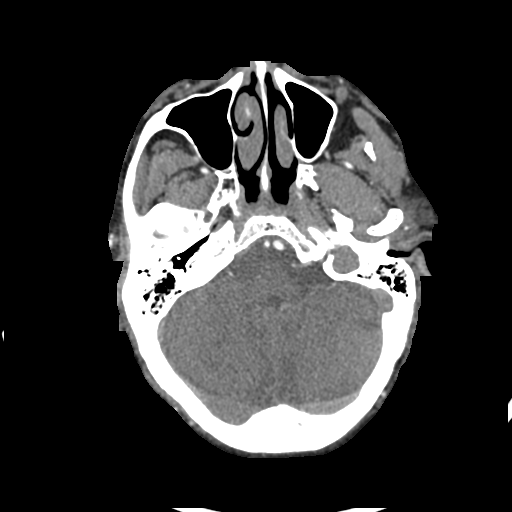
[im 64/160  bone]
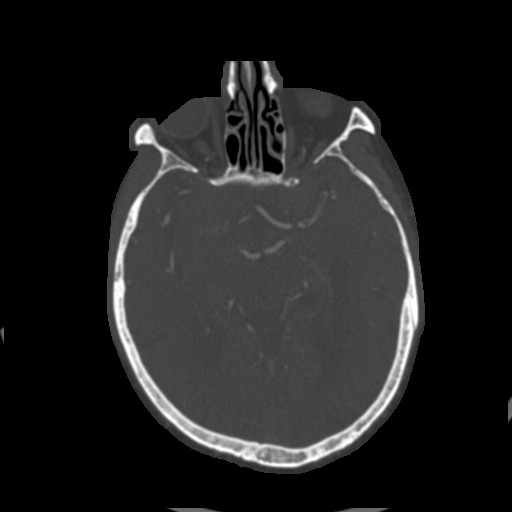
[im 96/160  soft-tissue]
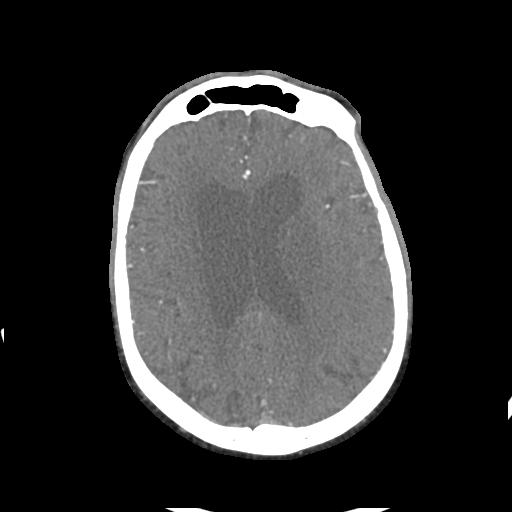
[im 128/160  bone]
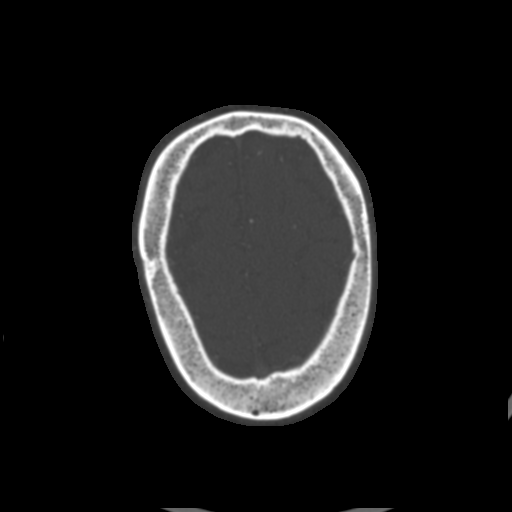

[4 of 33 positions shown; findings below may reference images not displayed]

FINDINGS: Progression of ill-defined hypodensity in the left basal
ganglia consistent with acute infarct.  This involves essentially
all of the putamen.  The left globus pallidus may also be involved.
No other acute infarct or hemorrhage.  There is generalized atrophy
and chronic microvascular ischemia in the white matter.  Chronic
infarct right cerebellum is unchanged.

Both vertebral arteries are patent to the basilar.  There is mild
atherosclerotic disease in the distal basilar bilaterally.  Right
PICA is widely patent.  Left PICA not visualized.  PICA and
superior cerebellar arteries are patent bilaterally.  Posterior
cerebral arteries are patent bilaterally.

Right cavernous carotid is patent with atherosclerotic disease and
mild stenosis.  Right anterior and  middle cerebral arteries are
patent bilaterally.

Left cavernous carotid shows mild atherosclerotic disease and mild
stenosis.  There is filling defect in the left M1 segment
compatible with thrombus or embolus.  The patient does have atrial
fibrillation suggesting   embolus.  This is nonobstructing and
there is flow in left middle cerebral artery branches which appear
normal.  Left anterior cerebral artery is widely patent.

Negative for cerebral aneurysm.

 Review of the MIP images confirms the above findings.
IMPRESSION: Filling defect throughout the left M1 segment compatible with an
embolus causing acute infarct in the left basal ganglia.

Mild atherosclerotic disease in the distal vertebral arteries and
the cavernous carotid artery bilaterally.

Negative for aneurysm.

## 2014-03-25 IMAGING — CR DG CHEST 1V PORT
1 series · 1 of 1 positions shown · non-contrast
Comparison: Chest radiograph 03/09/2013

CLINICAL DATA: Rule out aspiration

PORTABLE CHEST - 1 VIEW

[AP]
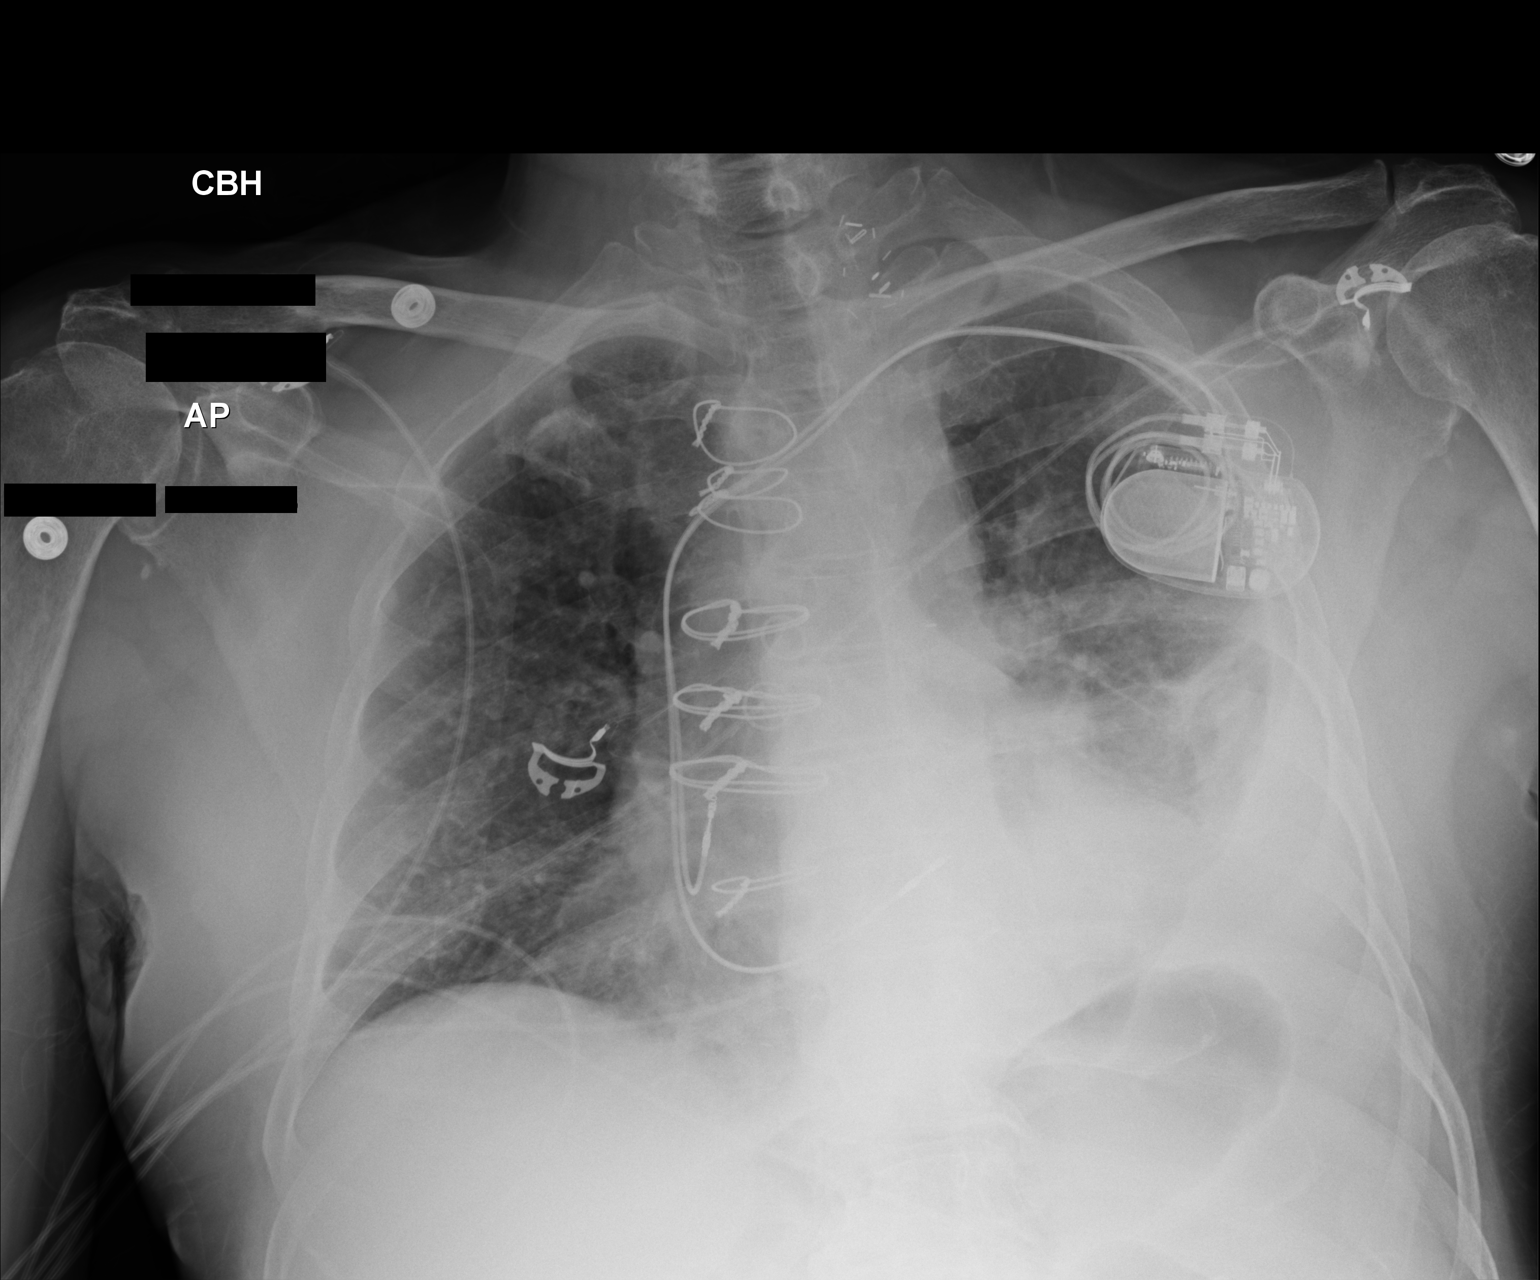

[1 of 1 positions shown; findings below may reference images not displayed]

FINDINGS: Left-sided pacemaker overlies normal cardiac silhouette.
There is a left pleural effusion not changed on paired to prior.
There is atelectasis at the left  lung base which also unchanged.
Right lung is relatively clear.  No pneumothorax
IMPRESSION: 1.  No significant change.
2.  Left lower lobe atelectasis and effusion.

## 2014-04-04 DIAGNOSIS — N390 Urinary tract infection, site not specified: Secondary | ICD-10-CM | POA: Diagnosis not present

## 2014-05-28 ENCOUNTER — Encounter: Payer: Self-pay | Admitting: Internal Medicine

## 2014-06-11 DIAGNOSIS — R279 Unspecified lack of coordination: Secondary | ICD-10-CM | POA: Diagnosis not present

## 2014-06-11 DIAGNOSIS — M6281 Muscle weakness (generalized): Secondary | ICD-10-CM | POA: Diagnosis not present

## 2014-06-11 DIAGNOSIS — I69959 Hemiplegia and hemiparesis following unspecified cerebrovascular disease affecting unspecified side: Secondary | ICD-10-CM | POA: Diagnosis not present

## 2014-06-11 DIAGNOSIS — R29818 Other symptoms and signs involving the nervous system: Secondary | ICD-10-CM | POA: Diagnosis not present

## 2014-06-14 DIAGNOSIS — R279 Unspecified lack of coordination: Secondary | ICD-10-CM | POA: Diagnosis not present

## 2014-06-14 DIAGNOSIS — I69959 Hemiplegia and hemiparesis following unspecified cerebrovascular disease affecting unspecified side: Secondary | ICD-10-CM | POA: Diagnosis not present

## 2014-06-14 DIAGNOSIS — R29818 Other symptoms and signs involving the nervous system: Secondary | ICD-10-CM | POA: Diagnosis not present

## 2014-06-14 DIAGNOSIS — M6281 Muscle weakness (generalized): Secondary | ICD-10-CM | POA: Diagnosis not present

## 2014-06-16 DIAGNOSIS — R29818 Other symptoms and signs involving the nervous system: Secondary | ICD-10-CM | POA: Diagnosis not present

## 2014-06-16 DIAGNOSIS — I69959 Hemiplegia and hemiparesis following unspecified cerebrovascular disease affecting unspecified side: Secondary | ICD-10-CM | POA: Diagnosis not present

## 2014-06-16 DIAGNOSIS — M6281 Muscle weakness (generalized): Secondary | ICD-10-CM | POA: Diagnosis not present

## 2014-06-16 DIAGNOSIS — R279 Unspecified lack of coordination: Secondary | ICD-10-CM | POA: Diagnosis not present

## 2014-06-16 DIAGNOSIS — R471 Dysarthria and anarthria: Secondary | ICD-10-CM | POA: Diagnosis not present

## 2014-06-17 DIAGNOSIS — I69959 Hemiplegia and hemiparesis following unspecified cerebrovascular disease affecting unspecified side: Secondary | ICD-10-CM | POA: Diagnosis not present

## 2014-06-17 DIAGNOSIS — R29818 Other symptoms and signs involving the nervous system: Secondary | ICD-10-CM | POA: Diagnosis not present

## 2014-06-17 DIAGNOSIS — R471 Dysarthria and anarthria: Secondary | ICD-10-CM | POA: Diagnosis not present

## 2014-06-17 DIAGNOSIS — R279 Unspecified lack of coordination: Secondary | ICD-10-CM | POA: Diagnosis not present

## 2014-06-17 DIAGNOSIS — M6281 Muscle weakness (generalized): Secondary | ICD-10-CM | POA: Diagnosis not present

## 2014-06-18 DIAGNOSIS — R279 Unspecified lack of coordination: Secondary | ICD-10-CM | POA: Diagnosis not present

## 2014-06-18 DIAGNOSIS — M6281 Muscle weakness (generalized): Secondary | ICD-10-CM | POA: Diagnosis not present

## 2014-06-18 DIAGNOSIS — I69959 Hemiplegia and hemiparesis following unspecified cerebrovascular disease affecting unspecified side: Secondary | ICD-10-CM | POA: Diagnosis not present

## 2014-06-18 DIAGNOSIS — R29818 Other symptoms and signs involving the nervous system: Secondary | ICD-10-CM | POA: Diagnosis not present

## 2014-06-18 DIAGNOSIS — R471 Dysarthria and anarthria: Secondary | ICD-10-CM | POA: Diagnosis not present

## 2014-06-21 DIAGNOSIS — I739 Peripheral vascular disease, unspecified: Secondary | ICD-10-CM | POA: Diagnosis not present

## 2014-06-21 DIAGNOSIS — B351 Tinea unguium: Secondary | ICD-10-CM | POA: Diagnosis not present

## 2014-06-21 DIAGNOSIS — E118 Type 2 diabetes mellitus with unspecified complications: Secondary | ICD-10-CM | POA: Diagnosis not present

## 2014-06-21 DIAGNOSIS — L84 Corns and callosities: Secondary | ICD-10-CM | POA: Diagnosis not present

## 2014-06-22 DIAGNOSIS — R279 Unspecified lack of coordination: Secondary | ICD-10-CM | POA: Diagnosis not present

## 2014-06-22 DIAGNOSIS — I69959 Hemiplegia and hemiparesis following unspecified cerebrovascular disease affecting unspecified side: Secondary | ICD-10-CM | POA: Diagnosis not present

## 2014-06-22 DIAGNOSIS — R29818 Other symptoms and signs involving the nervous system: Secondary | ICD-10-CM | POA: Diagnosis not present

## 2014-06-22 DIAGNOSIS — M6281 Muscle weakness (generalized): Secondary | ICD-10-CM | POA: Diagnosis not present

## 2014-06-22 DIAGNOSIS — R471 Dysarthria and anarthria: Secondary | ICD-10-CM | POA: Diagnosis not present

## 2014-06-23 DIAGNOSIS — R29818 Other symptoms and signs involving the nervous system: Secondary | ICD-10-CM | POA: Diagnosis not present

## 2014-06-23 DIAGNOSIS — M6281 Muscle weakness (generalized): Secondary | ICD-10-CM | POA: Diagnosis not present

## 2014-06-23 DIAGNOSIS — R279 Unspecified lack of coordination: Secondary | ICD-10-CM | POA: Diagnosis not present

## 2014-06-23 DIAGNOSIS — R471 Dysarthria and anarthria: Secondary | ICD-10-CM | POA: Diagnosis not present

## 2014-06-23 DIAGNOSIS — I69959 Hemiplegia and hemiparesis following unspecified cerebrovascular disease affecting unspecified side: Secondary | ICD-10-CM | POA: Diagnosis not present

## 2014-06-25 DIAGNOSIS — R279 Unspecified lack of coordination: Secondary | ICD-10-CM | POA: Diagnosis not present

## 2014-06-25 DIAGNOSIS — R471 Dysarthria and anarthria: Secondary | ICD-10-CM | POA: Diagnosis not present

## 2014-06-25 DIAGNOSIS — I69959 Hemiplegia and hemiparesis following unspecified cerebrovascular disease affecting unspecified side: Secondary | ICD-10-CM | POA: Diagnosis not present

## 2014-06-25 DIAGNOSIS — M6281 Muscle weakness (generalized): Secondary | ICD-10-CM | POA: Diagnosis not present

## 2014-06-25 DIAGNOSIS — R29818 Other symptoms and signs involving the nervous system: Secondary | ICD-10-CM | POA: Diagnosis not present

## 2014-06-28 DIAGNOSIS — R29818 Other symptoms and signs involving the nervous system: Secondary | ICD-10-CM | POA: Diagnosis not present

## 2014-06-28 DIAGNOSIS — R471 Dysarthria and anarthria: Secondary | ICD-10-CM | POA: Diagnosis not present

## 2014-06-28 DIAGNOSIS — R279 Unspecified lack of coordination: Secondary | ICD-10-CM | POA: Diagnosis not present

## 2014-06-28 DIAGNOSIS — I69959 Hemiplegia and hemiparesis following unspecified cerebrovascular disease affecting unspecified side: Secondary | ICD-10-CM | POA: Diagnosis not present

## 2014-06-28 DIAGNOSIS — M6281 Muscle weakness (generalized): Secondary | ICD-10-CM | POA: Diagnosis not present

## 2014-06-29 DIAGNOSIS — I69959 Hemiplegia and hemiparesis following unspecified cerebrovascular disease affecting unspecified side: Secondary | ICD-10-CM | POA: Diagnosis not present

## 2014-06-29 DIAGNOSIS — R471 Dysarthria and anarthria: Secondary | ICD-10-CM | POA: Diagnosis not present

## 2014-06-29 DIAGNOSIS — R279 Unspecified lack of coordination: Secondary | ICD-10-CM | POA: Diagnosis not present

## 2014-06-29 DIAGNOSIS — R29818 Other symptoms and signs involving the nervous system: Secondary | ICD-10-CM | POA: Diagnosis not present

## 2014-06-29 DIAGNOSIS — M6281 Muscle weakness (generalized): Secondary | ICD-10-CM | POA: Diagnosis not present

## 2014-06-30 DIAGNOSIS — R29818 Other symptoms and signs involving the nervous system: Secondary | ICD-10-CM | POA: Diagnosis not present

## 2014-06-30 DIAGNOSIS — I69959 Hemiplegia and hemiparesis following unspecified cerebrovascular disease affecting unspecified side: Secondary | ICD-10-CM | POA: Diagnosis not present

## 2014-06-30 DIAGNOSIS — M6281 Muscle weakness (generalized): Secondary | ICD-10-CM | POA: Diagnosis not present

## 2014-06-30 DIAGNOSIS — R471 Dysarthria and anarthria: Secondary | ICD-10-CM | POA: Diagnosis not present

## 2014-06-30 DIAGNOSIS — R279 Unspecified lack of coordination: Secondary | ICD-10-CM | POA: Diagnosis not present

## 2014-07-01 DIAGNOSIS — R29818 Other symptoms and signs involving the nervous system: Secondary | ICD-10-CM | POA: Diagnosis not present

## 2014-07-01 DIAGNOSIS — IMO0002 Reserved for concepts with insufficient information to code with codable children: Secondary | ICD-10-CM | POA: Diagnosis not present

## 2014-07-01 DIAGNOSIS — R7301 Impaired fasting glucose: Secondary | ICD-10-CM | POA: Diagnosis not present

## 2014-07-01 DIAGNOSIS — Z79899 Other long term (current) drug therapy: Secondary | ICD-10-CM | POA: Diagnosis not present

## 2014-07-01 DIAGNOSIS — R279 Unspecified lack of coordination: Secondary | ICD-10-CM | POA: Diagnosis not present

## 2014-07-01 DIAGNOSIS — I6350 Cerebral infarction due to unspecified occlusion or stenosis of unspecified cerebral artery: Secondary | ICD-10-CM | POA: Diagnosis not present

## 2014-07-01 DIAGNOSIS — I4891 Unspecified atrial fibrillation: Secondary | ICD-10-CM | POA: Diagnosis not present

## 2014-07-01 DIAGNOSIS — I69959 Hemiplegia and hemiparesis following unspecified cerebrovascular disease affecting unspecified side: Secondary | ICD-10-CM | POA: Diagnosis not present

## 2014-07-01 DIAGNOSIS — I2510 Atherosclerotic heart disease of native coronary artery without angina pectoris: Secondary | ICD-10-CM | POA: Diagnosis not present

## 2014-07-01 DIAGNOSIS — R471 Dysarthria and anarthria: Secondary | ICD-10-CM | POA: Diagnosis not present

## 2014-07-01 DIAGNOSIS — I10 Essential (primary) hypertension: Secondary | ICD-10-CM | POA: Diagnosis not present

## 2014-07-01 DIAGNOSIS — M6281 Muscle weakness (generalized): Secondary | ICD-10-CM | POA: Diagnosis not present

## 2014-07-01 DIAGNOSIS — Z23 Encounter for immunization: Secondary | ICD-10-CM | POA: Diagnosis not present

## 2014-07-02 DIAGNOSIS — R471 Dysarthria and anarthria: Secondary | ICD-10-CM | POA: Diagnosis not present

## 2014-07-02 DIAGNOSIS — R279 Unspecified lack of coordination: Secondary | ICD-10-CM | POA: Diagnosis not present

## 2014-07-02 DIAGNOSIS — I69959 Hemiplegia and hemiparesis following unspecified cerebrovascular disease affecting unspecified side: Secondary | ICD-10-CM | POA: Diagnosis not present

## 2014-07-02 DIAGNOSIS — M6281 Muscle weakness (generalized): Secondary | ICD-10-CM | POA: Diagnosis not present

## 2014-07-02 DIAGNOSIS — R29818 Other symptoms and signs involving the nervous system: Secondary | ICD-10-CM | POA: Diagnosis not present

## 2014-07-05 DIAGNOSIS — R29818 Other symptoms and signs involving the nervous system: Secondary | ICD-10-CM | POA: Diagnosis not present

## 2014-07-05 DIAGNOSIS — R279 Unspecified lack of coordination: Secondary | ICD-10-CM | POA: Diagnosis not present

## 2014-07-05 DIAGNOSIS — M6281 Muscle weakness (generalized): Secondary | ICD-10-CM | POA: Diagnosis not present

## 2014-07-05 DIAGNOSIS — I69959 Hemiplegia and hemiparesis following unspecified cerebrovascular disease affecting unspecified side: Secondary | ICD-10-CM | POA: Diagnosis not present

## 2014-07-05 DIAGNOSIS — R471 Dysarthria and anarthria: Secondary | ICD-10-CM | POA: Diagnosis not present

## 2014-07-06 DIAGNOSIS — I69959 Hemiplegia and hemiparesis following unspecified cerebrovascular disease affecting unspecified side: Secondary | ICD-10-CM | POA: Diagnosis not present

## 2014-07-06 DIAGNOSIS — R279 Unspecified lack of coordination: Secondary | ICD-10-CM | POA: Diagnosis not present

## 2014-07-06 DIAGNOSIS — R471 Dysarthria and anarthria: Secondary | ICD-10-CM | POA: Diagnosis not present

## 2014-07-06 DIAGNOSIS — M6281 Muscle weakness (generalized): Secondary | ICD-10-CM | POA: Diagnosis not present

## 2014-07-06 DIAGNOSIS — R29818 Other symptoms and signs involving the nervous system: Secondary | ICD-10-CM | POA: Diagnosis not present

## 2014-07-07 DIAGNOSIS — R29818 Other symptoms and signs involving the nervous system: Secondary | ICD-10-CM | POA: Diagnosis not present

## 2014-07-07 DIAGNOSIS — R471 Dysarthria and anarthria: Secondary | ICD-10-CM | POA: Diagnosis not present

## 2014-07-07 DIAGNOSIS — M6281 Muscle weakness (generalized): Secondary | ICD-10-CM | POA: Diagnosis not present

## 2014-07-07 DIAGNOSIS — R279 Unspecified lack of coordination: Secondary | ICD-10-CM | POA: Diagnosis not present

## 2014-07-07 DIAGNOSIS — I69959 Hemiplegia and hemiparesis following unspecified cerebrovascular disease affecting unspecified side: Secondary | ICD-10-CM | POA: Diagnosis not present

## 2014-07-08 DIAGNOSIS — R279 Unspecified lack of coordination: Secondary | ICD-10-CM | POA: Diagnosis not present

## 2014-07-08 DIAGNOSIS — I69959 Hemiplegia and hemiparesis following unspecified cerebrovascular disease affecting unspecified side: Secondary | ICD-10-CM | POA: Diagnosis not present

## 2014-07-08 DIAGNOSIS — M6281 Muscle weakness (generalized): Secondary | ICD-10-CM | POA: Diagnosis not present

## 2014-07-08 DIAGNOSIS — R471 Dysarthria and anarthria: Secondary | ICD-10-CM | POA: Diagnosis not present

## 2014-07-08 DIAGNOSIS — R29818 Other symptoms and signs involving the nervous system: Secondary | ICD-10-CM | POA: Diagnosis not present

## 2014-07-09 DIAGNOSIS — I69959 Hemiplegia and hemiparesis following unspecified cerebrovascular disease affecting unspecified side: Secondary | ICD-10-CM | POA: Diagnosis not present

## 2014-07-09 DIAGNOSIS — R279 Unspecified lack of coordination: Secondary | ICD-10-CM | POA: Diagnosis not present

## 2014-07-09 DIAGNOSIS — R471 Dysarthria and anarthria: Secondary | ICD-10-CM | POA: Diagnosis not present

## 2014-07-09 DIAGNOSIS — M6281 Muscle weakness (generalized): Secondary | ICD-10-CM | POA: Diagnosis not present

## 2014-07-09 DIAGNOSIS — R29818 Other symptoms and signs involving the nervous system: Secondary | ICD-10-CM | POA: Diagnosis not present

## 2014-07-13 DIAGNOSIS — I69959 Hemiplegia and hemiparesis following unspecified cerebrovascular disease affecting unspecified side: Secondary | ICD-10-CM | POA: Diagnosis not present

## 2014-07-13 DIAGNOSIS — R29818 Other symptoms and signs involving the nervous system: Secondary | ICD-10-CM | POA: Diagnosis not present

## 2014-07-13 DIAGNOSIS — R279 Unspecified lack of coordination: Secondary | ICD-10-CM | POA: Diagnosis not present

## 2014-07-13 DIAGNOSIS — R471 Dysarthria and anarthria: Secondary | ICD-10-CM | POA: Diagnosis not present

## 2014-07-13 DIAGNOSIS — M6281 Muscle weakness (generalized): Secondary | ICD-10-CM | POA: Diagnosis not present

## 2014-07-14 DIAGNOSIS — R279 Unspecified lack of coordination: Secondary | ICD-10-CM | POA: Diagnosis not present

## 2014-07-14 DIAGNOSIS — I69959 Hemiplegia and hemiparesis following unspecified cerebrovascular disease affecting unspecified side: Secondary | ICD-10-CM | POA: Diagnosis not present

## 2014-07-14 DIAGNOSIS — M6281 Muscle weakness (generalized): Secondary | ICD-10-CM | POA: Diagnosis not present

## 2014-07-14 DIAGNOSIS — R471 Dysarthria and anarthria: Secondary | ICD-10-CM | POA: Diagnosis not present

## 2014-07-14 DIAGNOSIS — R29818 Other symptoms and signs involving the nervous system: Secondary | ICD-10-CM | POA: Diagnosis not present

## 2014-07-16 DIAGNOSIS — R471 Dysarthria and anarthria: Secondary | ICD-10-CM | POA: Diagnosis not present

## 2014-07-16 DIAGNOSIS — I69951 Hemiplegia and hemiparesis following unspecified cerebrovascular disease affecting right dominant side: Secondary | ICD-10-CM | POA: Diagnosis not present

## 2014-07-16 DIAGNOSIS — M6281 Muscle weakness (generalized): Secondary | ICD-10-CM | POA: Diagnosis not present

## 2014-07-16 DIAGNOSIS — R278 Other lack of coordination: Secondary | ICD-10-CM | POA: Diagnosis not present

## 2014-07-19 DIAGNOSIS — I69951 Hemiplegia and hemiparesis following unspecified cerebrovascular disease affecting right dominant side: Secondary | ICD-10-CM | POA: Diagnosis not present

## 2014-07-19 DIAGNOSIS — R471 Dysarthria and anarthria: Secondary | ICD-10-CM | POA: Diagnosis not present

## 2014-07-19 DIAGNOSIS — R278 Other lack of coordination: Secondary | ICD-10-CM | POA: Diagnosis not present

## 2014-07-19 DIAGNOSIS — M6281 Muscle weakness (generalized): Secondary | ICD-10-CM | POA: Diagnosis not present

## 2014-07-20 DIAGNOSIS — R278 Other lack of coordination: Secondary | ICD-10-CM | POA: Diagnosis not present

## 2014-07-20 DIAGNOSIS — I69951 Hemiplegia and hemiparesis following unspecified cerebrovascular disease affecting right dominant side: Secondary | ICD-10-CM | POA: Diagnosis not present

## 2014-07-20 DIAGNOSIS — R471 Dysarthria and anarthria: Secondary | ICD-10-CM | POA: Diagnosis not present

## 2014-07-20 DIAGNOSIS — M6281 Muscle weakness (generalized): Secondary | ICD-10-CM | POA: Diagnosis not present

## 2014-07-21 DIAGNOSIS — R471 Dysarthria and anarthria: Secondary | ICD-10-CM | POA: Diagnosis not present

## 2014-07-21 DIAGNOSIS — M6281 Muscle weakness (generalized): Secondary | ICD-10-CM | POA: Diagnosis not present

## 2014-07-21 DIAGNOSIS — R278 Other lack of coordination: Secondary | ICD-10-CM | POA: Diagnosis not present

## 2014-07-21 DIAGNOSIS — I69951 Hemiplegia and hemiparesis following unspecified cerebrovascular disease affecting right dominant side: Secondary | ICD-10-CM | POA: Diagnosis not present

## 2014-07-22 DIAGNOSIS — I69951 Hemiplegia and hemiparesis following unspecified cerebrovascular disease affecting right dominant side: Secondary | ICD-10-CM | POA: Diagnosis not present

## 2014-07-22 DIAGNOSIS — R471 Dysarthria and anarthria: Secondary | ICD-10-CM | POA: Diagnosis not present

## 2014-07-22 DIAGNOSIS — M6281 Muscle weakness (generalized): Secondary | ICD-10-CM | POA: Diagnosis not present

## 2014-07-22 DIAGNOSIS — R278 Other lack of coordination: Secondary | ICD-10-CM | POA: Diagnosis not present

## 2014-07-28 DIAGNOSIS — R278 Other lack of coordination: Secondary | ICD-10-CM | POA: Diagnosis not present

## 2014-07-28 DIAGNOSIS — R471 Dysarthria and anarthria: Secondary | ICD-10-CM | POA: Diagnosis not present

## 2014-07-28 DIAGNOSIS — M6281 Muscle weakness (generalized): Secondary | ICD-10-CM | POA: Diagnosis not present

## 2014-07-28 DIAGNOSIS — I69951 Hemiplegia and hemiparesis following unspecified cerebrovascular disease affecting right dominant side: Secondary | ICD-10-CM | POA: Diagnosis not present

## 2014-08-09 DIAGNOSIS — R278 Other lack of coordination: Secondary | ICD-10-CM | POA: Diagnosis not present

## 2014-08-09 DIAGNOSIS — I69951 Hemiplegia and hemiparesis following unspecified cerebrovascular disease affecting right dominant side: Secondary | ICD-10-CM | POA: Diagnosis not present

## 2014-08-09 DIAGNOSIS — R471 Dysarthria and anarthria: Secondary | ICD-10-CM | POA: Diagnosis not present

## 2014-08-09 DIAGNOSIS — M6281 Muscle weakness (generalized): Secondary | ICD-10-CM | POA: Diagnosis not present

## 2014-08-18 DIAGNOSIS — R471 Dysarthria and anarthria: Secondary | ICD-10-CM | POA: Diagnosis not present

## 2014-08-20 DIAGNOSIS — R471 Dysarthria and anarthria: Secondary | ICD-10-CM | POA: Diagnosis not present

## 2014-08-23 DIAGNOSIS — M25519 Pain in unspecified shoulder: Secondary | ICD-10-CM | POA: Diagnosis not present

## 2014-08-23 DIAGNOSIS — R471 Dysarthria and anarthria: Secondary | ICD-10-CM | POA: Diagnosis not present

## 2014-08-24 DIAGNOSIS — R471 Dysarthria and anarthria: Secondary | ICD-10-CM | POA: Diagnosis not present

## 2014-08-26 DIAGNOSIS — M6281 Muscle weakness (generalized): Secondary | ICD-10-CM | POA: Diagnosis not present

## 2014-08-26 DIAGNOSIS — R471 Dysarthria and anarthria: Secondary | ICD-10-CM | POA: Diagnosis not present

## 2014-08-26 DIAGNOSIS — M19011 Primary osteoarthritis, right shoulder: Secondary | ICD-10-CM | POA: Diagnosis not present

## 2014-08-26 DIAGNOSIS — M25511 Pain in right shoulder: Secondary | ICD-10-CM | POA: Diagnosis not present

## 2014-08-30 DIAGNOSIS — R471 Dysarthria and anarthria: Secondary | ICD-10-CM | POA: Diagnosis not present

## 2014-08-31 DIAGNOSIS — R471 Dysarthria and anarthria: Secondary | ICD-10-CM | POA: Diagnosis not present

## 2014-09-03 DIAGNOSIS — R471 Dysarthria and anarthria: Secondary | ICD-10-CM | POA: Diagnosis not present

## 2014-09-21 DIAGNOSIS — E1140 Type 2 diabetes mellitus with diabetic neuropathy, unspecified: Secondary | ICD-10-CM | POA: Diagnosis not present

## 2014-09-21 DIAGNOSIS — I739 Peripheral vascular disease, unspecified: Secondary | ICD-10-CM | POA: Diagnosis not present

## 2014-09-21 DIAGNOSIS — L84 Corns and callosities: Secondary | ICD-10-CM | POA: Diagnosis not present

## 2014-09-21 DIAGNOSIS — B351 Tinea unguium: Secondary | ICD-10-CM | POA: Diagnosis not present

## 2014-11-23 ENCOUNTER — Telehealth: Payer: Self-pay | Admitting: Internal Medicine

## 2014-11-23 NOTE — Telephone Encounter (Signed)
2/9 left vm to sched OV w/ klein; ah

## 2014-12-07 DIAGNOSIS — M792 Neuralgia and neuritis, unspecified: Secondary | ICD-10-CM | POA: Diagnosis not present

## 2014-12-07 DIAGNOSIS — M71571 Other bursitis, not elsewhere classified, right ankle and foot: Secondary | ICD-10-CM | POA: Diagnosis not present

## 2014-12-07 DIAGNOSIS — M2040 Other hammer toe(s) (acquired), unspecified foot: Secondary | ICD-10-CM | POA: Diagnosis not present

## 2014-12-07 DIAGNOSIS — M79609 Pain in unspecified limb: Secondary | ICD-10-CM | POA: Diagnosis not present

## 2014-12-10 DIAGNOSIS — E1140 Type 2 diabetes mellitus with diabetic neuropathy, unspecified: Secondary | ICD-10-CM | POA: Diagnosis not present

## 2014-12-10 DIAGNOSIS — L84 Corns and callosities: Secondary | ICD-10-CM | POA: Diagnosis not present

## 2014-12-10 DIAGNOSIS — I739 Peripheral vascular disease, unspecified: Secondary | ICD-10-CM | POA: Diagnosis not present

## 2014-12-10 DIAGNOSIS — L602 Onychogryphosis: Secondary | ICD-10-CM | POA: Diagnosis not present

## 2014-12-28 ENCOUNTER — Encounter: Payer: Self-pay | Admitting: *Deleted

## 2015-02-03 DIAGNOSIS — M6281 Muscle weakness (generalized): Secondary | ICD-10-CM | POA: Diagnosis not present

## 2015-02-03 DIAGNOSIS — I69122 Dysarthria following nontraumatic intracerebral hemorrhage: Secondary | ICD-10-CM | POA: Diagnosis not present

## 2015-02-03 DIAGNOSIS — I69322 Dysarthria following cerebral infarction: Secondary | ICD-10-CM | POA: Diagnosis not present

## 2015-02-04 DIAGNOSIS — M6281 Muscle weakness (generalized): Secondary | ICD-10-CM | POA: Diagnosis not present

## 2015-02-04 DIAGNOSIS — I69322 Dysarthria following cerebral infarction: Secondary | ICD-10-CM | POA: Diagnosis not present

## 2015-02-04 DIAGNOSIS — I69122 Dysarthria following nontraumatic intracerebral hemorrhage: Secondary | ICD-10-CM | POA: Diagnosis not present

## 2015-02-07 DIAGNOSIS — I69122 Dysarthria following nontraumatic intracerebral hemorrhage: Secondary | ICD-10-CM | POA: Diagnosis not present

## 2015-02-07 DIAGNOSIS — M6281 Muscle weakness (generalized): Secondary | ICD-10-CM | POA: Diagnosis not present

## 2015-02-07 DIAGNOSIS — I69322 Dysarthria following cerebral infarction: Secondary | ICD-10-CM | POA: Diagnosis not present

## 2015-02-09 DIAGNOSIS — I69122 Dysarthria following nontraumatic intracerebral hemorrhage: Secondary | ICD-10-CM | POA: Diagnosis not present

## 2015-02-09 DIAGNOSIS — I69322 Dysarthria following cerebral infarction: Secondary | ICD-10-CM | POA: Diagnosis not present

## 2015-02-09 DIAGNOSIS — M6281 Muscle weakness (generalized): Secondary | ICD-10-CM | POA: Diagnosis not present

## 2015-02-10 DIAGNOSIS — I69122 Dysarthria following nontraumatic intracerebral hemorrhage: Secondary | ICD-10-CM | POA: Diagnosis not present

## 2015-02-10 DIAGNOSIS — I69322 Dysarthria following cerebral infarction: Secondary | ICD-10-CM | POA: Diagnosis not present

## 2015-02-10 DIAGNOSIS — M6281 Muscle weakness (generalized): Secondary | ICD-10-CM | POA: Diagnosis not present

## 2015-02-11 DIAGNOSIS — M6281 Muscle weakness (generalized): Secondary | ICD-10-CM | POA: Diagnosis not present

## 2015-02-11 DIAGNOSIS — I69122 Dysarthria following nontraumatic intracerebral hemorrhage: Secondary | ICD-10-CM | POA: Diagnosis not present

## 2015-02-11 DIAGNOSIS — I69322 Dysarthria following cerebral infarction: Secondary | ICD-10-CM | POA: Diagnosis not present

## 2015-02-15 DIAGNOSIS — I69122 Dysarthria following nontraumatic intracerebral hemorrhage: Secondary | ICD-10-CM | POA: Diagnosis not present

## 2015-02-15 DIAGNOSIS — I69322 Dysarthria following cerebral infarction: Secondary | ICD-10-CM | POA: Diagnosis not present

## 2015-02-15 DIAGNOSIS — M6281 Muscle weakness (generalized): Secondary | ICD-10-CM | POA: Diagnosis not present

## 2015-02-16 DIAGNOSIS — I69122 Dysarthria following nontraumatic intracerebral hemorrhage: Secondary | ICD-10-CM | POA: Diagnosis not present

## 2015-02-16 DIAGNOSIS — M6281 Muscle weakness (generalized): Secondary | ICD-10-CM | POA: Diagnosis not present

## 2015-02-16 DIAGNOSIS — I69322 Dysarthria following cerebral infarction: Secondary | ICD-10-CM | POA: Diagnosis not present

## 2015-02-18 DIAGNOSIS — I69322 Dysarthria following cerebral infarction: Secondary | ICD-10-CM | POA: Diagnosis not present

## 2015-02-18 DIAGNOSIS — I69122 Dysarthria following nontraumatic intracerebral hemorrhage: Secondary | ICD-10-CM | POA: Diagnosis not present

## 2015-02-18 DIAGNOSIS — M6281 Muscle weakness (generalized): Secondary | ICD-10-CM | POA: Diagnosis not present

## 2015-02-24 ENCOUNTER — Ambulatory Visit (INDEPENDENT_AMBULATORY_CARE_PROVIDER_SITE_OTHER): Payer: Medicare Other | Admitting: *Deleted

## 2015-02-24 DIAGNOSIS — Z950 Presence of cardiac pacemaker: Secondary | ICD-10-CM | POA: Diagnosis not present

## 2015-02-24 DIAGNOSIS — I442 Atrioventricular block, complete: Secondary | ICD-10-CM | POA: Diagnosis not present

## 2015-02-24 DIAGNOSIS — I4892 Unspecified atrial flutter: Secondary | ICD-10-CM | POA: Diagnosis not present

## 2015-02-24 LAB — CUP PACEART INCLINIC DEVICE CHECK
Battery Voltage: 2.89 V
Brady Statistic RA Percent Paced: 1.7 %
Brady Statistic RV Percent Paced: 96 %
Date Time Interrogation Session: 20160512120332
Lead Channel Impedance Value: 400 Ohm
Lead Channel Sensing Intrinsic Amplitude: 4.3 mV
Lead Channel Setting Pacing Amplitude: 2 V
Lead Channel Setting Pacing Pulse Width: 0.4 ms
MDC IDC MSMT BATTERY REMAINING LONGEVITY: 78 mo
MDC IDC MSMT LEADCHNL RV IMPEDANCE VALUE: 537.5 Ohm
MDC IDC MSMT LEADCHNL RV PACING THRESHOLD AMPLITUDE: 0.75 V
MDC IDC MSMT LEADCHNL RV PACING THRESHOLD PULSEWIDTH: 0.4 ms
MDC IDC MSMT LEADCHNL RV SENSING INTR AMPL: 10.1 mV
MDC IDC SET LEADCHNL RV PACING AMPLITUDE: 1 V
MDC IDC SET LEADCHNL RV SENSING SENSITIVITY: 2 mV
Pulse Gen Serial Number: 2317066

## 2015-02-24 NOTE — Patient Instructions (Signed)
Due to see Dr. Virl Axe in 6 months.

## 2015-02-24 NOTE — Progress Notes (Signed)
Pacemaker check in clinic. Normal device function. Thresholds, sensing, impedances consistent with previous measurements. Device programmed to maximize longevity. 70% mode switch + pradaxa. No high ventricular rates noted. Device programmed at appropriate safety margins. Histogram distribution appropriate for patient activity level. Device programmed to optimize intrinsic conduction. Estimated longevity 5.9-6.60yrs. ROV w/ SK in 93mo.

## 2015-03-08 DIAGNOSIS — L739 Follicular disorder, unspecified: Secondary | ICD-10-CM | POA: Diagnosis not present

## 2015-03-08 DIAGNOSIS — B351 Tinea unguium: Secondary | ICD-10-CM | POA: Diagnosis not present

## 2015-03-08 DIAGNOSIS — L84 Corns and callosities: Secondary | ICD-10-CM | POA: Diagnosis not present

## 2015-03-22 DIAGNOSIS — I679 Cerebrovascular disease, unspecified: Secondary | ICD-10-CM | POA: Diagnosis not present

## 2015-03-22 DIAGNOSIS — C801 Malignant (primary) neoplasm, unspecified: Secondary | ICD-10-CM | POA: Diagnosis not present

## 2015-03-22 DIAGNOSIS — I519 Heart disease, unspecified: Secondary | ICD-10-CM | POA: Diagnosis not present

## 2015-03-22 DIAGNOSIS — G459 Transient cerebral ischemic attack, unspecified: Secondary | ICD-10-CM | POA: Diagnosis not present

## 2015-03-25 DIAGNOSIS — I679 Cerebrovascular disease, unspecified: Secondary | ICD-10-CM | POA: Diagnosis not present

## 2015-03-29 DIAGNOSIS — I679 Cerebrovascular disease, unspecified: Secondary | ICD-10-CM | POA: Diagnosis not present

## 2015-04-01 ENCOUNTER — Encounter: Payer: Self-pay | Admitting: Internal Medicine

## 2015-04-01 DIAGNOSIS — I679 Cerebrovascular disease, unspecified: Secondary | ICD-10-CM | POA: Diagnosis not present

## 2015-04-05 DIAGNOSIS — I679 Cerebrovascular disease, unspecified: Secondary | ICD-10-CM | POA: Diagnosis not present

## 2015-04-08 DIAGNOSIS — I679 Cerebrovascular disease, unspecified: Secondary | ICD-10-CM | POA: Diagnosis not present

## 2015-04-11 DIAGNOSIS — I679 Cerebrovascular disease, unspecified: Secondary | ICD-10-CM | POA: Diagnosis not present

## 2015-04-12 DIAGNOSIS — I679 Cerebrovascular disease, unspecified: Secondary | ICD-10-CM | POA: Diagnosis not present

## 2015-04-15 DIAGNOSIS — I679 Cerebrovascular disease, unspecified: Secondary | ICD-10-CM | POA: Diagnosis not present

## 2015-04-21 DIAGNOSIS — I679 Cerebrovascular disease, unspecified: Secondary | ICD-10-CM | POA: Diagnosis not present

## 2015-04-25 DIAGNOSIS — I679 Cerebrovascular disease, unspecified: Secondary | ICD-10-CM | POA: Diagnosis not present

## 2015-04-29 DIAGNOSIS — N390 Urinary tract infection, site not specified: Secondary | ICD-10-CM | POA: Diagnosis not present

## 2015-04-29 DIAGNOSIS — J209 Acute bronchitis, unspecified: Secondary | ICD-10-CM | POA: Diagnosis not present

## 2015-04-29 DIAGNOSIS — R05 Cough: Secondary | ICD-10-CM | POA: Diagnosis not present

## 2015-04-29 DIAGNOSIS — B354 Tinea corporis: Secondary | ICD-10-CM | POA: Diagnosis not present

## 2015-05-02 DIAGNOSIS — I679 Cerebrovascular disease, unspecified: Secondary | ICD-10-CM | POA: Diagnosis not present

## 2015-05-09 DIAGNOSIS — I679 Cerebrovascular disease, unspecified: Secondary | ICD-10-CM | POA: Diagnosis not present

## 2015-05-16 DIAGNOSIS — I679 Cerebrovascular disease, unspecified: Secondary | ICD-10-CM | POA: Diagnosis not present

## 2015-05-19 DIAGNOSIS — I679 Cerebrovascular disease, unspecified: Secondary | ICD-10-CM | POA: Diagnosis not present

## 2015-05-21 DIAGNOSIS — I679 Cerebrovascular disease, unspecified: Secondary | ICD-10-CM | POA: Diagnosis not present

## 2015-05-21 DIAGNOSIS — N390 Urinary tract infection, site not specified: Secondary | ICD-10-CM | POA: Diagnosis not present

## 2015-05-23 DIAGNOSIS — I679 Cerebrovascular disease, unspecified: Secondary | ICD-10-CM | POA: Diagnosis not present

## 2015-05-26 DIAGNOSIS — I679 Cerebrovascular disease, unspecified: Secondary | ICD-10-CM | POA: Diagnosis not present

## 2015-05-27 DIAGNOSIS — I679 Cerebrovascular disease, unspecified: Secondary | ICD-10-CM | POA: Diagnosis not present

## 2015-05-30 DIAGNOSIS — I679 Cerebrovascular disease, unspecified: Secondary | ICD-10-CM | POA: Diagnosis not present

## 2015-06-06 DIAGNOSIS — I679 Cerebrovascular disease, unspecified: Secondary | ICD-10-CM | POA: Diagnosis not present

## 2015-06-07 DIAGNOSIS — L84 Corns and callosities: Secondary | ICD-10-CM | POA: Diagnosis not present

## 2015-06-07 DIAGNOSIS — B351 Tinea unguium: Secondary | ICD-10-CM | POA: Diagnosis not present

## 2015-06-07 DIAGNOSIS — L739 Follicular disorder, unspecified: Secondary | ICD-10-CM | POA: Diagnosis not present

## 2015-06-10 DIAGNOSIS — I679 Cerebrovascular disease, unspecified: Secondary | ICD-10-CM | POA: Diagnosis not present

## 2015-06-15 DIAGNOSIS — I679 Cerebrovascular disease, unspecified: Secondary | ICD-10-CM | POA: Diagnosis not present

## 2015-06-16 DIAGNOSIS — I679 Cerebrovascular disease, unspecified: Secondary | ICD-10-CM | POA: Diagnosis not present

## 2015-06-22 DIAGNOSIS — I679 Cerebrovascular disease, unspecified: Secondary | ICD-10-CM | POA: Diagnosis not present

## 2015-06-23 DIAGNOSIS — I679 Cerebrovascular disease, unspecified: Secondary | ICD-10-CM | POA: Diagnosis not present

## 2015-06-24 DIAGNOSIS — I679 Cerebrovascular disease, unspecified: Secondary | ICD-10-CM | POA: Diagnosis not present

## 2015-06-30 DIAGNOSIS — I739 Peripheral vascular disease, unspecified: Secondary | ICD-10-CM | POA: Diagnosis not present

## 2015-06-30 DIAGNOSIS — Z1389 Encounter for screening for other disorder: Secondary | ICD-10-CM | POA: Diagnosis not present

## 2015-06-30 DIAGNOSIS — I480 Paroxysmal atrial fibrillation: Secondary | ICD-10-CM | POA: Diagnosis not present

## 2015-06-30 DIAGNOSIS — G8190 Hemiplegia, unspecified affecting unspecified side: Secondary | ICD-10-CM | POA: Diagnosis not present

## 2015-06-30 DIAGNOSIS — I639 Cerebral infarction, unspecified: Secondary | ICD-10-CM | POA: Diagnosis not present

## 2015-06-30 DIAGNOSIS — D472 Monoclonal gammopathy: Secondary | ICD-10-CM | POA: Diagnosis not present

## 2015-06-30 DIAGNOSIS — Z23 Encounter for immunization: Secondary | ICD-10-CM | POA: Diagnosis not present

## 2015-06-30 DIAGNOSIS — Z6822 Body mass index (BMI) 22.0-22.9, adult: Secondary | ICD-10-CM | POA: Diagnosis not present

## 2015-06-30 DIAGNOSIS — I10 Essential (primary) hypertension: Secondary | ICD-10-CM | POA: Diagnosis not present

## 2015-06-30 DIAGNOSIS — R7301 Impaired fasting glucose: Secondary | ICD-10-CM | POA: Diagnosis not present

## 2015-06-30 DIAGNOSIS — E559 Vitamin D deficiency, unspecified: Secondary | ICD-10-CM | POA: Diagnosis not present

## 2015-06-30 DIAGNOSIS — E785 Hyperlipidemia, unspecified: Secondary | ICD-10-CM | POA: Diagnosis not present

## 2015-07-01 DIAGNOSIS — I679 Cerebrovascular disease, unspecified: Secondary | ICD-10-CM | POA: Diagnosis not present

## 2015-07-07 DIAGNOSIS — I679 Cerebrovascular disease, unspecified: Secondary | ICD-10-CM | POA: Diagnosis not present

## 2015-07-11 DIAGNOSIS — I679 Cerebrovascular disease, unspecified: Secondary | ICD-10-CM | POA: Diagnosis not present

## 2015-07-16 DIAGNOSIS — I679 Cerebrovascular disease, unspecified: Secondary | ICD-10-CM | POA: Diagnosis not present

## 2015-07-22 DIAGNOSIS — I679 Cerebrovascular disease, unspecified: Secondary | ICD-10-CM | POA: Diagnosis not present

## 2015-07-26 DIAGNOSIS — I679 Cerebrovascular disease, unspecified: Secondary | ICD-10-CM | POA: Diagnosis not present

## 2015-07-27 DIAGNOSIS — I679 Cerebrovascular disease, unspecified: Secondary | ICD-10-CM | POA: Diagnosis not present

## 2015-07-28 DIAGNOSIS — I679 Cerebrovascular disease, unspecified: Secondary | ICD-10-CM | POA: Diagnosis not present

## 2015-08-01 DIAGNOSIS — I679 Cerebrovascular disease, unspecified: Secondary | ICD-10-CM | POA: Diagnosis not present

## 2015-08-06 DIAGNOSIS — Z23 Encounter for immunization: Secondary | ICD-10-CM | POA: Diagnosis not present

## 2015-08-08 DIAGNOSIS — I679 Cerebrovascular disease, unspecified: Secondary | ICD-10-CM | POA: Diagnosis not present

## 2015-08-15 DIAGNOSIS — I679 Cerebrovascular disease, unspecified: Secondary | ICD-10-CM | POA: Diagnosis not present

## 2015-08-16 DIAGNOSIS — I679 Cerebrovascular disease, unspecified: Secondary | ICD-10-CM | POA: Diagnosis not present

## 2015-08-19 DIAGNOSIS — I679 Cerebrovascular disease, unspecified: Secondary | ICD-10-CM | POA: Diagnosis not present

## 2015-08-22 DIAGNOSIS — I679 Cerebrovascular disease, unspecified: Secondary | ICD-10-CM | POA: Diagnosis not present

## 2015-08-23 DIAGNOSIS — I679 Cerebrovascular disease, unspecified: Secondary | ICD-10-CM | POA: Diagnosis not present

## 2015-09-01 DIAGNOSIS — I679 Cerebrovascular disease, unspecified: Secondary | ICD-10-CM | POA: Diagnosis not present

## 2015-09-05 DIAGNOSIS — I679 Cerebrovascular disease, unspecified: Secondary | ICD-10-CM | POA: Diagnosis not present

## 2015-09-14 DIAGNOSIS — I679 Cerebrovascular disease, unspecified: Secondary | ICD-10-CM | POA: Diagnosis not present

## 2015-09-15 DIAGNOSIS — I679 Cerebrovascular disease, unspecified: Secondary | ICD-10-CM | POA: Diagnosis not present

## 2015-09-19 DIAGNOSIS — I679 Cerebrovascular disease, unspecified: Secondary | ICD-10-CM | POA: Diagnosis not present

## 2015-09-22 DIAGNOSIS — I679 Cerebrovascular disease, unspecified: Secondary | ICD-10-CM | POA: Diagnosis not present

## 2015-09-26 DIAGNOSIS — I679 Cerebrovascular disease, unspecified: Secondary | ICD-10-CM | POA: Diagnosis not present

## 2015-09-27 DIAGNOSIS — L84 Corns and callosities: Secondary | ICD-10-CM | POA: Diagnosis not present

## 2015-09-27 DIAGNOSIS — B351 Tinea unguium: Secondary | ICD-10-CM | POA: Diagnosis not present

## 2015-09-27 DIAGNOSIS — L739 Follicular disorder, unspecified: Secondary | ICD-10-CM | POA: Diagnosis not present

## 2015-09-27 DIAGNOSIS — E1140 Type 2 diabetes mellitus with diabetic neuropathy, unspecified: Secondary | ICD-10-CM | POA: Diagnosis not present

## 2015-09-30 DIAGNOSIS — I679 Cerebrovascular disease, unspecified: Secondary | ICD-10-CM | POA: Diagnosis not present

## 2015-10-05 DIAGNOSIS — I679 Cerebrovascular disease, unspecified: Secondary | ICD-10-CM | POA: Diagnosis not present

## 2015-10-07 DIAGNOSIS — I679 Cerebrovascular disease, unspecified: Secondary | ICD-10-CM | POA: Diagnosis not present

## 2015-10-11 ENCOUNTER — Encounter: Payer: Self-pay | Admitting: *Deleted

## 2015-10-11 DIAGNOSIS — I679 Cerebrovascular disease, unspecified: Secondary | ICD-10-CM | POA: Diagnosis not present

## 2015-10-13 DIAGNOSIS — I679 Cerebrovascular disease, unspecified: Secondary | ICD-10-CM | POA: Diagnosis not present

## 2015-10-16 DIAGNOSIS — I679 Cerebrovascular disease, unspecified: Secondary | ICD-10-CM | POA: Diagnosis not present

## 2015-10-18 DIAGNOSIS — I679 Cerebrovascular disease, unspecified: Secondary | ICD-10-CM | POA: Diagnosis not present

## 2015-10-20 DIAGNOSIS — I679 Cerebrovascular disease, unspecified: Secondary | ICD-10-CM | POA: Diagnosis not present

## 2015-10-21 DIAGNOSIS — I679 Cerebrovascular disease, unspecified: Secondary | ICD-10-CM | POA: Diagnosis not present

## 2015-10-27 DIAGNOSIS — R296 Repeated falls: Secondary | ICD-10-CM | POA: Diagnosis not present

## 2015-10-27 DIAGNOSIS — R293 Abnormal posture: Secondary | ICD-10-CM | POA: Diagnosis not present

## 2015-10-27 DIAGNOSIS — M6281 Muscle weakness (generalized): Secondary | ICD-10-CM | POA: Diagnosis not present

## 2015-10-27 DIAGNOSIS — R2681 Unsteadiness on feet: Secondary | ICD-10-CM | POA: Diagnosis not present

## 2015-10-28 DIAGNOSIS — R296 Repeated falls: Secondary | ICD-10-CM | POA: Diagnosis not present

## 2015-10-28 DIAGNOSIS — I679 Cerebrovascular disease, unspecified: Secondary | ICD-10-CM | POA: Diagnosis not present

## 2015-10-28 DIAGNOSIS — R293 Abnormal posture: Secondary | ICD-10-CM | POA: Diagnosis not present

## 2015-10-28 DIAGNOSIS — M6281 Muscle weakness (generalized): Secondary | ICD-10-CM | POA: Diagnosis not present

## 2015-10-28 DIAGNOSIS — R2681 Unsteadiness on feet: Secondary | ICD-10-CM | POA: Diagnosis not present

## 2015-10-31 DIAGNOSIS — R296 Repeated falls: Secondary | ICD-10-CM | POA: Diagnosis not present

## 2015-10-31 DIAGNOSIS — M6281 Muscle weakness (generalized): Secondary | ICD-10-CM | POA: Diagnosis not present

## 2015-10-31 DIAGNOSIS — R293 Abnormal posture: Secondary | ICD-10-CM | POA: Diagnosis not present

## 2015-10-31 DIAGNOSIS — R2681 Unsteadiness on feet: Secondary | ICD-10-CM | POA: Diagnosis not present

## 2015-11-01 DIAGNOSIS — R293 Abnormal posture: Secondary | ICD-10-CM | POA: Diagnosis not present

## 2015-11-01 DIAGNOSIS — M6281 Muscle weakness (generalized): Secondary | ICD-10-CM | POA: Diagnosis not present

## 2015-11-01 DIAGNOSIS — R296 Repeated falls: Secondary | ICD-10-CM | POA: Diagnosis not present

## 2015-11-01 DIAGNOSIS — R2681 Unsteadiness on feet: Secondary | ICD-10-CM | POA: Diagnosis not present

## 2015-11-02 DIAGNOSIS — R2681 Unsteadiness on feet: Secondary | ICD-10-CM | POA: Diagnosis not present

## 2015-11-02 DIAGNOSIS — R293 Abnormal posture: Secondary | ICD-10-CM | POA: Diagnosis not present

## 2015-11-02 DIAGNOSIS — M6281 Muscle weakness (generalized): Secondary | ICD-10-CM | POA: Diagnosis not present

## 2015-11-02 DIAGNOSIS — I679 Cerebrovascular disease, unspecified: Secondary | ICD-10-CM | POA: Diagnosis not present

## 2015-11-02 DIAGNOSIS — R296 Repeated falls: Secondary | ICD-10-CM | POA: Diagnosis not present

## 2015-11-04 DIAGNOSIS — R293 Abnormal posture: Secondary | ICD-10-CM | POA: Diagnosis not present

## 2015-11-04 DIAGNOSIS — I679 Cerebrovascular disease, unspecified: Secondary | ICD-10-CM | POA: Diagnosis not present

## 2015-11-04 DIAGNOSIS — R296 Repeated falls: Secondary | ICD-10-CM | POA: Diagnosis not present

## 2015-11-04 DIAGNOSIS — M6281 Muscle weakness (generalized): Secondary | ICD-10-CM | POA: Diagnosis not present

## 2015-11-04 DIAGNOSIS — R2681 Unsteadiness on feet: Secondary | ICD-10-CM | POA: Diagnosis not present

## 2015-11-07 DIAGNOSIS — R2681 Unsteadiness on feet: Secondary | ICD-10-CM | POA: Diagnosis not present

## 2015-11-07 DIAGNOSIS — R296 Repeated falls: Secondary | ICD-10-CM | POA: Diagnosis not present

## 2015-11-07 DIAGNOSIS — M6281 Muscle weakness (generalized): Secondary | ICD-10-CM | POA: Diagnosis not present

## 2015-11-07 DIAGNOSIS — R293 Abnormal posture: Secondary | ICD-10-CM | POA: Diagnosis not present

## 2015-11-08 DIAGNOSIS — R2681 Unsteadiness on feet: Secondary | ICD-10-CM | POA: Diagnosis not present

## 2015-11-08 DIAGNOSIS — R296 Repeated falls: Secondary | ICD-10-CM | POA: Diagnosis not present

## 2015-11-08 DIAGNOSIS — M6281 Muscle weakness (generalized): Secondary | ICD-10-CM | POA: Diagnosis not present

## 2015-11-08 DIAGNOSIS — R293 Abnormal posture: Secondary | ICD-10-CM | POA: Diagnosis not present

## 2015-11-09 DIAGNOSIS — R293 Abnormal posture: Secondary | ICD-10-CM | POA: Diagnosis not present

## 2015-11-09 DIAGNOSIS — R296 Repeated falls: Secondary | ICD-10-CM | POA: Diagnosis not present

## 2015-11-09 DIAGNOSIS — M6281 Muscle weakness (generalized): Secondary | ICD-10-CM | POA: Diagnosis not present

## 2015-11-09 DIAGNOSIS — R2681 Unsteadiness on feet: Secondary | ICD-10-CM | POA: Diagnosis not present

## 2015-11-10 DIAGNOSIS — R296 Repeated falls: Secondary | ICD-10-CM | POA: Diagnosis not present

## 2015-11-10 DIAGNOSIS — R2681 Unsteadiness on feet: Secondary | ICD-10-CM | POA: Diagnosis not present

## 2015-11-10 DIAGNOSIS — R293 Abnormal posture: Secondary | ICD-10-CM | POA: Diagnosis not present

## 2015-11-10 DIAGNOSIS — M6281 Muscle weakness (generalized): Secondary | ICD-10-CM | POA: Diagnosis not present

## 2015-11-11 DIAGNOSIS — R296 Repeated falls: Secondary | ICD-10-CM | POA: Diagnosis not present

## 2015-11-11 DIAGNOSIS — M6281 Muscle weakness (generalized): Secondary | ICD-10-CM | POA: Diagnosis not present

## 2015-11-11 DIAGNOSIS — R2681 Unsteadiness on feet: Secondary | ICD-10-CM | POA: Diagnosis not present

## 2015-11-11 DIAGNOSIS — R293 Abnormal posture: Secondary | ICD-10-CM | POA: Diagnosis not present

## 2015-11-15 DIAGNOSIS — R2681 Unsteadiness on feet: Secondary | ICD-10-CM | POA: Diagnosis not present

## 2015-11-15 DIAGNOSIS — R296 Repeated falls: Secondary | ICD-10-CM | POA: Diagnosis not present

## 2015-11-15 DIAGNOSIS — R293 Abnormal posture: Secondary | ICD-10-CM | POA: Diagnosis not present

## 2015-11-15 DIAGNOSIS — M6281 Muscle weakness (generalized): Secondary | ICD-10-CM | POA: Diagnosis not present

## 2015-11-16 ENCOUNTER — Encounter: Payer: Self-pay | Admitting: *Deleted

## 2015-11-16 DIAGNOSIS — M6281 Muscle weakness (generalized): Secondary | ICD-10-CM | POA: Diagnosis not present

## 2015-11-16 DIAGNOSIS — R2681 Unsteadiness on feet: Secondary | ICD-10-CM | POA: Diagnosis not present

## 2015-11-16 DIAGNOSIS — R296 Repeated falls: Secondary | ICD-10-CM | POA: Diagnosis not present

## 2015-11-16 DIAGNOSIS — R293 Abnormal posture: Secondary | ICD-10-CM | POA: Diagnosis not present

## 2015-11-17 DIAGNOSIS — R293 Abnormal posture: Secondary | ICD-10-CM | POA: Diagnosis not present

## 2015-11-17 DIAGNOSIS — R296 Repeated falls: Secondary | ICD-10-CM | POA: Diagnosis not present

## 2015-11-17 DIAGNOSIS — R2681 Unsteadiness on feet: Secondary | ICD-10-CM | POA: Diagnosis not present

## 2015-11-17 DIAGNOSIS — M6281 Muscle weakness (generalized): Secondary | ICD-10-CM | POA: Diagnosis not present

## 2015-12-13 DIAGNOSIS — M792 Neuralgia and neuritis, unspecified: Secondary | ICD-10-CM | POA: Diagnosis not present

## 2015-12-13 DIAGNOSIS — M2041 Other hammer toe(s) (acquired), right foot: Secondary | ICD-10-CM | POA: Diagnosis not present

## 2015-12-13 DIAGNOSIS — M71571 Other bursitis, not elsewhere classified, right ankle and foot: Secondary | ICD-10-CM | POA: Diagnosis not present

## 2015-12-14 ENCOUNTER — Encounter: Payer: Self-pay | Admitting: *Deleted

## 2016-04-04 DIAGNOSIS — Z9181 History of falling: Secondary | ICD-10-CM | POA: Diagnosis not present

## 2016-04-04 DIAGNOSIS — I69351 Hemiplegia and hemiparesis following cerebral infarction affecting right dominant side: Secondary | ICD-10-CM | POA: Diagnosis not present

## 2016-04-12 DIAGNOSIS — I69351 Hemiplegia and hemiparesis following cerebral infarction affecting right dominant side: Secondary | ICD-10-CM | POA: Diagnosis not present

## 2016-04-12 DIAGNOSIS — Z9181 History of falling: Secondary | ICD-10-CM | POA: Diagnosis not present

## 2016-04-30 DIAGNOSIS — I69351 Hemiplegia and hemiparesis following cerebral infarction affecting right dominant side: Secondary | ICD-10-CM | POA: Diagnosis not present

## 2016-04-30 DIAGNOSIS — Z9181 History of falling: Secondary | ICD-10-CM | POA: Diagnosis not present

## 2016-05-08 DIAGNOSIS — I69351 Hemiplegia and hemiparesis following cerebral infarction affecting right dominant side: Secondary | ICD-10-CM | POA: Diagnosis not present

## 2016-05-08 DIAGNOSIS — Z9181 History of falling: Secondary | ICD-10-CM | POA: Diagnosis not present

## 2016-05-10 DIAGNOSIS — Z9181 History of falling: Secondary | ICD-10-CM | POA: Diagnosis not present

## 2016-05-10 DIAGNOSIS — I69351 Hemiplegia and hemiparesis following cerebral infarction affecting right dominant side: Secondary | ICD-10-CM | POA: Diagnosis not present

## 2016-05-11 DIAGNOSIS — I69351 Hemiplegia and hemiparesis following cerebral infarction affecting right dominant side: Secondary | ICD-10-CM | POA: Diagnosis not present

## 2016-05-11 DIAGNOSIS — Z9181 History of falling: Secondary | ICD-10-CM | POA: Diagnosis not present

## 2016-05-14 DIAGNOSIS — Z9181 History of falling: Secondary | ICD-10-CM | POA: Diagnosis not present

## 2016-05-14 DIAGNOSIS — I69351 Hemiplegia and hemiparesis following cerebral infarction affecting right dominant side: Secondary | ICD-10-CM | POA: Diagnosis not present

## 2016-06-05 DIAGNOSIS — G8190 Hemiplegia, unspecified affecting unspecified side: Secondary | ICD-10-CM | POA: Diagnosis not present

## 2016-06-05 DIAGNOSIS — I638 Other cerebral infarction: Secondary | ICD-10-CM | POA: Diagnosis not present

## 2016-06-05 DIAGNOSIS — I2510 Atherosclerotic heart disease of native coronary artery without angina pectoris: Secondary | ICD-10-CM | POA: Diagnosis not present

## 2016-06-05 DIAGNOSIS — B351 Tinea unguium: Secondary | ICD-10-CM | POA: Diagnosis not present

## 2016-06-05 DIAGNOSIS — Z950 Presence of cardiac pacemaker: Secondary | ICD-10-CM | POA: Diagnosis not present

## 2016-06-05 DIAGNOSIS — Z1389 Encounter for screening for other disorder: Secondary | ICD-10-CM | POA: Diagnosis not present

## 2016-06-05 DIAGNOSIS — Z6825 Body mass index (BMI) 25.0-25.9, adult: Secondary | ICD-10-CM | POA: Diagnosis not present

## 2016-06-05 DIAGNOSIS — R7301 Impaired fasting glucose: Secondary | ICD-10-CM | POA: Diagnosis not present

## 2016-06-05 DIAGNOSIS — E559 Vitamin D deficiency, unspecified: Secondary | ICD-10-CM | POA: Diagnosis not present

## 2016-06-05 DIAGNOSIS — Z23 Encounter for immunization: Secondary | ICD-10-CM | POA: Diagnosis not present

## 2016-06-05 DIAGNOSIS — I480 Paroxysmal atrial fibrillation: Secondary | ICD-10-CM | POA: Diagnosis not present

## 2016-06-11 DIAGNOSIS — B351 Tinea unguium: Secondary | ICD-10-CM | POA: Diagnosis not present

## 2016-06-11 DIAGNOSIS — L739 Follicular disorder, unspecified: Secondary | ICD-10-CM | POA: Diagnosis not present

## 2016-06-11 DIAGNOSIS — L84 Corns and callosities: Secondary | ICD-10-CM | POA: Diagnosis not present

## 2016-07-23 ENCOUNTER — Encounter: Payer: Self-pay | Admitting: Internal Medicine

## 2016-07-27 ENCOUNTER — Encounter: Payer: Self-pay | Admitting: Internal Medicine

## 2016-08-02 ENCOUNTER — Encounter: Payer: Self-pay | Admitting: Internal Medicine

## 2016-08-13 ENCOUNTER — Encounter: Payer: Self-pay | Admitting: Internal Medicine

## 2016-09-05 DIAGNOSIS — B351 Tinea unguium: Secondary | ICD-10-CM | POA: Diagnosis not present

## 2016-09-05 DIAGNOSIS — L84 Corns and callosities: Secondary | ICD-10-CM | POA: Diagnosis not present

## 2016-10-17 ENCOUNTER — Encounter: Payer: Self-pay | Admitting: Internal Medicine

## 2016-10-22 NOTE — Progress Notes (Signed)
Cardiology Office Note Date:  10/23/2016  Patient ID:  Frank Weeks, DOB 18-Jul-1929, MRN [id number] PCP:  Jerlyn Ly, MD  Cardiologist:  Dr. Percival Spanish Electrophysiologist: Dr. Caryl Comes   Chief Complaint:  Pacemaker check  History of Present Illness: Frank Weeks is a 83 y.o. male with history of paroxysmal AFib, historically patient had refused coumadin and not felt a/c candidate with unsteady gait and falls, CAD (CABG 2007), [place], HLD, high degree AVBlock w/PPM, stroke, PVD with L carotid  Bypass, remote SAH 2/2 aneurysm (clipped).  He was last seen by cardiology service in May 2014 hospitalization with acute CVA and started on a/c with Eliquis at that time.  He comes in today to be seen for Dr. Caryl Comes.  He lives at [facility] comes accompanied by his wife.    He was changed to pradaxa secondary to better coverage financially.  He is generally inactive, intermittently will get PT when medicare funds are available but not routinely.  He denies any CP, palpitations or SOB, no near syncope or syncope.  They denies any bleeding or signs of bleeding. The patient's wife remports that his cholesterol; is great since his stroke as well as his BP and he sees his PMD routinely and has labs done routinely as well   The patient's wife mentions that he carries a DNR status since his stroke.    Device information: SJM dual chamber PPM, implanted 03/23/09, Dr. Caryl Comes.  Past Medical History:  Diagnosis Date  . Bradycardia    a. s/p PPM.  . Cerebrovascular disease    a. s/p R CEA;  b. 02/2013 Carotid U/S: no signif ICA stenosis.  . Coronary artery disease    a. 2007 CABGx3: LIMA->LAD, VG->RI, VG->RCA;  b. 2010 [place] MV: nonischemic.  Marland Kitchen Dyslipidemia   . GERD (gastroesophageal reflux disease)   . Glaucoma   . HTN (hypertension)   . PAF (paroxysmal atrial fibrillation) ([place])    a. previously refused coumadin and felt to be poor coumadin candidate 2/2 falls/unsteady gait;  b. 02/2013 Echo: EF 55-60%, mild  to mod AS.  Marland Kitchen Pleural effusion   . PVD (peripheral vascular disease) ([place])   . Stroke [facility])    a. first @ age 34->memory deficits;  b. 0000000 embolic stroke - left basal ganglia.  . Subarachnoid hemorrhage ([place])    a. aneurysmal 1964, s/p clipping.    Past Surgical History:  Procedure Laterality Date  . CORONARY ARTERY BYPASS GRAFT  03/18/2006  . dual-chamber pacemaker implantation    . left carotid to vertebral bypass    . right carotid endarectomy  09/2004  . thoracentesis and bronchoscopy      Current Outpatient Prescriptions  Medication Sig Dispense Refill  . dabigatran (PRADAXA) 150 MG CAPS capsule Take 150 mg by mouth 2 (two) times daily.    Marland Kitchen latanoprost (XALATAN) 0.005 % ophthalmic solution Place 1 drop into both eyes at bedtime.    Marland Kitchen loperamide (IMODIUM) 2 MG capsule Take by mouth as needed for diarrhea or loose stools.    . nitroGLYCERIN (NITROSTAT) 0.4 MG SL tablet Place 0.4 mg under the tongue every 5 (five) minutes as needed for chest pain.    Marland Kitchen omeprazole (PRILOSEC) 20 MG capsule Take 20 mg by mouth daily.    . sertraline (ZOLOFT) 50 MG tablet Take 50 mg by mouth daily.     No current facility-administered medications for this visit.     Allergies:   Advicor [niacin-lovastatin er]; Lipitor [atorvastatin]; Niacin and  related; Plavix [clopidogrel]; and Zocor [simvastatin]   Social History:  The patient  reports that he quit smoking about 65 years ago. He has never used smokeless tobacco. He reports that he does not drink alcohol or use drugs.   Family History:  The patient's family history includes Heart attack in his father and mother; Lung cancer in his mother.  ROS:  Please see the history of present illness.    All other systems are reviewed and otherwise negative.   PHYSICAL EXAM:  VS:  BP 128/80   Pulse 70   Ht 5\' 8"  (1.727 m)   Wt 160 lb (72.6 kg)   BMI 24.33 kg/m  BMI: Body mass index is 24.33 kg/m. Well nourished, well developed, in no acute distress    HEENT: normocephalic, atraumatic  Neck: no JVD, carotid bruits or masses Cardiac: RRR; 2/6SM, no rubs, or gallops Lungs:  clear to auscultation bilaterally, no wheezing, rhonchi or rales  Abd: soft, nontender MS: no deformity or atrophy Ext: no  edema  Skin: warm and dry, no rash Neuro:  No gross deficits appreciated Psych: euthymic mood, full affect, R hemiparesis, essentially aphasic  PPM site is stable, no tethering or discomfort   EKG:  Done today and reviewed by myself is AFib, V paced. PPM interrogation done today and reviewed by myself: stable battery and lead status, in AFib, escape rate in the 40's today.  He has been in [place] since November, suspect some undersensing of AF, reprogrammed AT detection to 130bpm and shortened PVAB to 26ms ion effort to have better diagnostics.  03/09/13: TTE Study Conclusions - Left ventricle: The cavity size was normal. Wall thickness was increased in a pattern of mild LVH. Systolic function was normal. The estimated ejection fraction was in the range of 55% to 60%. - Aortic valve: There was mild to moderate stenosis. Valve area: 0.8cm^2(VTI). Valve area: 0.83cm^2 (Vmax). 09/18/04: Lexiscan stress IMPRESSION:   Normal study demonstrating no evidence of ischemia.   Recent Labs: No results found for requested labs within last 8760 hours.  No results found for requested labs within last 8760 hours.   CrCl cannot be calculated (Patient's most recent lab result is older than the maximum 21 days allowed.).   Wt Readings from Last 3 Encounters:  10/23/16 160 lb (72.6 kg)  03/26/13 171 lb 8.3 oz (77.8 kg)  03/09/13 165 lb (74.8 kg)     Other studies reviewed: Additional studies/records reviewed today include: summarized above  ASSESSMENT AND PLAN:  1. Heart block w/PPM     Stable pacer function, reprogrammed as above  2. CAD     no c/o CP  3. HTN     Stable  4. Persistent AFib     Asymptomatic, essentially wheelchair  bound requires full assistance since his CVA     CHA2DS2Vasc is at least 7, on Pradaxa  5. VHD     At least moderate AS    asymptomatic   Given advanced age, would consider an alternative to Pradaxa, as well as a statin given his CAD though the patient's wife is very reluctant to consider any changes or additional medicines, states his PMD monitors him closely and he is doing well on this regime.   Disposition: F/u with device clinic in 6 months for pacer check (they decline remote), and 1 year for MD/APP EP visit, sooner if needed.  Current medicines are reviewed at length with the patient today.  The patient did not have any concerns regarding  medicines.  Haywood Lasso, PA-C 10/23/2016 11:25 AM     [address] [address] [phone] (office)  [phone] (fax)

## 2016-10-23 ENCOUNTER — Ambulatory Visit (INDEPENDENT_AMBULATORY_CARE_PROVIDER_SITE_OTHER): Payer: Medicare Other | Admitting: Physician Assistant

## 2016-10-23 ENCOUNTER — Encounter (INDEPENDENT_AMBULATORY_CARE_PROVIDER_SITE_OTHER): Payer: Self-pay

## 2016-10-23 VITALS — BP 128/80 | HR 70 | Ht 68.0 in | Wt 160.0 lb

## 2016-10-23 DIAGNOSIS — I350 Nonrheumatic aortic (valve) stenosis: Secondary | ICD-10-CM

## 2016-10-23 DIAGNOSIS — I4430 Unspecified atrioventricular block: Secondary | ICD-10-CM | POA: Diagnosis not present

## 2016-10-23 DIAGNOSIS — I2510 Atherosclerotic heart disease of native coronary artery without angina pectoris: Secondary | ICD-10-CM | POA: Diagnosis not present

## 2016-10-23 DIAGNOSIS — I10 Essential (primary) hypertension: Secondary | ICD-10-CM

## 2016-10-23 DIAGNOSIS — I481 Persistent atrial fibrillation: Secondary | ICD-10-CM

## 2016-10-23 DIAGNOSIS — I4819 Other persistent atrial fibrillation: Secondary | ICD-10-CM

## 2016-10-23 NOTE — Patient Instructions (Signed)
Medication Instructions:   Your physician recommends that you continue on your current medications as directed. Please refer to the Current Medication list given to you today.    If you need a refill on your cardiac medications before your next appointment, please call your pharmacy.  Labwork: NONE ORDERED  TODAY    Testing/Procedures: NONE ORDERED  TODAY    Follow-Up: Your physician wants you to follow-up in: [place] will receive a reminder letter in the mail two months in advance. If you don't receive a letter, please call our office to schedule the follow-up appointment.  Your physician wants you to follow-up in:  [place] will receive a reminder letter in the mail two months in advance. If you don't receive a letter, please call our office to schedule the follow-up appointment.      Any Other Special Instructions Will Be Listed Below (If Applicable).

## 2016-10-30 DIAGNOSIS — M2041 Other hammer toe(s) (acquired), right foot: Secondary | ICD-10-CM | POA: Diagnosis not present

## 2016-10-30 DIAGNOSIS — M71571 Other bursitis, not elsewhere classified, right ankle and foot: Secondary | ICD-10-CM | POA: Diagnosis not present

## 2016-10-30 DIAGNOSIS — M792 Neuralgia and neuritis, unspecified: Secondary | ICD-10-CM | POA: Diagnosis not present

## 2016-12-03 DIAGNOSIS — B351 Tinea unguium: Secondary | ICD-10-CM | POA: Diagnosis not present

## 2016-12-03 DIAGNOSIS — L739 Follicular disorder, unspecified: Secondary | ICD-10-CM | POA: Diagnosis not present

## 2016-12-03 DIAGNOSIS — L84 Corns and callosities: Secondary | ICD-10-CM | POA: Diagnosis not present

## 2017-01-30 DIAGNOSIS — M6281 Muscle weakness (generalized): Secondary | ICD-10-CM | POA: Diagnosis not present

## 2017-02-01 DIAGNOSIS — M6281 Muscle weakness (generalized): Secondary | ICD-10-CM | POA: Diagnosis not present

## 2017-02-06 DIAGNOSIS — M6281 Muscle weakness (generalized): Secondary | ICD-10-CM | POA: Diagnosis not present

## 2017-02-08 DIAGNOSIS — M6281 Muscle weakness (generalized): Secondary | ICD-10-CM | POA: Diagnosis not present

## 2017-02-11 DIAGNOSIS — M6281 Muscle weakness (generalized): Secondary | ICD-10-CM | POA: Diagnosis not present

## 2017-02-13 DIAGNOSIS — M6281 Muscle weakness (generalized): Secondary | ICD-10-CM | POA: Diagnosis not present

## 2017-02-14 DIAGNOSIS — M6281 Muscle weakness (generalized): Secondary | ICD-10-CM | POA: Diagnosis not present

## 2017-02-20 DIAGNOSIS — M6281 Muscle weakness (generalized): Secondary | ICD-10-CM | POA: Diagnosis not present

## 2017-02-22 DIAGNOSIS — M6281 Muscle weakness (generalized): Secondary | ICD-10-CM | POA: Diagnosis not present

## 2017-02-25 DIAGNOSIS — M6281 Muscle weakness (generalized): Secondary | ICD-10-CM | POA: Diagnosis not present

## 2017-02-26 DIAGNOSIS — M6281 Muscle weakness (generalized): Secondary | ICD-10-CM | POA: Diagnosis not present

## 2017-03-04 DIAGNOSIS — B351 Tinea unguium: Secondary | ICD-10-CM | POA: Diagnosis not present

## 2017-03-04 DIAGNOSIS — M6281 Muscle weakness (generalized): Secondary | ICD-10-CM | POA: Diagnosis not present

## 2017-03-04 DIAGNOSIS — L739 Follicular disorder, unspecified: Secondary | ICD-10-CM | POA: Diagnosis not present

## 2017-03-04 DIAGNOSIS — L84 Corns and callosities: Secondary | ICD-10-CM | POA: Diagnosis not present

## 2017-03-06 DIAGNOSIS — M6281 Muscle weakness (generalized): Secondary | ICD-10-CM | POA: Diagnosis not present

## 2017-03-08 DIAGNOSIS — M6281 Muscle weakness (generalized): Secondary | ICD-10-CM | POA: Diagnosis not present

## 2017-03-13 DIAGNOSIS — M6281 Muscle weakness (generalized): Secondary | ICD-10-CM | POA: Diagnosis not present

## 2017-03-14 DIAGNOSIS — M6281 Muscle weakness (generalized): Secondary | ICD-10-CM | POA: Diagnosis not present

## 2017-05-27 DIAGNOSIS — B351 Tinea unguium: Secondary | ICD-10-CM | POA: Diagnosis not present

## 2017-05-27 DIAGNOSIS — L84 Corns and callosities: Secondary | ICD-10-CM | POA: Diagnosis not present

## 2017-05-27 DIAGNOSIS — L739 Follicular disorder, unspecified: Secondary | ICD-10-CM | POA: Diagnosis not present

## 2017-08-01 DIAGNOSIS — Z1389 Encounter for screening for other disorder: Secondary | ICD-10-CM | POA: Diagnosis not present

## 2017-08-01 DIAGNOSIS — Z23 Encounter for immunization: Secondary | ICD-10-CM | POA: Diagnosis not present

## 2017-08-01 DIAGNOSIS — I69959 Hemiplegia and hemiparesis following unspecified cerebrovascular disease affecting unspecified side: Secondary | ICD-10-CM | POA: Diagnosis not present

## 2017-08-01 DIAGNOSIS — R7301 Impaired fasting glucose: Secondary | ICD-10-CM | POA: Diagnosis not present

## 2017-08-01 DIAGNOSIS — I480 Paroxysmal atrial fibrillation: Secondary | ICD-10-CM | POA: Diagnosis not present

## 2017-08-01 DIAGNOSIS — I6381 Other cerebral infarction due to occlusion or stenosis of small artery: Secondary | ICD-10-CM | POA: Diagnosis not present

## 2017-08-01 DIAGNOSIS — E559 Vitamin D deficiency, unspecified: Secondary | ICD-10-CM | POA: Diagnosis not present

## 2017-08-01 DIAGNOSIS — I10 Essential (primary) hypertension: Secondary | ICD-10-CM | POA: Diagnosis not present

## 2017-08-01 DIAGNOSIS — Z950 Presence of cardiac pacemaker: Secondary | ICD-10-CM | POA: Diagnosis not present

## 2017-11-27 DIAGNOSIS — B351 Tinea unguium: Secondary | ICD-10-CM | POA: Diagnosis not present

## 2017-11-27 DIAGNOSIS — L84 Corns and callosities: Secondary | ICD-10-CM | POA: Diagnosis not present

## 2017-11-27 DIAGNOSIS — L739 Follicular disorder, unspecified: Secondary | ICD-10-CM | POA: Diagnosis not present

## 2017-12-10 ENCOUNTER — Telehealth: Payer: Self-pay | Admitting: Internal Medicine

## 2017-12-10 NOTE — Telephone Encounter (Signed)
New message: ° ° ° ° ° °Patient calling the office for samples of medication: ° ° °1.  What medication and dosage are you requesting samples for? Xarelto ° °2.  Are you currently out of this medication? Yes ° ° °

## 2017-12-11 NOTE — Telephone Encounter (Signed)
Samples of Xarelto placed at front desk for pt

## 2018-02-10 ENCOUNTER — Non-Acute Institutional Stay: Payer: Medicare HMO | Admitting: Hospice and Palliative Medicine

## 2018-02-10 ENCOUNTER — Encounter: Payer: Self-pay | Admitting: Hospice and Palliative Medicine

## 2018-02-10 DIAGNOSIS — I739 Peripheral vascular disease, unspecified: Secondary | ICD-10-CM | POA: Diagnosis not present

## 2018-02-10 DIAGNOSIS — I5031 Acute diastolic (congestive) heart failure: Secondary | ICD-10-CM

## 2018-02-10 DIAGNOSIS — I481 Persistent atrial fibrillation: Secondary | ICD-10-CM | POA: Diagnosis not present

## 2018-02-10 DIAGNOSIS — I4819 Other persistent atrial fibrillation: Secondary | ICD-10-CM

## 2018-02-10 DIAGNOSIS — Z515 Encounter for palliative care: Secondary | ICD-10-CM | POA: Insufficient documentation

## 2018-02-10 NOTE — Progress Notes (Signed)
INITIAL PALLIATIVE CARE CONSULT VISIT   PATIENT NAME: Frank Weeks DOB: 1928/11/30 MRN: [id number]  PRIMARY CARE PROVIDER: Crist Infante, MD  REFERRING PROVIDER: Crist Infante, MD [place], [place] [address]  RESPONSIBLE PARTY:  Strum Phone Work Phone Relationship Acct Type  [id number] TAHEEM, FRICKE [phone]  Self P/F     [address] [place], [place],  [address]   BILLABLE ICD-10: WEAKNESS R53.1 Rebecca: wife, Earlie Server [phone] PRIMARY CAREGIVER: Fawn Kirk AL    RECOMMENDATIONS and PLAN:  1. Weakness: secondary to CVA with right sided weakness and weight loss per patient's wife. Facility reports no weight loss. Patient with worsened memory and dysarthria and shaking head "no" when asked if he feels well. He still eats two meals a day, well. He feeds himself with his left hand. He can only speak one or two words clearly. He sleeps > 18 hours a day. PT/OT/ST have maxed out their benefit. Discuss with PCP and Hospice physicians if hospice eligible. No recent infections or hospitalizations. Spouse reports that he was with hospice before two years ago. Not certain with which agency? He requires two person assist to transfer from [facility] to bed. Will follow up for hospice eligibility.  2. ACP: DNR status. Competed MOST form indicating comfort measures only. Wife receives samples of Xarelto and would need this continued for his "survival."   I spent 60 minutes providing this consultation,  from 1:00 pm to 2:00 pm.. More than 50% of the time in this consultation was spent coordinating communication.    HISTORY OF PRESENT ILLNESS: Frank Weeks is a 84 y.o. year old male with history of CVA who has declined in the last week per his wife and she was requesting a palliative care consult to determine if he is hospice eligible.  Received order from Dr. Joylene Draft.    CODE STATUS: DNR  ADVANCED DIRECTIVES: Y PPS: 30% HOSPICE ELIGIBILITY/DIAGNOSIS:  TBD  PAST MEDICAL HISTORY:  Past Medical History:  Diagnosis Date  . Bradycardia    a. s/p PPM.  . Cerebrovascular disease    a. s/p R CEA;  b. 02/2013 Carotid U/S: no signif ICA stenosis.  . Coronary artery disease    a. 2007 CABGx3: LIMA->LAD, VG->RI, VG->RCA;  b. 2010 [place] MV: nonischemic.  Marland Kitchen Dyslipidemia   . GERD (gastroesophageal reflux disease)   . Glaucoma   . HTN (hypertension)   . PAF (paroxysmal atrial fibrillation) ([place])    a. previously refused coumadin and felt to be poor coumadin candidate 2/2 falls/unsteady gait;  b. 02/2013 Echo: EF 55-60%, mild to mod AS.  Marland Kitchen Pleural effusion   . PVD (peripheral vascular disease) ([place])   . Stroke [facility])    a. first @ age 59->memory deficits;  b. 10/6999 embolic stroke - left basal ganglia.  . Subarachnoid hemorrhage ([place])    a. aneurysmal 1964, s/p clipping.    SOCIAL HX:  Social History   Tobacco Use  . Smoking status: Former Smoker    Last attempt to quit: 02/27/1951    Years since quitting: 67.0  . Smokeless tobacco: Never Used  Substance Use Topics  . Alcohol use: No    ALLERGIES:  Allergies  Allergen Reactions  . Advicor [Niacin-Lovastatin Er]   . Lipitor [Atorvastatin] Other (See Comments)    Joints sore.   . Niacin And Related   . Plavix [Clopidogrel]   . Zocor [Simvastatin]       PERTINENT MEDICATIONS:  REVIEWED  PHYSICAL EXAM:   Wt 161#  GEN: chronically ill; Tired and weak LUNGS: CTAB CARDIAC:  RRR; +murmur; + PM ABD: Rounded; soft; NTTP; + BS  EXTREMITIES: +right sided paralysis; thin extremities; no ederm SKIN: Pale and intact  NEURO: arouses easily but falls back to sleep; follows commands but confuses yes, no responses; +dysarthia; + memory loss; confuses easily     Nathanial Rancher, NP

## 2018-02-17 ENCOUNTER — Telehealth: Payer: Self-pay

## 2018-02-17 NOTE — Telephone Encounter (Signed)
Spoke with Pt's wife who is requesting samples of xarelto. Informed wife the medication list doesn't reflect he is currently taking it. Wife requesting to speak with Nya Dr. Percival Spanish 's primary nurse. Routed

## 2018-02-20 NOTE — Telephone Encounter (Signed)
Samples of Xarelto left at front desk for pt to pick up

## 2018-03-15 DIAGNOSIS — 419620001 Death: Secondary | SNOMED CT | POA: Diagnosis not present

## 2018-03-15 DEATH — deceased
